# Patient Record
Sex: Female | Born: 1990 | Race: White | Hispanic: No | Marital: Married | Smoking: Never smoker
Health system: Southern US, Community
[De-identification: ages and names within clinical notes are randomized; demographics above are authoritative.]

## PROBLEM LIST (undated history)

## (undated) ENCOUNTER — Inpatient Hospital Stay: Payer: Self-pay

## (undated) DIAGNOSIS — J45909 Unspecified asthma, uncomplicated: Secondary | ICD-10-CM

## (undated) DIAGNOSIS — Z8614 Personal history of Methicillin resistant Staphylococcus aureus infection: Secondary | ICD-10-CM

## (undated) DIAGNOSIS — R112 Nausea with vomiting, unspecified: Secondary | ICD-10-CM

## (undated) DIAGNOSIS — D649 Anemia, unspecified: Secondary | ICD-10-CM

## (undated) DIAGNOSIS — R87629 Unspecified abnormal cytological findings in specimens from vagina: Secondary | ICD-10-CM

## (undated) DIAGNOSIS — Z9889 Other specified postprocedural states: Secondary | ICD-10-CM

## (undated) HISTORY — DX: Unspecified abnormal cytological findings in specimens from vagina: R87.629

## (undated) HISTORY — DX: Anemia, unspecified: D64.9

---

## 2011-02-12 ENCOUNTER — Emergency Department: Payer: Self-pay | Admitting: Unknown Physician Specialty

## 2012-04-28 ENCOUNTER — Emergency Department: Payer: Self-pay | Admitting: Emergency Medicine

## 2012-04-28 LAB — CBC
HGB: 12.3 g/dL (ref 12.0–16.0)
MCH: 25.7 pg — ABNORMAL LOW (ref 26.0–34.0)
MCHC: 32.1 g/dL (ref 32.0–36.0)
MCV: 80 fL (ref 80–100)
Platelet: 327 10*3/uL (ref 150–440)
RBC: 4.8 10*6/uL (ref 3.80–5.20)
RDW: 14 % (ref 11.5–14.5)

## 2012-04-28 LAB — URINALYSIS, COMPLETE
Bacteria: NONE SEEN
Bilirubin,UR: NEGATIVE
Glucose,UR: NEGATIVE mg/dL (ref 0–75)
Ketone: NEGATIVE
Leukocyte Esterase: NEGATIVE
Ph: 8 (ref 4.5–8.0)
RBC,UR: <1 /HPF (ref 0–5)
Squamous Epithelial: 1
WBC UR: NONE SEEN /HPF (ref 0–5)

## 2012-04-28 LAB — BASIC METABOLIC PANEL
Anion Gap: 7 (ref 7–16)
BUN: 8 mg/dL (ref 7–18)
Co2: 29 mmol/L (ref 21–32)
EGFR (Non-African Amer.): >60
Glucose: 82 mg/dL (ref 65–99)
Potassium: 4.1 mmol/L (ref 3.5–5.1)
Sodium: 142 mmol/L (ref 136–145)

## 2012-04-28 LAB — PREGNANCY, URINE: Pregnancy Test, Urine: NEGATIVE m[IU]/mL

## 2013-06-09 ENCOUNTER — Emergency Department: Payer: Self-pay | Admitting: Internal Medicine

## 2013-06-09 LAB — URINALYSIS, COMPLETE
Bacteria: NONE SEEN
Bilirubin,UR: NEGATIVE
Ketone: NEGATIVE
Nitrite: NEGATIVE
Ph: 5 (ref 4.5–8.0)
RBC,UR: 1 /HPF (ref 0–5)
WBC UR: 4 /HPF (ref 0–5)

## 2013-06-09 LAB — GC/CHLAMYDIA PROBE AMP

## 2013-06-09 LAB — WET PREP, GENITAL

## 2016-09-17 ENCOUNTER — Emergency Department
Admission: EM | Admit: 2016-09-17 | Discharge: 2016-09-17 | Disposition: A | Discharge status: Home / Self Care | Payer: Self-pay | Attending: Emergency Medicine | Admitting: Emergency Medicine

## 2016-09-17 ENCOUNTER — Encounter: Payer: Self-pay | Admitting: Emergency Medicine

## 2016-09-17 DIAGNOSIS — N898 Other specified noninflammatory disorders of vagina: Secondary | ICD-10-CM | POA: Insufficient documentation

## 2016-09-17 DIAGNOSIS — L298 Other pruritus: Secondary | ICD-10-CM | POA: Insufficient documentation

## 2016-09-17 DIAGNOSIS — Z7722 Contact with and (suspected) exposure to environmental tobacco smoke (acute) (chronic): Secondary | ICD-10-CM | POA: Insufficient documentation

## 2016-09-17 DIAGNOSIS — R300 Dysuria: Secondary | ICD-10-CM | POA: Insufficient documentation

## 2016-09-17 LAB — WET PREP, GENITAL
Clue Cells Wet Prep HPF POC: NONE SEEN
Sperm: NONE SEEN
Trich, Wet Prep: NONE SEEN
Yeast Wet Prep HPF POC: NONE SEEN

## 2016-09-17 LAB — URINALYSIS, COMPLETE (UACMP) WITH MICROSCOPIC
Bilirubin Urine: NEGATIVE
Glucose, UA: NEGATIVE mg/dL
Hgb urine dipstick: NEGATIVE
Ketones, ur: NEGATIVE mg/dL
Nitrite: NEGATIVE
Protein, ur: 30 mg/dL — AB
Specific Gravity, Urine: 1.021 (ref 1.005–1.030)
pH: 7 (ref 5.0–8.0)

## 2016-09-17 LAB — PREGNANCY, URINE: Preg Test, Ur: NEGATIVE

## 2016-09-17 MED ORDER — CEPHALEXIN 500 MG PO CAPS: 500.0000 mg | ORAL_CAPSULE | Freq: Four times a day (QID) | ORAL | 0 refills | Status: AC

## 2016-09-17 MED ORDER — FLUCONAZOLE 50 MG PO TABS
150.0000 mg | ORAL_TABLET | Freq: Once | ORAL | Status: AC
  Administered 2016-09-17: 22:00:00 150 mg via ORAL
  Filled 2016-09-17: qty 1

## 2016-09-17 NOTE — ED Notes (Signed)
Patient c/o thick white vaginal discharge and pain

## 2016-09-17 NOTE — ED Notes (Signed)
Reviewed d/c instructions, follow-up care, prescription with patient. Pt verbalized understanding.  

## 2016-09-17 NOTE — ED Provider Notes (Signed)
[facility] Emergency Department Provider Note  ____________________________________________  Time seen: Approximately 8:27 PM  I have reviewed the triage vital signs and the nursing notes.   HISTORY  Chief Complaint Vaginal Discharge    HPI Rebecca Walton is a 26 y.o. female presenting to the emergency department with thick, white vaginal discharge and vaginal pruritus for one day. Patient has also had dysuria but no increased urinary frequency or hematuria. Patient states that she was treated for a STD in the past. However, she is unsure if she actually tested positive. Patient states that she is in a monogamous relationship with one sexual partner now. She denies a history of pyelonephritis or nephrolithiasis. She denies bilateral flank pain and dyspareunia. No alleviating measures have been attempted. Patient states "I do not want to be tested for STDs". Patient denies chest pain, chest tightness, shortness of breath, abdominal pain, nausea and vomiting.    History reviewed. No pertinent past medical history.  There are no active problems to display for this patient.   History reviewed. No pertinent surgical history.  Prior to Admission medications   Medication Sig Start Date End Date Taking? Authorizing Provider  cephALEXin (KEFLEX) 500 MG capsule Take 1 capsule (500 mg total) by mouth 4 (four) times daily. 09/17/16 09/27/16  Orvil FeilJaclyn M Audreana Hancox, PA-C    Allergies Patient has no known allergies.  History reviewed. No pertinent family history.  Social History Social History  Substance Use Topics  . Smoking status: Passive Smoke Exposure - Never Smoker  . Smokeless tobacco: Never Used  . Alcohol use No     Review of Systems  Constitutional: No fever/chills Eyes: No visual changes. No discharge ENT: No upper respiratory complaints. Cardiovascular: no chest pain. Respiratory: no cough. No SOB. Gastrointestinal: No abdominal pain.  No nausea, no  vomiting.  No diarrhea.  No constipation. Genitourinary: Patient has increased vaginal discharge and dysuria. Musculoskeletal: Negative for musculoskeletal pain. Skin: Negative for rash, abrasions, lacerations, ecchymosis. Neurological: Negative for headaches, focal weakness or numbness. ____________________________________________   PHYSICAL EXAM:  VITAL SIGNS: ED Triage Vitals [09/17/16 1805]  Enc Vitals Group     BP (!) 142/70     Pulse Rate 69     Resp 18     Temp 98.8 F (37.1 C)     Temp Source Oral     SpO2 100 %     Weight 180 lb (81.6 kg)     Height 5' (1.524 m)     Head Circumference      Peak Flow      Pain Score 8     Pain Loc      Pain Edu?      Excl. in GC?      Constitutional: Alert and oriented. Well appearing and in no acute distress. Cardiovascular: Normal rate, regular rhythm. Normal S1 and S2.  Good peripheral circulation. Respiratory: Normal respiratory effort without tachypnea or retractions. Lungs CTAB. Good air entry to the bases with no decreased or absent breath sounds. Gastrointestinal: Bowel sounds 4 quadrants. Soft and nontender to palpation. No guarding or rigidity. No palpable masses. No distention. No CVA tenderness. Genital: Patient has erythema, excoriations and satellite lesions of the labia majora. Thick, white discharge is visible within the vaginal vault. No masses were palpated with bimanual exam. No cervical motion tenderness was elicited. Musculoskeletal: Full range of motion to all extremities. No gross deformities appreciated. Neurologic:  Normal speech and language. No gross focal neurologic deficits are appreciated.  Skin:  Skin is warm, dry and intact. No rash noted. Psychiatric: Mood and affect are normal. Speech and behavior are normal. Patient exhibits appropriate insight and judgement. ____________________________________________   LABS (all labs ordered are listed, but only abnormal results are displayed)  Labs Reviewed   WET PREP, GENITAL - Abnormal; Notable for the following:       Result Value   WBC, Wet Prep HPF POC RARE (*)    All other components within normal limits  URINALYSIS, COMPLETE (UACMP) WITH MICROSCOPIC - Abnormal; Notable for the following:    Color, Urine YELLOW (*)    APPearance HAZY (*)    Protein, ur 30 (*)    Leukocytes, UA SMALL (*)    Bacteria, UA RARE (*)    Squamous Epithelial / LPF 6-30 (*)    All other components within normal limits  PREGNANCY, URINE   ____________________________________________  EKG   ____________________________________________  RADIOLOGY   No results found.  ____________________________________________    PROCEDURES  Procedure(s) performed:    Procedures    Medications  fluconazole (DIFLUCAN) tablet 150 mg (150 mg Oral Given 09/17/16 2141)     ____________________________________________   INITIAL IMPRESSION / ASSESSMENT AND PLAN / ED COURSE  Pertinent labs & imaging results that were available during my care of the patient were reviewed by me and considered in my medical decision making (see chart for details).  Review of the [place] CSRS was performed in accordance of the [facility] prior to dispensing any controlled drugs.     Assessment and Plan:  Dysuria Vaginal Itching Patient presents to the emergency department with increased white vaginal discharge, vaginal pruritus and dysuria. Wet prep was noncontributory for vaginal candidiasis or bacterial vaginosis. On physical exam, patient has erythema and excoriations of the labia major with satellite lesions and with white vaginal discharge, findings consistent with vaginal candidiasis. Patient was treated empirically with Diflucan in the emergency department. Patient was also treated empirically for a urinary tract infection. Patient was advised to follow-up with the health department for appropriate STDs testing. Patient declined gonorrhea and chlamydia testing in the emergency  department tonight. Patient declined empiric treatment for gonorrhea, chlamydia and trichomoniasis tonight. All patient questions were answered. Vital signs are reassuring at this time. ____________________________________________  FINAL CLINICAL IMPRESSION(S) / ED DIAGNOSES  Final diagnoses:  Dysuria  Vaginal itching      NEW MEDICATIONS STARTED DURING THIS VISIT:  Discharge Medication List as of 09/17/2016  9:33 PM    START taking these medications   Details  cephALEXin (KEFLEX) 500 MG capsule Take 1 capsule (500 mg total) by mouth 4 (four) times daily., Starting Sat 09/17/2016, Until Tue 09/27/2016, Print            This chart was dictated using voice recognition software/Dragon. Despite best efforts to proofread, errors can occur which can change the meaning. Any change was purely unintentional.    Orvil Feil, PA-C 09/17/16 2232    Merrily Brittle, MD 09/17/16 2350

## 2016-09-17 NOTE — ED Triage Notes (Signed)
Pt states she has a bacterial infection in her vagina. Reports itching and discharged. Started about 2 days ago.

## 2016-11-25 ENCOUNTER — Other Ambulatory Visit: Payer: Self-pay | Admitting: Nurse Practitioner

## 2016-11-25 DIAGNOSIS — Z369 Encounter for antenatal screening, unspecified: Secondary | ICD-10-CM

## 2016-11-25 LAB — OB RESULTS CONSOLE HIV ANTIBODY (ROUTINE TESTING): HIV: NONREACTIVE

## 2016-11-26 LAB — OB RESULTS CONSOLE VARICELLA ZOSTER ANTIBODY, IGG: Varicella: IMMUNE

## 2016-11-26 LAB — OB RESULTS CONSOLE HEPATITIS B SURFACE ANTIGEN: Hepatitis B Surface Ag: NEGATIVE

## 2016-11-26 LAB — OB RESULTS CONSOLE RUBELLA ANTIBODY, IGM: Rubella: IMMUNE

## 2016-11-26 LAB — OB RESULTS CONSOLE RPR: RPR: NONREACTIVE

## 2016-12-08 ENCOUNTER — Ambulatory Visit
Admission: RE | Admit: 2016-12-08 | Discharge: 2016-12-08 | Disposition: A | Discharge status: Home / Self Care | Payer: Self-pay | Source: Ambulatory Visit | Attending: Obstetrics and Gynecology | Admitting: Obstetrics and Gynecology

## 2016-12-08 ENCOUNTER — Ambulatory Visit
Admission: RE | Admit: 2016-12-08 | Discharge: 2016-12-08 | Disposition: A | Discharge status: Home / Self Care | Payer: Medicaid Other | Source: Ambulatory Visit | Attending: Obstetrics and Gynecology | Admitting: Obstetrics and Gynecology

## 2016-12-08 ENCOUNTER — Encounter: Payer: Self-pay | Admitting: *Deleted

## 2016-12-08 VITALS — BP 118/73 | HR 84 | Temp 98.5°F | Resp 18 | Ht 60.0 in | Wt 176.8 lb

## 2016-12-08 DIAGNOSIS — Z369 Encounter for antenatal screening, unspecified: Secondary | ICD-10-CM

## 2016-12-08 DIAGNOSIS — Z3A12 12 weeks gestation of pregnancy: Secondary | ICD-10-CM | POA: Insufficient documentation

## 2016-12-08 DIAGNOSIS — Z3682 Encounter for antenatal screening for nuchal translucency: Secondary | ICD-10-CM | POA: Diagnosis not present

## 2016-12-08 NOTE — Progress Notes (Addendum)
Referring physician:  [facility] Department Length of Consultation: 45 minutes   [name]  was referred to Rebecca Walton for genetic counseling to review prenatal screening and testing options.  This note summarizes the information we discussed.    We offered the following routine screening tests for this pregnancy:  First trimester screening, which includes nuchal translucency ultrasound screen and first trimester maternal serum marker screening.  The nuchal translucency has approximately an 80% detection rate for Down syndrome and can be positive for other chromosome abnormalities as well as congenital heart defects.  When combined with a maternal serum marker screening, the detection rate is up to 90% for Down syndrome and up to 97% for trisomy 18.     Maternal serum marker screening, a blood test that measures pregnancy proteins, can provide risk assessments for Down syndrome, trisomy 18, and open neural tube defects (spina bifida, anencephaly). Because it does not directly examine the fetus, it cannot positively diagnose or rule out these problems.  Targeted ultrasound uses high frequency sound waves to create an image of the developing fetus.  An ultrasound is often recommended as a routine means of evaluating the pregnancy.  It is also used to screen for fetal anatomy problems (for example, a heart defect) that might be suggestive of a chromosomal or other abnormality.   Should these screening tests indicate an increased concern, then the following additional testing options would be offered:  The chorionic villus sampling procedure is available for first trimester chromosome analysis.  This involves the withdrawal of a small amount of chorionic villi (tissue from the developing placenta).  Risk of pregnancy loss is estimated to be approximately 1 in 200 to 1 in 100 (0.5 to 1%).  There is approximately a 1% (1 in 100) chance that the CVS  chromosome results will be unclear.  Chorionic villi cannot be tested for neural tube defects.     Amniocentesis involves the removal of a small amount of amniotic fluid from the sac surrounding the fetus with the use of a thin needle inserted through the maternal abdomen and uterus.  Ultrasound guidance is used throughout the procedure.  Fetal cells from amniotic fluid are directly evaluated and > 99.5% of chromosome problems and > 98% of open neural tube defects can be detected. This procedure is generally performed after the 15th week of pregnancy.  The main risks to this procedure include complications leading to miscarriage in less than 1 in 200 cases (0.5%).  As another option for information if the pregnancy is suspected to be an an increased chance for certain chromosome conditions, we also reviewed the availability of cell free fetal DNA testing from maternal blood to determine whether or not the baby may have either Down syndrome, trisomy 4, or trisomy 53.  This test utilizes a maternal blood sample and DNA sequencing technology to isolate circulating cell free fetal DNA from maternal plasma.  The fetal DNA can then be analyzed for DNA sequences that are derived from the three most common chromosomes involved in aneuploidy, chromosomes 13, 18, and 21.  If the overall amount of DNA is greater than the expected level for any of these chromosomes, aneuploidy is suspected.  While we do not consider it a replacement for invasive testing and karyotype analysis, a negative result from this testing would be reassuring, though not a guarantee of a normal chromosome complement for the baby.  An abnormal result is certainly suggestive of an abnormal chromosome  complement, though we would still recommend CVS or amniocentesis to confirm any findings from this testing.  Cystic Fibrosis and Spinal Muscular Atrophy (SMA) screening were also discussed with the patient. Both conditions are recessive, which means that  both parents must be carriers in order to have a child with the disease.  Cystic fibrosis (CF) is one of the most common genetic conditions in persons of Caucasian ancestry.  This condition occurs in approximately 1 in 2,500 Caucasian persons and results in thickened secretions in the lungs, digestive, and reproductive systems.  For a baby to be at risk for having CF, both of the parents must be carriers for this condition.  Approximately 1 in 62 Caucasian persons is a carrier for CF.  Current carrier testing looks for the most common mutations in the gene for CF and can detect approximately 90% of carriers in the Caucasian population.  This means that the carrier screening can greatly reduce, but cannot eliminate, the chance for an individual to have a child with CF.  If an individual is found to be a carrier for CF, then carrier testing would be available for the partner. As part of [place]'s newborn screening profile, all babies born in the state of [place] will have a two-tier screening process.  Specimens are first tested to determine the concentration of immunoreactive trypsinogen (IRT).  The top 5% of specimens with the highest IRT values then undergo DNA testing using a panel of over 40 common CF mutations. SMA is a neurodegenerative disorder that leads to atrophy of skeletal muscle and overall weakness.  This condition is also more prevalent in the Caucasian population, with 1 in 40-1 in 60 persons being a carrier and 1 in 6,000-1 in 10,000 children being affected.  There are multiple forms of the disease, with some causing death in infancy to other forms with survival into adulthood.  The genetics of SMA is complex, but carrier screening can detect up to 95% of carriers in the Caucasian population.  Similar to CF, a negative result can greatly reduce, but cannot eliminate, the chance to have a child with SMA.  We obtained a detailed family history and pregnancy history.  The father of the  baby's mother stated that she had a daughter (a full sibling to the FOB) who passed away at 25 years of age due to an aneurism in the vein of [place].  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  The cause for this condition is not well understood, though there are some families in which such vascular abnormalities are thought to be inherited.  No other relatives have a similar condition, though we cannot rule out a genetic cause without additional medical information.  The patient has a maternal aunt with seizures and developmental delays due to cerebral palsy. We reviewed that there may be various conditions, including some genetic syndromes which are similar to cerebral palsy, but if this is the accurate diagnosis, it would not be expected to have an increased chance for recurrence because it is the result of lack of oxygen at the time of delivery.  Lastly, the patient has an identical twin sister who had surgery as a baby for a congenital heart defect.  We discussed that congenital heart defects (CHDs) occur in approximately 1 in 200 births.  Congenital heart defects are often thought to be multifactorial, meaning due to complex interactions between genetic and environmental factors, although some specific genetic changes and syndromes are associated  with congenital heart defects.  In the absence of an underlying genetic condition, the chance of congenital heart defects in this pregnancy is thought to be approximately 3-5%.  For this reason, it would be reasonable to consider a fetal echocardiogram at 22 to 24 weeks in this and subsequent pregnancies for this couple.  The remainder of the family history was reported to be unremarkable for birth defects, intellectual delays, recurrent pregnancy loss or known chromosome abnormalities.  [name] stated that this is her first pregnancy.  She reported no complications or exposures in this pregnancy that would be expected to  increase the risk for birth defects.  After consideration of the options, [name] elected to proceed with first trimester screening.  She declined CF and SMA carrier screening.  An ultrasound was performed at the time of the visit.  The gestational age was consistent with  12 weeks.  Fetal anatomy could not be assessed due to early gestational age.  Please refer to the ultrasound report for details of that study.  [name] was encouraged to call with questions or concerns.  We can be contacted at [phone].    Cherly Andersoneborah F. Wells, MS, CGC  I saw the pt and reviewed the ultrasound  and the testing plan   Jimmey RalphLivingston, Bobbyjoe Pabst, MD

## 2016-12-19 ENCOUNTER — Telehealth: Payer: Self-pay | Admitting: Obstetrics and Gynecology

## 2016-12-19 NOTE — Telephone Encounter (Signed)
  [name] elected to undergo First Trimester screening on 12/08/2016.  To review, first trimester screening, includes nuchal translucency ultrasound screen and/or first trimester maternal serum marker screening.  The nuchal translucency has approximately an 80% detection rate for Down syndrome and can be positive for other chromosome abnormalities as well as heart defects.  When combined with a maternal serum marker screening, the detection rate is up to 90% for Down syndrome and up to 97% for trisomy 13 and 18.     The results of the First Trimester Nuchal Translucency and Biochemical Screening were within normal range.  The risk for Down syndrome is now estimated to be less than 1 in 10,000.  The risk for Trisomy 13/18 is also estimated to be less than 1 in 10,000.  Should more definitive information be desired, we would offer amniocentesis.  Because we do not yet know the effectiveness of combined first and second trimester screening, we do not recommend a maternal serum screen to assess the chance for chromosome conditions.  However, if screening for neural tube defects is desired, maternal serum screening for AFP only can be performed between 15 and [redacted] weeks gestation.    We were unable to reach [name] by phone, but left a message at her contact number for her to follow up with her OB regarding lab results if she had any questions.  Cherly Andersoneborah F. Horacio Werth, MS, CGC

## 2017-01-19 ENCOUNTER — Other Ambulatory Visit: Payer: Self-pay | Admitting: *Deleted

## 2017-01-19 DIAGNOSIS — Z3689 Encounter for other specified antenatal screening: Secondary | ICD-10-CM

## 2017-01-23 ENCOUNTER — Other Ambulatory Visit: Payer: Self-pay

## 2017-01-26 ENCOUNTER — Ambulatory Visit
Admission: RE | Admit: 2017-01-26 | Discharge: 2017-01-26 | Disposition: A | Discharge status: Home / Self Care | Payer: Medicaid Other | Source: Ambulatory Visit | Attending: Obstetrics and Gynecology | Admitting: Obstetrics and Gynecology

## 2017-01-26 DIAGNOSIS — O99212 Obesity complicating pregnancy, second trimester: Secondary | ICD-10-CM | POA: Diagnosis not present

## 2017-01-26 DIAGNOSIS — Z6834 Body mass index (BMI) 34.0-34.9, adult: Secondary | ICD-10-CM | POA: Insufficient documentation

## 2017-01-26 DIAGNOSIS — Z3A19 19 weeks gestation of pregnancy: Secondary | ICD-10-CM | POA: Diagnosis not present

## 2017-01-26 DIAGNOSIS — Z3689 Encounter for other specified antenatal screening: Secondary | ICD-10-CM | POA: Diagnosis present

## 2017-01-26 DIAGNOSIS — E669 Obesity, unspecified: Secondary | ICD-10-CM | POA: Insufficient documentation

## 2017-03-24 LAB — HM HIV SCREENING LAB: HM HIV Screening: NEGATIVE

## 2017-03-25 LAB — OB RESULTS CONSOLE RPR: RPR: NONREACTIVE

## 2017-05-22 ENCOUNTER — Other Ambulatory Visit: Payer: Self-pay | Admitting: Obstetrics & Gynecology

## 2017-05-22 DIAGNOSIS — O3663X0 Maternal care for excessive fetal growth, third trimester, not applicable or unspecified: Secondary | ICD-10-CM

## 2017-05-23 ENCOUNTER — Ambulatory Visit (INDEPENDENT_AMBULATORY_CARE_PROVIDER_SITE_OTHER): Payer: Medicaid Other

## 2017-05-23 DIAGNOSIS — O3663X0 Maternal care for excessive fetal growth, third trimester, not applicable or unspecified: Secondary | ICD-10-CM | POA: Diagnosis not present

## 2017-05-28 LAB — OB RESULTS CONSOLE GBS: STREP GROUP B AG: NEGATIVE

## 2017-06-15 ENCOUNTER — Observation Stay
Admission: EM | Admit: 2017-06-15 | Discharge: 2017-06-15 | Disposition: A | Discharge status: Home / Self Care | Payer: Medicaid Other | Attending: Obstetrics and Gynecology | Admitting: Obstetrics and Gynecology

## 2017-06-15 ENCOUNTER — Encounter: Payer: Self-pay | Admitting: *Deleted

## 2017-06-15 DIAGNOSIS — Z3A39 39 weeks gestation of pregnancy: Secondary | ICD-10-CM | POA: Diagnosis not present

## 2017-06-15 DIAGNOSIS — Z7982 Long term (current) use of aspirin: Secondary | ICD-10-CM | POA: Insufficient documentation

## 2017-06-15 DIAGNOSIS — E669 Obesity, unspecified: Secondary | ICD-10-CM | POA: Insufficient documentation

## 2017-06-15 DIAGNOSIS — O99213 Obesity complicating pregnancy, third trimester: Secondary | ICD-10-CM | POA: Insufficient documentation

## 2017-06-15 DIAGNOSIS — O479 False labor, unspecified: Principal | ICD-10-CM | POA: Insufficient documentation

## 2017-06-15 DIAGNOSIS — Z6837 Body mass index (BMI) 37.0-37.9, adult: Secondary | ICD-10-CM | POA: Insufficient documentation

## 2017-06-15 DIAGNOSIS — O26893 Other specified pregnancy related conditions, third trimester: Secondary | ICD-10-CM | POA: Diagnosis not present

## 2017-06-15 DIAGNOSIS — O471 False labor at or after 37 completed weeks of gestation: Secondary | ICD-10-CM | POA: Diagnosis present

## 2017-06-15 DIAGNOSIS — R109 Unspecified abdominal pain: Secondary | ICD-10-CM | POA: Diagnosis not present

## 2017-06-15 NOTE — H&P (Signed)
Physician Final Progress Note  Patient ID: Rebecca Walton MRN: [id number] DOB/AGE: 25/04/1991 26 y.o.  Admit date: 06/15/2017 Admitting provider: Chaos Carlile D Deuntae Kocsis, MD Discharge date: 06/15/2017   Admission Diagnoses:  1) intrauterine pregnancy at [redacted]w[redacted]d 2) abdominal pain  Discharge Diagnoses:  1) intrauterine pregnancy at [redacted]w[redacted]d 2) abdominal pain, false labor  History of Present Illness: The patient is a 26 y.o. female G1P0 at [redacted]w[redacted]d by a 17 week ultrasound. Pregnancy complicated by obesity.  She presents for contractions since 11 pm last night.  She notes +FM, no LOF, no vaginal bleeding.   Hospital Course: Admitted for observation for the above.  Fetal tracing reactive.  Cervix 0.5 cm and patient not feeling contractions.  Vitals normal and stable. Discharged home in stable condition with usual precautions. Instructed to keep follow up appointment.      Past Medical History:  Diagnosis Date  . Medical history non-contributory    Past Surgical History: denies   No current facility-administered medications on file prior to encounter.          Current Outpatient Medications on File Prior to Encounter  Medication Sig Dispense Refill  . ferrous sulfate 325 (65 FE) MG EC tablet Take 325 mg by mouth 3 (three) times daily with meals.    . Prenatal Vit-Fe Fumarate-FA (MULTIVITAMIN-PRENATAL) 27-0.8 MG TABS tablet Take 1 tablet by mouth daily at 12 noon.    . aspirin EC 81 MG tablet Take 81 mg by mouth daily.          Allergies  Allergen Reactions  . Coconut Flavor     Social History        Socioeconomic History  . Marital status: Single    Spouse name: Not on file  . Number of children: Not on file  . Years of education: Not on file  . Highest education level: Not on file  Social Needs  . Financial resource strain: Not on file  . Food insecurity - worry: Not on file  . Food insecurity - inability: Not on file  . Transportation needs -  medical: Not on file  . Transportation needs - non-medical: Not on file  Occupational History  . Not on file  Tobacco Use  . Smoking status: Passive Smoke Exposure - Never Smoker  . Smokeless tobacco: Never Used  Substance and Sexual Activity  . Alcohol use: No  . Drug use: No  . Sexual activity: Yes  Other Topics Concern  . Not on file  Social History Narrative  . Not on file    Physical Exam: BP 116/65 (BP Location: Right Arm)   Pulse 100   Temp 97.9 F (36.6 C) (Oral)   Resp 16   Ht 5' (1.524 m)   Wt 192 lb (87.1 kg)   LMP 09/03/2016 (Exact Date)   BMI 37.50 kg/m   Gen: NAD CV: RRR Pulm: CTAB Pelvic: 0.5 cm per RN Ext: no E/C/T  Consults: None  Significant Findings/ Diagnostic Studies: none  Procedures: NST Baseline FHR: 130 beats/min Variability: moderate Accelerations: present Decelerations: absent Tocometry: 2-3 q 10 min  Interpretation:  INDICATIONS: rule out uterine contractions RESULTS:  A NST procedure was performed with FHR monitoring and a normal baseline established, appropriate time of 20-40 minutes of evaluation, and accels >2 seen w 15x15 characteristics.  Results show a REACTIVE NST.    Discharge Condition: stable  Disposition: 01-Home or Self Care  Diet: Regular diet  Discharge Activity: Activity as tolerated        Allergies as of 06/15/2017      Reactions   Coconut Flavor           Medication List     TAKE these medications   aspirin EC 81 MG tablet Take 81 mg by mouth daily.   ferrous sulfate 325 (65 FE) MG EC tablet Take 325 mg by mouth 3 (three) times daily with meals.   multivitamin-prenatal 27-0.8 MG Tabs tablet Take 1 tablet by mouth daily at 12 noon.      Total time spent taking care of this patient: 20 minutes  Signed: Kashish Yglesias, MD  06/15/2017, 3:09 AM    

## 2017-06-15 NOTE — Discharge Summary (Signed)
See final progress note. 

## 2017-06-15 NOTE — Discharge Instructions (Signed)
Come back if:  Big gush of fluids Decreased fetal movement Heavy vaginal bleeding Temp over 100.4 Contractions every 3-5 min lasting at least one hour  Get plenty of rest and stay well hydrated!  Go to next scheduled appt

## 2017-06-15 NOTE — Final Progress Note (Signed)
Physician Final Progress Note  Patient ID: Rebecca QuintLisa M Horacek MRN: [id number] DOB/AGE: 1991/06/22 26 y.o.  Admit date: 06/15/2017 Admitting provider: Conard NovakStephen D Citlalli Weikel, MD Discharge date: 06/15/2017   Admission Diagnoses:  1) intrauterine pregnancy at 6869w1d 2) abdominal pain  Discharge Diagnoses:  1) intrauterine pregnancy at 5169w1d 2) abdominal pain, false labor  History of Present Illness: The patient is a 26 y.o. female G1P0 at 4869w1d by a 17 week ultrasound. Pregnancy complicated by obesity.  She presents for contractions since 11 pm last night.  She notes +FM, no LOF, no vaginal bleeding.   Hospital Course: Admitted for observation for the above.  Fetal tracing reactive.  Cervix 0.5 cm and patient not feeling contractions.  Vitals normal and stable. Discharged home in stable condition with usual precautions. Instructed to keep follow up appointment.      Past Medical History:  Diagnosis Date  . Medical history non-contributory    Past Surgical History: denies   No current facility-administered medications on file prior to encounter.          Current Outpatient Medications on File Prior to Encounter  Medication Sig Dispense Refill  . ferrous sulfate 325 (65 FE) MG EC tablet Take 325 mg by mouth 3 (three) times daily with meals.    . Prenatal Vit-Fe Fumarate-FA (MULTIVITAMIN-PRENATAL) 27-0.8 MG TABS tablet Take 1 tablet by mouth daily at 12 noon.    Marland Kitchen. aspirin EC 81 MG tablet Take 81 mg by mouth daily.          Allergies  Allergen Reactions  . Coconut Flavor     Social History        Socioeconomic History  . Marital status: Single    Spouse name: Not on file  . Number of children: Not on file  . Years of education: Not on file  . Highest education level: Not on file  Social Needs  . Financial resource strain: Not on file  . Food insecurity - worry: Not on file  . Food insecurity - inability: Not on file  . Transportation needs -  medical: Not on file  . Transportation needs - non-medical: Not on file  Occupational History  . Not on file  Tobacco Use  . Smoking status: Passive Smoke Exposure - Never Smoker  . Smokeless tobacco: Never Used  Substance and Sexual Activity  . Alcohol use: No  . Drug use: No  . Sexual activity: Yes  Other Topics Concern  . Not on file  Social History Narrative  . Not on file    Physical Exam: BP 116/65 (BP Location: Right Arm)   Pulse 100   Temp 97.9 F (36.6 C) (Oral)   Resp 16   Ht 5' (1.524 m)   Wt 192 lb (87.1 kg)   LMP 09/03/2016 (Exact Date)   BMI 37.50 kg/m   Gen: NAD CV: RRR Pulm: CTAB Pelvic: 0.5 cm per RN Ext: no E/C/T  Consults: None  Significant Findings/ Diagnostic Studies: none  Procedures: NST Baseline FHR: 130 beats/min Variability: moderate Accelerations: present Decelerations: absent Tocometry: 2-3 q 10 min  Interpretation:  INDICATIONS: rule out uterine contractions RESULTS:  A NST procedure was performed with FHR monitoring and a normal baseline established, appropriate time of 20-40 minutes of evaluation, and accels >2 seen w 15x15 characteristics.  Results show a REACTIVE NST.    Discharge Condition: stable  Disposition: 01-Home or Self Care  Diet: Regular diet  Discharge Activity: Activity as tolerated  Allergies as of 06/15/2017      Reactions   Coconut Flavor           Medication List     TAKE these medications   aspirin EC 81 MG tablet Take 81 mg by mouth daily.   ferrous sulfate 325 (65 FE) MG EC tablet Take 325 mg by mouth 3 (three) times daily with meals.   multivitamin-prenatal 27-0.8 MG Tabs tablet Take 1 tablet by mouth daily at 12 noon.      Total time spent taking care of this patient: 20 minutes  Signed: Thomasene MohairStephen Xian Alves, MD  06/15/2017, 3:09 AM

## 2017-06-15 NOTE — OB Triage Note (Signed)
Recvd pt from ED. Pt c/o contractions that started around 2300 but is unable to tell me how far apart. No LOF or vaginal bleeding and has not had intercourse in the past 24 hours. Rates pain an 8 out of 10. Feeling baby move well.

## 2017-06-28 ENCOUNTER — Observation Stay
Admission: RE | Admit: 2017-06-28 | Discharge: 2017-06-28 | Disposition: A | Discharge status: Home / Self Care | Payer: Medicaid Other | Source: Home / Self Care | Attending: Obstetrics and Gynecology | Admitting: Obstetrics and Gynecology

## 2017-06-28 DIAGNOSIS — O471 False labor at or after 37 completed weeks of gestation: Secondary | ICD-10-CM | POA: Insufficient documentation

## 2017-06-28 DIAGNOSIS — Z3A00 Weeks of gestation of pregnancy not specified: Secondary | ICD-10-CM | POA: Insufficient documentation

## 2017-06-28 DIAGNOSIS — Z3689 Encounter for other specified antenatal screening: Secondary | ICD-10-CM

## 2017-06-28 NOTE — Discharge Summary (Signed)
See Final Progress Note 06/28/2017.  Rebecca Walton, CNM 06/28/2017  3:19 PM

## 2017-06-28 NOTE — Progress Notes (Signed)
Pt. Being seen for NST for induction in the morning. Monitors applied and assessing. Vital signs normal. Patient has no complaints of pain. Will continue to monitor.

## 2017-06-28 NOTE — Discharge Instructions (Signed)
See you in the morning for induction.

## 2017-06-28 NOTE — Final Progress Note (Signed)
Patient presented for NST today.  I reviewed her vital signs and fetal tracing, both of which were reassuring.  Patient was discharged as her NST was reactive and she was not laboring. She will present for induction of labor on 06/29/2017.  Marcelyn BruinsJacelyn Korryn Pancoast, CNM 06/28/2017  3:18 PM

## 2017-06-29 ENCOUNTER — Encounter: Payer: Self-pay | Admitting: Maternal Newborn

## 2017-06-29 ENCOUNTER — Other Ambulatory Visit: Payer: Self-pay

## 2017-06-29 ENCOUNTER — Inpatient Hospital Stay
Admission: AD | Admit: 2017-06-29 | Discharge: 2017-07-03 | DRG: 786 | Disposition: A | Discharge status: Home / Self Care | Payer: Medicaid Other | Source: Ambulatory Visit | Attending: Obstetrics and Gynecology | Admitting: Obstetrics and Gynecology

## 2017-06-29 ENCOUNTER — Encounter
Admission: AD | Disposition: A | Discharge status: Home / Self Care | Payer: Self-pay | Source: Ambulatory Visit | Attending: Obstetrics and Gynecology

## 2017-06-29 DIAGNOSIS — Z3A41 41 weeks gestation of pregnancy: Secondary | ICD-10-CM

## 2017-06-29 DIAGNOSIS — O9081 Anemia of the puerperium: Secondary | ICD-10-CM | POA: Diagnosis not present

## 2017-06-29 DIAGNOSIS — Z3490 Encounter for supervision of normal pregnancy, unspecified, unspecified trimester: Secondary | ICD-10-CM | POA: Diagnosis present

## 2017-06-29 DIAGNOSIS — D62 Acute posthemorrhagic anemia: Secondary | ICD-10-CM | POA: Diagnosis not present

## 2017-06-29 DIAGNOSIS — O480 Post-term pregnancy: Secondary | ICD-10-CM | POA: Diagnosis present

## 2017-06-29 DIAGNOSIS — O411230 Chorioamnionitis, third trimester, not applicable or unspecified: Secondary | ICD-10-CM | POA: Diagnosis present

## 2017-06-29 LAB — CBC
HCT: 33.9 % — ABNORMAL LOW (ref 35.0–47.0)
Hemoglobin: 10.7 g/dL — ABNORMAL LOW (ref 12.0–16.0)
MCH: 23.1 pg — AB (ref 26.0–34.0)
MCHC: 31.5 g/dL — ABNORMAL LOW (ref 32.0–36.0)
MCV: 73.3 fL — AB (ref 80.0–100.0)
Platelets: 165 10*3/uL (ref 150–440)
RBC: 4.62 MIL/uL (ref 3.80–5.20)
RDW: 16.2 % — AB (ref 11.5–14.5)
WBC: 9.5 10*3/uL (ref 3.6–11.0)

## 2017-06-29 LAB — CHLAMYDIA/NGC RT PCR (ARMC ONLY)
Chlamydia Tr: NOT DETECTED
N gonorrhoeae: NOT DETECTED

## 2017-06-29 LAB — RAPID HIV SCREEN (HIV 1/2 AB+AG)
HIV 1/2 ANTIBODIES: NONREACTIVE
HIV-1 P24 ANTIGEN - HIV24: NONREACTIVE

## 2017-06-29 SURGERY — Surgical Case
Anesthesia: Epidural

## 2017-06-29 MED ORDER — MISOPROSTOL 25 MCG QUARTER TABLET
25.0000 ug | ORAL_TABLET | ORAL | Status: DC
  Administered 2017-06-29 (×4): 25 ug via ORAL
  Filled 2017-06-29 (×5): qty 1

## 2017-06-29 MED ORDER — OXYTOCIN 10 UNIT/ML IJ SOLN
INTRAMUSCULAR | Status: AC
  Filled 2017-06-29: qty 2

## 2017-06-29 MED ORDER — OXYTOCIN BOLUS FROM INFUSION: 500.0000 mL | Freq: Once | INTRAVENOUS | Status: DC

## 2017-06-29 MED ORDER — LACTATED RINGERS IV SOLN
500.0000 mL | INTRAVENOUS | Status: DC | PRN
  Administered 2017-06-30: 500 mL via INTRAVENOUS

## 2017-06-29 MED ORDER — AMMONIA AROMATIC IN INHA
RESPIRATORY_TRACT | Status: AC
  Filled 2017-06-29: qty 10

## 2017-06-29 MED ORDER — ONDANSETRON HCL 4 MG/2ML IJ SOLN
4.0000 mg | Freq: Four times a day (QID) | INTRAMUSCULAR | Status: DC | PRN
  Administered 2017-06-30: 4 mg via INTRAVENOUS
  Filled 2017-06-29: qty 2

## 2017-06-29 MED ORDER — TERBUTALINE SULFATE 1 MG/ML IJ SOLN: 0.2500 mg | Freq: Once | INTRAMUSCULAR | Status: DC | PRN

## 2017-06-29 MED ORDER — OXYTOCIN 40 UNITS IN LACTATED RINGERS INFUSION - SIMPLE MED: 1.0000 m[IU]/min | INTRAVENOUS | Status: DC

## 2017-06-29 MED ORDER — SODIUM CHLORIDE 0.9 % IJ SOLN
INTRAMUSCULAR | Status: AC
  Filled 2017-06-29: qty 50

## 2017-06-29 MED ORDER — MISOPROSTOL 200 MCG PO TABS
ORAL_TABLET | ORAL | Status: AC
  Administered 2017-06-29: 25 ug via ORAL
  Filled 2017-06-29: qty 4

## 2017-06-29 MED ORDER — OXYTOCIN 40 UNITS IN LACTATED RINGERS INFUSION - SIMPLE MED
INTRAVENOUS | Status: AC
  Administered 2017-06-29: 2 m[IU]/min via INTRAVENOUS
  Filled 2017-06-29: qty 1000

## 2017-06-29 MED ORDER — LACTATED RINGERS IV SOLN
INTRAVENOUS | Status: DC
  Administered 2017-06-29 – 2017-06-30 (×4): via INTRAVENOUS

## 2017-06-29 MED ORDER — LIDOCAINE HCL (PF) 1 % IJ SOLN
INTRAMUSCULAR | Status: AC
  Filled 2017-06-29: qty 30

## 2017-06-29 MED ORDER — OXYTOCIN 40 UNITS IN LACTATED RINGERS INFUSION - SIMPLE MED
2.5000 [IU]/h | INTRAVENOUS | Status: DC
  Filled 2017-06-29: qty 1000

## 2017-06-29 MED ORDER — OXYTOCIN 40 UNITS IN LACTATED RINGERS INFUSION - SIMPLE MED
1.0000 m[IU]/min | INTRAVENOUS | Status: DC
  Administered 2017-06-29: 2 m[IU]/min via INTRAVENOUS
  Administered 2017-07-01: 700 mL via INTRAVENOUS
  Filled 2017-06-29: qty 1000

## 2017-06-29 MED ORDER — ACETAMINOPHEN 325 MG PO TABS: 650.0000 mg | ORAL_TABLET | ORAL | Status: DC | PRN

## 2017-06-29 SURGICAL SUPPLY — 31 items
CANISTER SUCT 3000ML PPV (MISCELLANEOUS) ×3 IMPLANT
CATH KIT ON-Q SILVERSOAK 5IN (CATHETERS) ×6 IMPLANT
CLOSURE WOUND 1/2 X4 (GAUZE/BANDAGES/DRESSINGS) ×1
DERMABOND ADVANCED (GAUZE/BANDAGES/DRESSINGS) ×2
DERMABOND ADVANCED .7 DNX12 (GAUZE/BANDAGES/DRESSINGS) ×1 IMPLANT
DRSG OPSITE POSTOP 4X10 (GAUZE/BANDAGES/DRESSINGS) ×3 IMPLANT
DRSG TELFA 3X8 NADH (GAUZE/BANDAGES/DRESSINGS) ×3 IMPLANT
ELECT CAUTERY BLADE 6.4 (BLADE) ×3 IMPLANT
ELECT REM PT RETURN 9FT ADLT (ELECTROSURGICAL) ×3
ELECTRODE REM PT RTRN 9FT ADLT (ELECTROSURGICAL) ×1 IMPLANT
GAUZE SPONGE 4X4 12PLY STRL (GAUZE/BANDAGES/DRESSINGS) ×3 IMPLANT
GLOVE BIO SURGEON STRL SZ7 (GLOVE) ×9 IMPLANT
GLOVE INDICATOR 7.5 STRL GRN (GLOVE) ×9 IMPLANT
GOWN STRL REUS W/ TWL LRG LVL3 (GOWN DISPOSABLE) ×3 IMPLANT
GOWN STRL REUS W/TWL LRG LVL3 (GOWN DISPOSABLE) ×6
NS IRRIG 1000ML POUR BTL (IV SOLUTION) ×3 IMPLANT
PACK C SECTION AR (MISCELLANEOUS) ×3 IMPLANT
PAD OB MATERNITY 4.3X12.25 (PERSONAL CARE ITEMS) ×6 IMPLANT
PAD PREP 24X41 OB/GYN DISP (PERSONAL CARE ITEMS) ×3 IMPLANT
SPONGE LAP 18X18 5 PK (GAUZE/BANDAGES/DRESSINGS) ×6 IMPLANT
STRIP CLOSURE SKIN 1/2X4 (GAUZE/BANDAGES/DRESSINGS) ×2 IMPLANT
SUT CHROMIC GUT BROWN 0 54 (SUTURE) ×1 IMPLANT
SUT CHROMIC GUT BROWN 0 54IN (SUTURE) ×3
SUT MNCRL 4-0 (SUTURE) ×2
SUT MNCRL 4-0 27XMFL (SUTURE) ×1
SUT PDS AB 1 TP1 96 (SUTURE) ×3 IMPLANT
SUT PLAIN GUT 0 (SUTURE) IMPLANT
SUT VIC AB 0 CTX 36 (SUTURE) ×4
SUT VIC AB 0 CTX36XBRD ANBCTRL (SUTURE) ×2 IMPLANT
SUTURE MNCRL 4-0 27XMF (SUTURE) ×1 IMPLANT
SWABSTK COMLB BENZOIN TINCTURE (MISCELLANEOUS) ×3 IMPLANT

## 2017-06-29 NOTE — Progress Notes (Signed)
   Subjective:  Doing well no concerns.  Objective:   Vitals: Blood pressure 129/71, pulse 91, temperature 98.2 F (36.8 C), temperature source Oral, resp. rate 16, height 5' (1.524 m), weight 192 lb (87.1 kg), last menstrual period 09/03/2016. General: NAD Abdomen:gravid, non-tender Cervical Exam:  Dilation: Fingertip Effacement (%): 30 Cervical Position: Middle Station: -3 Exam by:: LSE  FHT: 130, moderate, +accels, no decels Toco:q6-267min  Results for orders placed or performed during the hospital encounter of 06/29/17 (from the past 24 hour(s))  CBC     Status: Abnormal   Collection Time: 06/29/17  6:10 AM  Result Value Ref Range   WBC 9.5 3.6 - 11.0 K/uL   RBC 4.62 3.80 - 5.20 MIL/uL   Hemoglobin 10.7 (L) 12.0 - 16.0 g/dL   HCT 16.133.9 (L) 09.635.0 - 04.547.0 %   MCV 73.3 (L) 80.0 - 100.0 fL   MCH 23.1 (L) 26.0 - 34.0 pg   MCHC 31.5 (L) 32.0 - 36.0 g/dL   RDW 40.916.2 (H) 81.111.5 - 91.414.5 %   Platelets 165 150 - 440 K/uL  Type and screen [facility]     Status: None   Collection Time: 06/29/17  6:10 AM  Result Value Ref Range   ABO/RH(D) A POS    Antibody Screen NEG    Sample Expiration 07/02/2017   Chlamydia/NGC rt PCR ([facility] only)     Status: None   Collection Time: 06/29/17  6:10 AM  Result Value Ref Range   Specimen source GC/Chlam URINE, RANDOM    Chlamydia Tr NOT DETECTED NOT DETECTED   N gonorrhoeae NOT DETECTED NOT DETECTED  Rapid HIV screen (HIV 1/2 Ab+Ag) ([facility] Only)     Status: None   Collection Time: 06/29/17  6:10 AM  Result Value Ref Range   HIV-1 P24 Antigen - HIV24 NON REACTIVE NON REACTIVE   HIV 1/2 Antibodies NON REACTIVE NON REACTIVE   Interpretation (HIV Ag Ab)      A non reactive test result means that HIV 1 or HIV 2 antibodies and HIV 1 p24 antigen were not detected in the specimen.    Assessment:   26 y.o. G1P0 4644w1d planned postdates IOL  Plan:   1) Labor - continue cytotec  2) Fetus - cat I tracing - 1-hr glucola 137 (early  106) with 3-hr normal 73/147/146/115

## 2017-06-29 NOTE — H&P (Signed)
Obstetrics Admission History & Physical   Postdates induction of labor  HPI:  26 y.o. G1P0 @ 5747w1d (06/21/2017, by Ultrasound). Admitted on 06/29/2017:   Patient Active Problem List   Diagnosis Date Noted  . Encounter for planned induction of labor 06/29/2017  . NST (non-stress test) reactive 06/28/2017     Presents for induction of labor. Felt some contractions last night but they have subsided. Denies vaginal bleeding and loss of fluid. Good fetal movement.  [facility] Pregnancy complicated by anemia and abnormal 1 hour GTT.  ROS: A review of systems was performed and negative, except as stated in the above HPI. PMHx:  Past Medical History:  Diagnosis Date  . Medical history non-contributory    PSHx: No past surgical history on file. Medications:  Medications Prior to Admission  Medication Sig Dispense Refill Last Dose  . aspirin EC 81 MG tablet Take 81 mg by mouth daily.   Not Taking at Unknown time  . ferrous sulfate 325 (65 FE) MG EC tablet Take 325 mg by mouth 3 (three) times daily with meals.   06/14/2017 at Unknown time  . Prenatal Vit-Fe Fumarate-FA (MULTIVITAMIN-PRENATAL) 27-0.8 MG TABS tablet Take 1 tablet by mouth daily at 12 noon.   06/14/2017 at Unknown time   Allergies: is allergic to coconut flavor. OBHx:  OB History  Gravida Para Term Preterm AB Living  1         0  SAB TAB Ectopic Multiple Live Births               # Outcome Date GA Lbr Len/2nd Weight Sex Delivery Anes PTL Lv  1 Current              GMW:NUUVOZDG/UYQIHKVQQVZDFHx:Negative/unremarkable except as detailed in HPI.Marland Kitchen.  No family history of birth defects. Soc Hx: Never smoker, Alcohol: none and Recreational drug use: none  Objective:  There were no vitals filed for this visit. Constitutional: Well nourished, well developed female in no acute distress.  HEENT: normal Skin: Warm and dry.  Cardiovascular: Regular rate and rhythm.   Extremity: trace to 1+ bilateral pedal edema Respiratory: Normal  respiratory effort Abdomen: mild Back: no CVAT Neuro: DTRs 2+, Cranial nerves grossly intact Psych: Alert and Oriented x3. No memory deficits. Normal mood and affect.  MS: normal gait, normal bilateral lower extremity ROM/strength/stability.  Pelvic exam: is not limited by body habitus External Genitalia, Bartholin's glands, Urethra, Skene's glands: within normal limits Vagina: within normal limits and with no blood in the vault Cervix: 0.5/30/-3 Uterus: No contractions observed Adnexa: not evaluated  EFM: FHR: 125 bpm, variability: moderate,  accelerations:  Absent,  decelerations:  Absent Toco: occasional contractions   Perinatal info:  Blood type: A positive Rubella - Immune Varicella - Immune TDaP Given during third trimester of this pregnancy RPR NR / HIV Neg/ HBsAg Neg   Assessment & Plan:   26 y.o. G1P0 @ 6347w1d, Admitted on 06/29/2017 for postdates induction of labor.     Admit for labor, Observe for cervical change, Fetal Wellbeing Reassuring, Epidural when ready, AROM when Appropriate and GBS status negative, treat as needed  Marcelyn BruinsJacelyn Chazz Philson, CNM [facility], [facility] 06/29/2017  5:49 AM

## 2017-06-30 ENCOUNTER — Encounter: Payer: Self-pay | Admitting: Anesthesiology

## 2017-06-30 ENCOUNTER — Inpatient Hospital Stay: Payer: Medicaid Other | Admitting: Anesthesiology

## 2017-06-30 LAB — RPR: RPR Ser Ql: NONREACTIVE

## 2017-06-30 MED ORDER — DIPHENHYDRAMINE HCL 50 MG/ML IJ SOLN: 12.5000 mg | INTRAMUSCULAR | Status: DC | PRN

## 2017-06-30 MED ORDER — BUTORPHANOL TARTRATE 1 MG/ML IJ SOLN
1.0000 mg | INTRAMUSCULAR | Status: DC | PRN
  Administered 2017-06-30 (×2): 1 mg via INTRAVENOUS
  Filled 2017-06-30 (×2): qty 1

## 2017-06-30 MED ORDER — DEXTROSE IN LACTATED RINGERS 5 % IV SOLN
INTRAVENOUS | Status: DC
  Administered 2017-06-30: 21:00:00 via INTRAVENOUS

## 2017-06-30 MED ORDER — PHENYLEPHRINE 40 MCG/ML (10ML) SYRINGE FOR IV PUSH (FOR BLOOD PRESSURE SUPPORT): 80.0000 ug | PREFILLED_SYRINGE | INTRAVENOUS | Status: DC | PRN

## 2017-06-30 MED ORDER — BUPIVACAINE HCL (PF) 0.25 % IJ SOLN
INTRAMUSCULAR | Status: DC | PRN
  Administered 2017-06-30: 8 mL via EPIDURAL

## 2017-06-30 MED ORDER — LACTATED RINGERS IV SOLN: 500.0000 mL | Freq: Once | INTRAVENOUS | Status: DC

## 2017-06-30 MED ORDER — FENTANYL 2.5 MCG/ML W/ROPIVACAINE 0.15% IN NS 100 ML EPIDURAL (ARMC)
EPIDURAL | Status: AC
  Filled 2017-06-30: qty 100

## 2017-06-30 MED ORDER — FENTANYL 2.5 MCG/ML W/ROPIVACAINE 0.15% IN NS 100 ML EPIDURAL (ARMC): 12.0000 mL/h | EPIDURAL | Status: DC

## 2017-06-30 MED ORDER — LIDOCAINE-EPINEPHRINE (PF) 1.5 %-1:200000 IJ SOLN
INTRAMUSCULAR | Status: DC | PRN
  Administered 2017-06-30: 3 mL via PERINEURAL

## 2017-06-30 MED ORDER — LIDOCAINE HCL (PF) 1 % IJ SOLN
INTRAMUSCULAR | Status: DC | PRN
  Administered 2017-06-30: 3 mL

## 2017-06-30 MED ORDER — EPHEDRINE 5 MG/ML INJ: 10.0000 mg | INTRAVENOUS | Status: DC | PRN

## 2017-06-30 NOTE — Anesthesia Procedure Notes (Signed)
Epidural Patient location during procedure: OB Start time: 06/30/2017 5:20 PM End time: 06/30/2017 5:28 PM  Staffing Anesthesiologist: Yves Dillarroll, Ivalee Strauser, MD Performed: anesthesiologist   Preanesthetic Checklist Completed: patient identified, site marked, surgical consent, pre-op evaluation, timeout performed, IV checked, risks and benefits discussed and monitors and equipment checked  Epidural Patient position: sitting Prep: Betadine Patient monitoring: heart rate, continuous pulse ox and blood pressure Approach: midline Location: L3-L4 Injection technique: LOR air  Needle:  Needle type: Tuohy  Needle gauge: 17 G Needle length: 9 cm and 9 Catheter type: closed end flexible Catheter size: 19 Gauge Test dose: negative and 1.5% lidocaine with Epi 1:200 K  Assessment Sensory level: T8 Events: blood not aspirated, injection not painful, no injection resistance, negative IV test and no paresthesia  Additional Notes Time out called.  Patient placed in sitting position.  Back prepped and draped in sterile fashion.  A skin wheal was made in the L3-L4 interspace with 1% Lidocaine plain.  A 17G Tuohy needle was advanced to the epidural space by a loss of resistance technique.  The epidural was advanced easily 3 cm with a negative TD.  The epidural catheter was affixed to the back in sterile fashion.  No blood or paresthesias or fluid.  The patient tolerated the procedure well.Reason for block:procedure for pain

## 2017-06-30 NOTE — Progress Notes (Signed)
L&D Progress Note  Admission for IOL for postdates Has received 4 rounds of Cytotec and is currently on 10 miu/min Pitocin  S: Contractions getting stronger.   O: BP 124/76 (BP Location: Right Arm)   Pulse 91   Temp 98.3 F (36.8 C) (Oral)   Resp 17   Ht 5' (1.524 m)   Wt 192 lb (87.1 kg)   LMP 09/03/2016 (Exact Date)   BMI 37.50 kg/m    FHR 135 with accelerations to 150, moderate variability Contractions: q2-3 min apart Cervix: 1+/70%/ -2/anterior  A: SLow progress  P: 30 cc foley bulb inserted in cervix Pitocin decreased to 5 miu/min Can have regular breakfast, then clear liquids Stadol or N2O2 for pain if desires.  Farrel Connersolleen Aquila Delaughter, CNM

## 2017-06-30 NOTE — Progress Notes (Signed)
L&D Progress Note  S: Feeling pelvic pressure with contractions. Feels better if she sits up with contractions. Desires pain medication  O: BP 141/80 General: appears fairly comfortable. Sitting up with contractions  FHR: 130 with accelerations to 150s, moderate variability IUPC: usually 3-4 contractions in 10 minutes. Pretty consistently 200 mvus+ for the last 1.5 hours. Pitocin at 11 miu/min  Cervix: 4/80%/-1  A: Starting to get adequate contractions FWB: Cat 1 tracing  P: Stadol for pain, epidural when desires Continue to titrate Pitocin to get 200-250 mvus  Farrel Connersolleen Pa Tennant, CNM

## 2017-06-30 NOTE — Progress Notes (Signed)
L&D progress Note  S: Foley bulb fell out at 1215. Contractions feeling a little stronger, not ready for epidural yet.  O: 116/75  General: appears fairly comfortable. FHR: 125 with accelerations to 150s, moderate variability Toco: difficulty picking up with external toco. Pitocin at 6 miu/min  Cervix: 3.5/70%/-2/ mid  A: some progress with foley bulb Cat 1 tracing  P: AROM for moderate amt clear fluid. IUPC inserted to better titrate Pitocin-contractions q3 min apart and mvus 110 Titrate Pitocin to  200-250 mvus.  Rebecca Walton, CNM

## 2017-06-30 NOTE — Progress Notes (Signed)
   Subjective:  Comfortable, non-painful contractions  Objective:   Vitals: Blood pressure 133/79, pulse 91, temperature 98.3 F (36.8 C), temperature source Oral, resp. rate 16, height 5' (1.524 m), weight 192 lb (87.1 kg), last menstrual period 09/03/2016. General:  Abdomen: Cervical Exam:  Dilation: 1 Effacement (%): 30 Cervical Position: Anterior Station: -3 Presentation: Vertex Exam by:: MBS  FHT: 130, moderate, +accels, no decels Toco: q2-623min  Results for orders placed or performed during the hospital encounter of 06/29/17 (from the past 24 hour(s))  CBC     Status: Abnormal   Collection Time: 06/29/17  6:10 AM  Result Value Ref Range   WBC 9.5 3.6 - 11.0 K/uL   RBC 4.62 3.80 - 5.20 MIL/uL   Hemoglobin 10.7 (L) 12.0 - 16.0 g/dL   HCT 62.933.9 (L) 52.835.0 - 41.347.0 %   MCV 73.3 (L) 80.0 - 100.0 fL   MCH 23.1 (L) 26.0 - 34.0 pg   MCHC 31.5 (L) 32.0 - 36.0 g/dL   RDW 24.416.2 (H) 01.011.5 - 27.214.5 %   Platelets 165 150 - 440 K/uL  Type and screen [facility]     Status: None   Collection Time: 06/29/17  6:10 AM  Result Value Ref Range   ABO/RH(D) A POS    Antibody Screen NEG    Sample Expiration 07/02/2017   Chlamydia/NGC rt PCR ([facility] only)     Status: None   Collection Time: 06/29/17  6:10 AM  Result Value Ref Range   Specimen source GC/Chlam URINE, RANDOM    Chlamydia Tr NOT DETECTED NOT DETECTED   N gonorrhoeae NOT DETECTED NOT DETECTED  Rapid HIV screen (HIV 1/2 Ab+Ag) ([facility] Only)     Status: None   Collection Time: 06/29/17  6:10 AM  Result Value Ref Range   HIV-1 P24 Antigen - HIV24 NON REACTIVE NON REACTIVE   HIV 1/2 Antibodies NON REACTIVE NON REACTIVE   Interpretation (HIV Ag Ab)      A non reactive test result means that HIV 1 or HIV 2 antibodies and HIV 1 p24 antigen were not detected in the specimen.    Assessment:   26 y.o. G1P0 3461w2d IOL postdates  Plan:   1) Labor - switch to pitocin  2) Fetus - cat I

## 2017-06-30 NOTE — Anesthesia Preprocedure Evaluation (Addendum)
Anesthesia Evaluation  Patient identified by MRN, date of birth, ID band Patient awake    Reviewed: Allergy & Precautions, NPO status , Patient's Chart, lab work & pertinent test results  Airway Mallampati: II  TM Distance: >3 FB     Dental  (+) Teeth Intact   Pulmonary neg pulmonary ROS,    Pulmonary exam normal        Cardiovascular negative cardio ROS Normal cardiovascular exam     Neuro/Psych negative neurological ROS  negative psych ROS   GI/Hepatic negative GI ROS, Neg liver ROS,   Endo/Other  negative endocrine ROS  Renal/GU negative Renal ROS  negative genitourinary   Musculoskeletal negative musculoskeletal ROS (+)   Abdominal Normal abdominal exam  (+)   Peds negative pediatric ROS (+)  Hematology negative hematology ROS (+)   Anesthesia Other Findings   Reproductive/Obstetrics (+) Pregnancy                             Anesthesia Physical Anesthesia Plan  ASA: II  Anesthesia Plan: Epidural   Post-op Pain Management:    Induction:   PONV Risk Score and Plan:   Airway Management Planned: Nasal Cannula  Additional Equipment:   Intra-op Plan:   Post-operative Plan:   Informed Consent: I have reviewed the patients History and Physical, chart, labs and discussed the procedure including the risks, benefits and alternatives for the proposed anesthesia with the patient or authorized representative who has indicated his/her understanding and acceptance.   Dental advisory given  Plan Discussed with: CRNA and Surgeon  Anesthesia Plan Comments: (0130:  Failure to progress, so patient was prepped for C-S and the epidural will be dosed for the procedure.)       Anesthesia Quick Evaluation

## 2017-07-01 DIAGNOSIS — O480 Post-term pregnancy: Secondary | ICD-10-CM

## 2017-07-01 DIAGNOSIS — Z3A41 41 weeks gestation of pregnancy: Secondary | ICD-10-CM

## 2017-07-01 DIAGNOSIS — O411230 Chorioamnionitis, third trimester, not applicable or unspecified: Secondary | ICD-10-CM

## 2017-07-01 LAB — CBC
HCT: 24.5 % — ABNORMAL LOW (ref 35.0–47.0)
HEMOGLOBIN: 7.7 g/dL — AB (ref 12.0–16.0)
MCH: 22.9 pg — ABNORMAL LOW (ref 26.0–34.0)
MCHC: 31.3 g/dL — ABNORMAL LOW (ref 32.0–36.0)
MCV: 73.1 fL — AB (ref 80.0–100.0)
PLATELETS: 145 10*3/uL — AB (ref 150–440)
RBC: 3.36 MIL/uL — AB (ref 3.80–5.20)
RDW: 16.4 % — ABNORMAL HIGH (ref 11.5–14.5)
WBC: 12.7 10*3/uL — AB (ref 3.6–11.0)

## 2017-07-01 MED ORDER — BUPIVACAINE HCL (PF) 0.5 % IJ SOLN
INTRAMUSCULAR | Status: DC | PRN
  Administered 2017-07-01 (×2): 2 mL
  Administered 2017-07-01: 4 mL
  Administered 2017-07-01: 2 mL

## 2017-07-01 MED ORDER — OXYCODONE-ACETAMINOPHEN 5-325 MG PO TABS: 1.0000 | ORAL_TABLET | ORAL | Status: DC | PRN

## 2017-07-01 MED ORDER — BUPIVACAINE HCL (PF) 0.5 % IJ SOLN
INTRAMUSCULAR | Status: AC
  Filled 2017-07-01: qty 30

## 2017-07-01 MED ORDER — KETAMINE HCL 50 MG/ML IJ SOLN
INTRAMUSCULAR | Status: AC
  Filled 2017-07-01: qty 10

## 2017-07-01 MED ORDER — KETOROLAC TROMETHAMINE 30 MG/ML IJ SOLN
INTRAMUSCULAR | Status: AC
  Filled 2017-07-01: qty 1

## 2017-07-01 MED ORDER — LACTATED RINGERS IV SOLN
INTRAVENOUS | Status: DC | PRN
  Administered 2017-07-01: 02:00:00 via INTRAVENOUS

## 2017-07-01 MED ORDER — WITCH HAZEL-GLYCERIN EX PADS: 1.0000 "application " | MEDICATED_PAD | CUTANEOUS | Status: DC | PRN

## 2017-07-01 MED ORDER — ONDANSETRON HCL 4 MG/2ML IJ SOLN
INTRAMUSCULAR | Status: AC
  Filled 2017-07-01: qty 2

## 2017-07-01 MED ORDER — OXYCODONE-ACETAMINOPHEN 5-325 MG PO TABS
1.0000 | ORAL_TABLET | ORAL | Status: DC | PRN
  Administered 2017-07-01 – 2017-07-02 (×3): 1 via ORAL
  Filled 2017-07-01 (×3): qty 1

## 2017-07-01 MED ORDER — KETAMINE HCL 50 MG/ML IJ SOLN
INTRAMUSCULAR | Status: DC | PRN
  Administered 2017-07-01: 25 mg via INTRAMUSCULAR

## 2017-07-01 MED ORDER — FERROUS SULFATE 325 (65 FE) MG PO TABS
325.0000 mg | ORAL_TABLET | Freq: Two times a day (BID) | ORAL | Status: DC
  Administered 2017-07-01 – 2017-07-03 (×5): 325 mg via ORAL
  Filled 2017-07-01 (×5): qty 1

## 2017-07-01 MED ORDER — MORPHINE SULFATE (PF) 0.5 MG/ML IJ SOLN
INTRAMUSCULAR | Status: AC
  Filled 2017-07-01: qty 10

## 2017-07-01 MED ORDER — METOPROLOL TARTRATE 5 MG/5ML IV SOLN
INTRAVENOUS | Status: DC | PRN
Start: 2017-07-01 — End: 2017-07-01
  Administered 2017-07-01: 3 mg via INTRAVENOUS

## 2017-07-01 MED ORDER — LIDOCAINE 2% (20 MG/ML) 5 ML SYRINGE
INTRAMUSCULAR | Status: DC | PRN
  Administered 2017-07-01: 260 mg via INTRAVENOUS
  Administered 2017-07-01: 60 mg via INTRAVENOUS

## 2017-07-01 MED ORDER — DIPHENHYDRAMINE HCL 25 MG PO CAPS: 25.0000 mg | ORAL_CAPSULE | Freq: Four times a day (QID) | ORAL | Status: DC | PRN

## 2017-07-01 MED ORDER — ONDANSETRON HCL 4 MG/2ML IJ SOLN
INTRAMUSCULAR | Status: DC | PRN
  Administered 2017-07-01: 4 mg via INTRAVENOUS

## 2017-07-01 MED ORDER — MENTHOL 3 MG MT LOZG
1.0000 | LOZENGE | OROMUCOSAL | Status: DC | PRN
  Filled 2017-07-01: qty 9

## 2017-07-01 MED ORDER — ACETAMINOPHEN 650 MG RE SUPP
650.0000 mg | RECTAL | Status: DC | PRN
  Administered 2017-07-01: 650 mg via RECTAL
  Filled 2017-07-01: qty 1

## 2017-07-01 MED ORDER — BUPIVACAINE 0.25 % ON-Q PUMP DUAL CATH 400 ML
400.0000 mL | INJECTION | Status: DC
  Filled 2017-07-01: qty 400

## 2017-07-01 MED ORDER — BUPIVACAINE HCL 0.5 % IJ SOLN
INTRAMUSCULAR | Status: DC | PRN
  Administered 2017-07-01: 10 mL

## 2017-07-01 MED ORDER — DEXTROSE 5 % IV SOLN
500.0000 mg | Freq: Once | INTRAVENOUS | Status: AC
  Administered 2017-07-01: 500 mg via INTRAVENOUS
  Filled 2017-07-01: qty 500

## 2017-07-01 MED ORDER — LACTATED RINGERS IV SOLN
INTRAVENOUS | Status: DC
  Administered 2017-07-01 – 2017-07-02 (×3): via INTRAVENOUS

## 2017-07-01 MED ORDER — FENTANYL CITRATE (PF) 100 MCG/2ML IJ SOLN
INTRAMUSCULAR | Status: AC
  Filled 2017-07-01: qty 2

## 2017-07-01 MED ORDER — BUPIVACAINE HCL (PF) 0.5 % IJ SOLN
INTRAMUSCULAR | Status: AC
  Filled 2017-07-01: qty 10

## 2017-07-01 MED ORDER — SOD CITRATE-CITRIC ACID 500-334 MG/5ML PO SOLN
ORAL | Status: AC
  Filled 2017-07-01: qty 15

## 2017-07-01 MED ORDER — COCONUT OIL OIL: 1.0000 "application " | TOPICAL_OIL | Status: DC | PRN

## 2017-07-01 MED ORDER — FENTANYL CITRATE (PF) 100 MCG/2ML IJ SOLN
INTRAMUSCULAR | Status: DC | PRN
  Administered 2017-07-01: 100 ug via INTRAVENOUS

## 2017-07-01 MED ORDER — OXYCODONE-ACETAMINOPHEN 5-325 MG PO TABS: 2.0000 | ORAL_TABLET | ORAL | Status: DC | PRN

## 2017-07-01 MED ORDER — PRENATAL MULTIVITAMIN CH
1.0000 | ORAL_TABLET | Freq: Every day | ORAL | Status: DC
  Administered 2017-07-01 – 2017-07-03 (×3): 1 via ORAL
  Filled 2017-07-01 (×3): qty 1

## 2017-07-01 MED ORDER — SENNOSIDES-DOCUSATE SODIUM 8.6-50 MG PO TABS
2.0000 | ORAL_TABLET | ORAL | Status: DC
  Administered 2017-07-02 – 2017-07-03 (×2): 2 via ORAL
  Filled 2017-07-01 (×2): qty 2

## 2017-07-01 MED ORDER — MORPHINE SULFATE (PF) 2 MG/ML IV SOLN
1.0000 mg | INTRAVENOUS | Status: AC | PRN
  Administered 2017-07-02: 1 mg via INTRAVENOUS
  Filled 2017-07-01: qty 1

## 2017-07-01 MED ORDER — ONDANSETRON HCL 4 MG/2ML IJ SOLN
4.0000 mg | Freq: Four times a day (QID) | INTRAMUSCULAR | Status: DC | PRN
  Administered 2017-07-01 – 2017-07-02 (×2): 4 mg via INTRAVENOUS
  Filled 2017-07-01 (×2): qty 2

## 2017-07-01 MED ORDER — SIMETHICONE 80 MG PO CHEW
80.0000 mg | CHEWABLE_TABLET | Freq: Three times a day (TID) | ORAL | Status: DC
  Administered 2017-07-01 – 2017-07-03 (×7): 80 mg via ORAL
  Filled 2017-07-01 (×7): qty 1

## 2017-07-01 MED ORDER — ONDANSETRON HCL 4 MG/2ML IJ SOLN: 4.0000 mg | Freq: Once | INTRAMUSCULAR | Status: DC | PRN

## 2017-07-01 MED ORDER — CEFAZOLIN SODIUM-DEXTROSE 2-4 GM/100ML-% IV SOLN
2.0000 g | INTRAVENOUS | Status: AC
  Administered 2017-07-01: 2 g via INTRAVENOUS
  Filled 2017-07-01: qty 100

## 2017-07-01 MED ORDER — PROPOFOL 10 MG/ML IV BOLUS
INTRAVENOUS | Status: AC
  Filled 2017-07-01: qty 20

## 2017-07-01 MED ORDER — OXYTOCIN 40 UNITS IN LACTATED RINGERS INFUSION - SIMPLE MED
2.5000 [IU]/h | INTRAVENOUS | Status: AC
  Filled 2017-07-01: qty 1000

## 2017-07-01 MED ORDER — OXYTOCIN 40 UNITS IN LACTATED RINGERS INFUSION - SIMPLE MED
INTRAVENOUS | Status: AC
  Filled 2017-07-01: qty 1000

## 2017-07-01 MED ORDER — IBUPROFEN 600 MG PO TABS: 600.0000 mg | ORAL_TABLET | Freq: Four times a day (QID) | ORAL | Status: DC

## 2017-07-01 MED ORDER — DIBUCAINE 1 % RE OINT: 1.0000 "application " | TOPICAL_OINTMENT | RECTAL | Status: DC | PRN

## 2017-07-01 MED ORDER — IBUPROFEN 600 MG PO TABS
600.0000 mg | ORAL_TABLET | Freq: Four times a day (QID) | ORAL | Status: DC | PRN
  Administered 2017-07-01 – 2017-07-02 (×3): 600 mg via ORAL
  Filled 2017-07-01 (×3): qty 1

## 2017-07-01 MED ORDER — MORPHINE SULFATE (PF) 0.5 MG/ML IJ SOLN
INTRAMUSCULAR | Status: DC | PRN
  Administered 2017-07-01: 3 mg via EPIDURAL

## 2017-07-01 MED ORDER — BUPIVACAINE HCL (PF) 0.5 % IJ SOLN
10.0000 mL | Freq: Once | INTRAMUSCULAR | Status: DC
Start: 2017-07-01 — End: 2017-07-01

## 2017-07-01 MED ORDER — KETOROLAC TROMETHAMINE 30 MG/ML IJ SOLN
INTRAMUSCULAR | Status: DC | PRN
  Administered 2017-07-01: 30 mg via INTRAVENOUS

## 2017-07-01 NOTE — Progress Notes (Addendum)
Rebecca Walton CNM notified that pt. O2 Sat. While asleep is 88-89% and that after Pt. Encouraged to cough O2 Sat. Increased to 94% but decreased to 889% when back to sleep. Rebecca Walton CNM stated that Dr. Bonney AidStaebler wanted to cont. To observe pt. At this time and not further intervention was being ordered. I requested Rebecca Walton CNM come and assess pt. Because of HX cough X1 week with; fever at this time and sl. lethargic affect.

## 2017-07-01 NOTE — Progress Notes (Addendum)
Appx. 2135 Pt. Assisted up to BR and voided 300cc of yellow urine. Ambulated with steady gait, stand by assist;however, Pt. C/o feeling cold and has the chills. After voiding, Pt. Assisted back to bed. And warm blankets provided and Pt. States she is feeling, "Better, but still cold." Rechecked VS at appx. 2220 and temp. Is 99.3 ax. HR is 114. Pt. Had large emesis of undigested food.  Tillman SersJ. Schmid CNM notified of all the above and Pt. Cough that pt. States she has had X1 week. Tillman SersJ. Schmid CNM stated she is calling Dr. Bonney AidStaebler to ask about work-up. I requested PR Tylenol and IV Zofran as order in the mean time. Will cont. To follow closely. Pt. Instructed in IS and was able to raise disc to 1700.

## 2017-07-01 NOTE — Anesthesia Procedure Notes (Signed)
Date/Time: 07/01/2017 1:47 AM Performed by: Stormy Fabianurtis, Clancy Leiner, CRNA Pre-anesthesia Checklist: Patient identified, Emergency Drugs available, Suction available and Patient being monitored Patient Re-evaluated:Patient Re-evaluated prior to induction Oxygen Delivery Method: Nasal cannula Induction Type: IV induction Dental Injury: Teeth and Oropharynx as per pre-operative assessment  Comments: Nasal cannula with etCO2 monitoring

## 2017-07-01 NOTE — Progress Notes (Signed)
Subjective:  No acute distress, called because of 02 sat in the 80's while asleep rose appropriately once patient awakened.  Does report cough over the past week.  No incisional complains.  Pain well controlled on po analgesics.  She did have one episode of emesis this evening which was preceded by percocet ingestion. Urine has not been particularly concentrated per nursing staff   Objective:  Blood pressure 135/77, pulse (!) 110, temperature 99.4 F (37.4 C), temperature source Oral, resp. rate 20, height 5' (1.524 m), weight 192 lb (87.1 kg), last menstrual period 09/03/2016, SpO2 94 %.  Vital signs in last 24 hours: Temp:  [98.2 F (36.8 C)-101.1 F (38.4 C)] 99.4 F (37.4 C) (12/15 2332) Pulse Rate:  [90-120] 110 (12/15 2332) Resp:  [13-23] 20 (12/15 2332) BP: (117-140)/(63-82) 135/77 (12/15 2332) SpO2:  [93 %-99 %] 94 % (12/15 2332)    Intake/Output from previous day: 12/14 0701 - 12/15 0700 In: 100 [I.V.:100] Out: 2650 [Urine:1650; Blood:1000]  General: NAD Pulmonary: no increased work of breathing, CTAB  Extremities: no edema, no erythema, no tenderness  Results for orders placed or performed during the hospital encounter of 06/29/17 (from the past 72 hour(s))  CBC     Status: Abnormal   Collection Time: 06/29/17  6:10 AM  Result Value Ref Range   WBC 9.5 3.6 - 11.0 K/uL   RBC 4.62 3.80 - 5.20 MIL/uL   Hemoglobin 10.7 (L) 12.0 - 16.0 g/dL   HCT 16.133.9 (L) 09.635.0 - 04.547.0 %   MCV 73.3 (L) 80.0 - 100.0 fL   MCH 23.1 (L) 26.0 - 34.0 pg   MCHC 31.5 (L) 32.0 - 36.0 g/dL   RDW 40.916.2 (H) 81.111.5 - 91.414.5 %   Platelets 165 150 - 440 K/uL  Type and screen [facility]     Status: None   Collection Time: 06/29/17  6:10 AM  Result Value Ref Range   ABO/RH(D) A POS    Antibody Screen NEG    Sample Expiration 07/02/2017   RPR     Status: None   Collection Time: 06/29/17  6:10 AM  Result Value Ref Range   RPR Ser Ql Non Reactive Non Reactive    Comment:  (NOTE) Performed At: [facility] [address] [place], [place] [id number] Jolene SchimkeNagendra Sanjai MD YQ:[id number]:[phone]   Chlamydia/NGC rt PCR ([facility] only)     Status: None   Collection Time: 06/29/17  6:10 AM  Result Value Ref Range   Specimen source GC/Chlam URINE, RANDOM    Chlamydia Tr NOT DETECTED NOT DETECTED   N gonorrhoeae NOT DETECTED NOT DETECTED    Comment: (NOTE) 100  This methodology has not been evaluated in pregnant women or in 200  patients with a history of hysterectomy. 300 400  This methodology will not be performed on patients less than 714  years of age.   Rapid HIV screen (HIV 1/2 Ab+Ag) ([facility] Only)     Status: None   Collection Time: 06/29/17  6:10 AM  Result Value Ref Range   HIV-1 P24 Antigen - HIV24 NON REACTIVE NON REACTIVE   HIV 1/2 Antibodies NON REACTIVE NON REACTIVE   Interpretation (HIV Ag Ab)      A non reactive test result means that HIV 1 or HIV 2 antibodies and HIV 1 p24 antigen were not detected in the specimen.  CBC     Status: Abnormal   Collection Time: 07/01/17  1:46 PM  Result Value Ref Range  WBC 12.7 (H) 3.6 - 11.0 K/uL   RBC 3.36 (L) 3.80 - 5.20 MIL/uL   Hemoglobin 7.7 (L) 12.0 - 16.0 g/dL   HCT 16.124.5 (L) 09.635.0 - 04.547.0 %   MCV 73.1 (L) 80.0 - 100.0 fL   MCH 22.9 (L) 26.0 - 34.0 pg   MCHC 31.3 (L) 32.0 - 36.0 g/dL   RDW 40.916.4 (H) 81.111.5 - 91.414.5 %   Platelets 145 (L) 150 - 440 K/uL     Assessment:   26 y.o. G1P0 postoperativeday # 0 1LTCS   Plan:  1) Acute blood loss anemia - hemodynamically stable and asymptomatic - po ferrous sulfate - recheck CBC in AM, did have 1L EBL with delivery, 3 point drop in Hgb, with mild tachycardia  2) Low grade temps - did have one 101.66F right around delivery, afebrile to 99.8F postpartum - if temp above 100.8F or desaturation would pan culture and obtain CXR - if another desaturation episode would obtain PE CT given she did have a prolonged induction

## 2017-07-01 NOTE — Discharge Summary (Signed)
OB Discharge Summary     Patient Name: Rebecca QuintLisa M Walton DOB: 1991-07-16 MRN: [id number]  Date of admission: 06/29/2017 Delivering MD: Thomasene MohairStephen Jackson, MD  Date of Delivery: 07/01/2017  Date of discharge: 07/03/2017 Admitting diagnosis: induction for postdates Intrauterine pregnancy: 3964w3d     Secondary diagnosis: none     Discharge diagnosis: Term Pregnancy Delivered; Failure to progress in first stage, postpartum anemia                                                                                           Post partum procedures:blood transfusion x 1 unit  Augmentation: AROM, Pitocin, Cytotec and Foley Balloon  Complications: Failure to progress Maternal fever just before Cesarean section performed Postpartum anemia from acute blood loss requiring blood transfusion  Hospital course:  Induction of Labor With Cesarean Section  26 y.o. yo G1P0 at 2364w3d was admitted to the hospital 06/29/2017 for induction of labor. Patient had a labor course significant for induction with misoprostol, foley balloon and pitocin. She notably had a fever just prior to cesarean section.  For her cesarean antibiotics she was given the usual Ancef and Azithromycin.   The patient went for cesarean section 07/01/2017 at 0220 due to Arrest of Dilation, and delivered a Viable infant, Membrane Rupture Time/Date: )12:52 PM ,06/30/2017   Details of operation can be found in separate operative Note.  Patient had a postpartum course complicated by anemia with hemoglobin of 6.9gm/dl. She received one unit of blood which brought her hemoglobin up to 8.2gm/dl. She denies any lightheadedness when OOB and bleeding has been appropriate. She also had soome nausea and loose stools postpartum which were attributed to taking percocet on an empty stomach and taking iron (has caused loose stools previously), although a viral syndrome could not be ruled out. She is ambulating, tolerating a regular diet, passing flatus, and urinating  well.  Patient is discharged home in stable condition on 07/03/2017                                   Physical exam  Vitals:   07/02/17 2000 07/02/17 2357 07/03/17 0347 07/03/17 0729  BP: 124/72 (!) 125/52 120/65 124/67  Pulse: 99 93 (!) 101 100  Resp: 20 18 20 20   Temp: 98.5 F (36.9 C) 98.7 F (37.1 C) 99 F (37.2 C) 98.4 F (36.9 C)  TempSrc: Oral Oral Oral Oral  SpO2: 100% 98% 100% 98%  Weight:      Height:       General: alert, cooperative and no distress  Heart: tachycardia with reg rhythm and no murmurs appreciated Lungs: CTAB, no difficulty breathing Lochia: appropriate Uterine Fundus: firm/ U-1/ ML/ NT Incision: Dressing is clean, dry, and intact, ON Q intact DVT Evaluation: No evidence of DVT seen on physical exam.  Labs: Lab Results  Component Value Date   WBC 13.2 (H) 07/02/2017   HGB 8.2 (L) 07/02/2017   HCT 25.3 (L) 07/02/2017   MCV 74.6 (L) 07/02/2017   PLT 158 07/02/2017    Discharge instruction: per After Visit Summary.  Medications:  Allergies as of 07/03/2017      Reactions   Coconut Flavor       Medication List    STOP taking these medications   aspirin EC 81 MG tablet     TAKE these medications   acetaminophen 325 MG tablet Commonly known as:  TYLENOL Take 2 tablets (650 mg total) by mouth every 4 (four) hours as needed for mild pain.   ferrous sulfate 325 (65 FE) MG EC tablet Take 1 tablet (325 mg total) by mouth daily with breakfast. What changed:  when to take this   ibuprofen 600 MG tablet Commonly known as:  ADVIL,MOTRIN Take 1 tablet (600 mg total) by mouth every 6 (six) hours as needed for headache, mild pain or cramping.   levonorgestrel-ethinyl estradiol 0.1-20 MG-MCG tablet Commonly known as:  AVIANE,ALESSE,LESSINA Take 1 tablet by mouth daily. Start taking on:  07/30/2017   multivitamin-prenatal 27-0.8 MG Tabs tablet Take 1 tablet by mouth daily at 12 noon.   oxyCODONE 5 MG immediate release tablet Commonly known  as:  ROXICODONE Take one tablet every 4-6 hours prn moderate to severe pain       Diet: routine diet  Activity: Advance as tolerated. Pelvic rest for 6 weeks.   Outpatient follow up: Follow-up Information    Conard NovakJackson, Stephen D, MD. Schedule an appointment as soon as possible for a visit in 1 week(s).   Specialty:  Obstetrics and Gynecology Why:  post-op incision check Contact information: [address] [place] [address] [phone]             Postpartum contraception: Progesterone only pills Rhogam Given postpartum: no Rubella vaccine given postpartum: no Varicella vaccine given postpartum: no TDaP given antepartum or postpartum: AP, 03/24/2017 Influenza Vaccine: 05/19/17  Newborn Data: Live born female / Danne Harborubrey Birth Weight: 7 lb 13.9 oz (3570 g) APGAR: 9, 9  Newborn Delivery   Birth date/time:  07/01/2017 02:20:00 Delivery type:  C-Section, Low Transverse C-section categorization:  Primary     Baby Feeding: Bottle (switched from breast feeding)  Disposition:home with mother  SIGNED: Farrel Connersolleen Yamina Lenis, CNM

## 2017-07-01 NOTE — Progress Notes (Signed)
Patient has been titrated up to a pitocin of 22, she has made no significant change in nearly 12 hours. She has a fever of 101F and her fetus is beginning to show signs of tachycardia. Will proceed with urgent c-section delivery given arrest of dilation despite prolonged attempt. She also has developed chorioamnionitis.    Thomasene MohairStephen Roey Coopman, MD 07/01/2017 1:06 AM

## 2017-07-01 NOTE — Transfer of Care (Signed)
Immediate Anesthesia Transfer of Care Note  Patient: Rebecca Walton  Procedure(s) Performed: Procedure(s): CESAREAN SECTION (N/A)  Patient Location: PACU  Anesthesia Type:Spinal  Level of Consciousness: awake, alert  and oriented  Airway & Oxygen Therapy: Patient Spontanous Breathing and Patient connected to face mask oxygen  Post-op Assessment: Report given to RN and Post -op Vital signs reviewed and stable  Post vital signs: Reviewed and stable  Last Vitals:  Vitals:   07/01/17 0140 07/01/17 0330  BP:  140/82  Pulse:    Resp:  18  Temp:  36.8 C  SpO2: 99% 98%    Complications: No apparent anesthesia complications

## 2017-07-01 NOTE — Anesthesia Post-op Follow-up Note (Signed)
Anesthesia QCDR form completed.        

## 2017-07-01 NOTE — Progress Notes (Signed)
POD# 0 pLTCS for failure to progress Subjective:  Resting in bed, appears fatigued. Tolerating a regular diet. Not yet passing flatus. Foley in place and draining, urine output adequate. Plans to ambulate this evening.   Objective:  Blood pressure 131/71, pulse (!) 120, temperature 99.6 F (37.6 C), temperature source Oral, resp. rate 18, height 5' (1.524 m), weight 192 lb (87.1 kg), last menstrual period 09/03/2016, SpO2 97 %.  General: NAD Pulmonary: no increased work of breathing Abdomen: non-distended, non-tender, fundus firm at level of umbilicus, lochia rubra small Incision: Dressing C/D/I, On Q pump in place Extremities: no edema, no erythema, no tenderness  Results for orders placed or performed during the hospital encounter of 06/29/17 (from the past 72 hour(s))  CBC     Status: Abnormal   Collection Time: 06/29/17  6:10 AM  Result Value Ref Range   WBC 9.5 3.6 - 11.0 K/uL   RBC 4.62 3.80 - 5.20 MIL/uL   Hemoglobin 10.7 (L) 12.0 - 16.0 g/dL   HCT 16.133.9 (L) 09.635.0 - 04.547.0 %   MCV 73.3 (L) 80.0 - 100.0 fL   MCH 23.1 (L) 26.0 - 34.0 pg   MCHC 31.5 (L) 32.0 - 36.0 g/dL   RDW 40.916.2 (H) 81.111.5 - 91.414.5 %   Platelets 165 150 - 440 K/uL  Type and screen [facility]     Status: None   Collection Time: 06/29/17  6:10 AM  Result Value Ref Range   ABO/RH(D) A POS    Antibody Screen NEG    Sample Expiration 07/02/2017   RPR     Status: None   Collection Time: 06/29/17  6:10 AM  Result Value Ref Range   RPR Ser Ql Non Reactive Non Reactive    Comment: (NOTE) Performed At: [facility] [place] [address] [place] [id number] Jolene SchimkeNagendra Sanjai MD YQ:[id number]:[phone]   Chlamydia/NGC rt PCR ([facility] only)     Status: None   Collection Time: 06/29/17  6:10 AM  Result Value Ref Range   Specimen source GC/Chlam URINE, RANDOM    Chlamydia Tr NOT DETECTED NOT DETECTED   N gonorrhoeae NOT DETECTED NOT DETECTED    Comment: (NOTE) 100  This methodology has not been  evaluated in pregnant women or in 200  patients with a history of hysterectomy. 300 400  This methodology will not be performed on patients less than 2314  years of age.   Rapid HIV screen (HIV 1/2 Ab+Ag) ([facility] Only)     Status: None   Collection Time: 06/29/17  6:10 AM  Result Value Ref Range   HIV-1 P24 Antigen - HIV24 NON REACTIVE NON REACTIVE   HIV 1/2 Antibodies NON REACTIVE NON REACTIVE   Interpretation (HIV Ag Ab)      A non reactive test result means that HIV 1 or HIV 2 antibodies and HIV 1 p24 antigen were not detected in the specimen.  CBC     Status: Abnormal   Collection Time: 07/01/17  1:46 PM  Result Value Ref Range   WBC 12.7 (H) 3.6 - 11.0 K/uL   RBC 3.36 (L) 3.80 - 5.20 MIL/uL   Hemoglobin 7.7 (L) 12.0 - 16.0 g/dL   HCT 52.824.5 (L) 41.335.0 - 24.447.0 %   MCV 73.1 (L) 80.0 - 100.0 fL   MCH 22.9 (L) 26.0 - 34.0 pg   MCHC 31.3 (L) 32.0 - 36.0 g/dL   RDW 01.016.4 (H) 27.211.5 - 53.614.5 %   Platelets 145 (L) 150 - 440  K/uL     Assessment:   26 y.o. G1P0 postoperative day #0 in stable condition.   Plan:  1) Acute blood loss anemia - hemodynamically stable and asymptomatic - PO ferrous sulfate  2) Blood Type --/--/A POS (12/13 09810610) / Rubella Immune (05/12 0000) / Varicella immune  3) TDAP status: given 03/24/2017; Flu vaccine 03/24/2017.  4) Breast feeding  5) Contraception: OCP  6) Disposition: continue postpartum care. Some tachycardia and low grade temperature elevations. Q2 temperatures.  Marcelyn BruinsJacelyn Jahziah Simonin, CNM 07/01/2017  5:07 PM

## 2017-07-01 NOTE — Op Note (Signed)
Cesarean Section Operative Note    Rebecca Walton   07/01/2017   Pre-operative Diagnosis:  1) intrauterine pregnancy at 5724w3d  2) arrest of dilation   Post-operative Diagnosis:  1) intrauterine pregnancy at 3524w3d  2) arrest of dilation  Procedure: Primary Low Transverse Cesarean Section via Pfannenstiel incision with double-layer uterine closure  Surgeon: Surgeon(s) and Role:    Conard Novak* Kenai Fluegel D, MD - Primary   Assistants: Farrel Connersolleen Gutierrez, CNM  Anesthesia: epidural   Findings:  1) normal appearing gravid uterus, fallopian tubes, and ovaries 2) viable female infant with APGARs of 9 and 9 with weight of 3,570 grams   Estimated Blood Loss: 1,000 mL  Total IV Fluids: 700 ml crystalloid  Urine Output: 50 mL clear urine at end of procedure  Specimens: none  Complications: no complications  Disposition: PACU - hemodynamically stable.   Maternal Condition: stable   Baby condition / location:  Couplet care / Skin to Skin  Procedure Details:  The patient was seen in the Holding Room. The risks, benefits, complications, treatment options, and expected outcomes were discussed with the patient. The patient concurred with the proposed plan, giving informed consent. identified as Rebecca Walton and the procedure verified as C-Section Delivery. A Time Out was held and the above information confirmed.   After induction of anesthesia, the patient was draped and prepped in the usual sterile manner. A Pfannenstiel incision was made and carried down through the subcutaneous tissue to the fascia. Fascial incision was made and extended transversely. The fascia was separated from the underlying rectus tissue superiorly and inferiorly. The peritoneum was identified and entered. Peritoneal incision was extended longitudinally. The bladder flap was sharply freed from the lower uterine segment. A low transverse uterine incision was made and the hysterotomy was extended with  cranial-caudal tension. Delivered from cephalic presentation was a 3,570 gram Living newborn infant(s) or Female with Apgar scores of 9 at one minute and 9 at five minutes. Cord ph was not sent the umbilical cord was clamped and cut cord blood was not obtained for evaluation. The placenta was removed Intact and appeared normal. The uterine outline, tubes and ovaries appeared normal. The uterine incision was closed with running locked sutures of 0 Vicryl.  A second layer of the same suture was thrown in an imbricating fashion.  Hemostasis was assured.  The uterus was returned to the abdomen and the paracolic gutters were cleared of all clots and debris.  The peritoneum was reapproximated using 0-Vicryl in a running fashion.  The rectus muscles were inspected and found to be hemostatic.  The On-Q catheter pumps were inserted in accordance with the manufacturer's recommendations.  The catheters were inserted approximately 4cm cephelad to the incision line, approximately 1cm apart, straddling the midline.  They were inserted to a depth of the 4th mark. They were positioned superficial to the rectus abdominus muscles and deep to the rectus fascia.    The fascia was then reapproximated with running sutures of 1-0 PDS, looped. The subcuticular closure was performed using 4-0 monocryl. The skin closure was reinforced using surgical skin glue.   The On-Q catheters were bolused with 5 mL of 0.5% marcaine plain for a total of 10 mL.  The catheters were affixed to the skin with surgical skin glue, steri-strips, and tegaderm.    Instrument, sponge, and needle counts were correct prior the abdominal closure and were correct at the conclusion of the case.  The patient received Ancef 2 gram IV  and Azithromycin 500 mg IV prior to skin incision (within 30 minutes). For VTE prophylaxis she was wearing SCDs throughout the case.   Signed: Conard NovakStephen D. Ulises Wolfinger, MD 07/01/2017 3:20 AM

## 2017-07-02 LAB — CBC
HCT: 22 % — ABNORMAL LOW (ref 35.0–47.0)
HEMATOCRIT: 25.3 % — AB (ref 35.0–47.0)
HEMOGLOBIN: 8.2 g/dL — AB (ref 12.0–16.0)
Hemoglobin: 6.9 g/dL — ABNORMAL LOW (ref 12.0–16.0)
MCH: 22.8 pg — AB (ref 26.0–34.0)
MCH: 24.1 pg — ABNORMAL LOW (ref 26.0–34.0)
MCHC: 31.1 g/dL — ABNORMAL LOW (ref 32.0–36.0)
MCHC: 32.3 g/dL (ref 32.0–36.0)
MCV: 73.3 fL — ABNORMAL LOW (ref 80.0–100.0)
MCV: 74.6 fL — AB (ref 80.0–100.0)
PLATELETS: 147 10*3/uL — AB (ref 150–440)
Platelets: 158 10*3/uL (ref 150–440)
RBC: 3.01 MIL/uL — AB (ref 3.80–5.20)
RBC: 3.39 MIL/uL — ABNORMAL LOW (ref 3.80–5.20)
RDW: 16.6 % — AB (ref 11.5–14.5)
RDW: 16.8 % — AB (ref 11.5–14.5)
WBC: 13.6 10*3/uL — ABNORMAL HIGH (ref 3.6–11.0)
WBC: 13.2 10*3/uL — AB (ref 3.6–11.0)

## 2017-07-02 LAB — PREPARE RBC (CROSSMATCH)

## 2017-07-02 LAB — ABO/RH: ABO/RH(D): A POS

## 2017-07-02 MED ORDER — DIPHENHYDRAMINE HCL 50 MG/ML IJ SOLN
25.0000 mg | Freq: Once | INTRAMUSCULAR | Status: AC
  Administered 2017-07-02: 25 mg via INTRAVENOUS
  Filled 2017-07-02: qty 1

## 2017-07-02 MED ORDER — ACETAMINOPHEN 325 MG PO TABS
650.0000 mg | ORAL_TABLET | ORAL | Status: DC | PRN
  Administered 2017-07-02 – 2017-07-03 (×2): 650 mg via ORAL
  Filled 2017-07-02 (×2): qty 2

## 2017-07-02 MED ORDER — SODIUM CHLORIDE 0.9 % IV SOLN
Freq: Once | INTRAVENOUS | Status: AC
  Administered 2017-07-02: 10:00:00 via INTRAVENOUS

## 2017-07-02 MED ORDER — PROMETHAZINE HCL 25 MG/ML IJ SOLN: 25.0000 mg | Freq: Four times a day (QID) | INTRAMUSCULAR | Status: DC | PRN

## 2017-07-02 NOTE — Progress Notes (Addendum)
Pt. States she feels' " Better". States she thinks a cheese burger she had brought in had made her feel sick. Denies nausea at this time and is tolerating cracker. Assisted up to void and tolerated fairly well. Will try one Percocet as per Pt. Request for Pain rating of "4".

## 2017-07-02 NOTE — Progress Notes (Signed)
   Subjective:  Doing well no complaints.  Pain well controlled, no further nausea or emesis after starting Zofran.  Denies fevers or chills.    Objective:  Blood pressure 123/71, pulse 100, temperature 98.4 F (36.9 C), temperature source Oral, resp. rate (!) 23, height 5' (1.524 m), weight 192 lb (87.1 kg), last menstrual period 09/03/2016, SpO2 100 %.  Intake/Output      12/15 0701 - 12/16 0700 12/16 0701 - 12/17 0700   P.O. 480    I.V. (mL/kg) 1774 (20.4)    Total Intake(mL/kg) 2254 (25.9)    Urine (mL/kg/hr) 2600 (1.2)    Blood     Total Output 2600    Net -346           General: NAD Pulmonary: no increased work of breathing, lungs CTAB Abdomen: Mildly distended and tympanic, non-tender, fundus firm at level of umbilicus Incision: D/C/I Extremities: no edema, no erythema, no tenderness  Results for orders placed or performed during the hospital encounter of 06/29/17 (from the past 72 hour(s))  CBC     Status: Abnormal   Collection Time: 07/01/17  1:46 PM  Result Value Ref Range   WBC 12.7 (H) 3.6 - 11.0 K/uL   RBC 3.36 (L) 3.80 - 5.20 MIL/uL   Hemoglobin 7.7 (L) 12.0 - 16.0 g/dL   HCT 40.924.5 (L) 81.135.0 - 91.447.0 %   MCV 73.1 (L) 80.0 - 100.0 fL   MCH 22.9 (L) 26.0 - 34.0 pg   MCHC 31.3 (L) 32.0 - 36.0 g/dL   RDW 78.216.4 (H) 95.611.5 - 21.314.5 %   Platelets 145 (L) 150 - 440 K/uL  CBC     Status: Abnormal   Collection Time: 07/02/17  6:18 AM  Result Value Ref Range   WBC 13.6 (H) 3.6 - 11.0 K/uL   RBC 3.01 (L) 3.80 - 5.20 MIL/uL   Hemoglobin 6.9 (L) 12.0 - 16.0 g/dL   HCT 08.622.0 (L) 57.835.0 - 46.947.0 %   MCV 73.3 (L) 80.0 - 100.0 fL   MCH 22.8 (L) 26.0 - 34.0 pg   MCHC 31.1 (L) 32.0 - 36.0 g/dL   RDW 62.916.6 (H) 52.811.5 - 41.314.5 %   Platelets 147 (L) 150 - 440 K/uL     Assessment:   26 y.o. G1P0 postoperativeday # 1 1LTCS for failure to progress   Plan:  1) Acute blood loss anemia - remains slightly tachycardia - po ferrous sulfate - 1U pRBC today repeat CBC 1800  2) Blood Type  --/--/A POS (12/13 24400610) / Rubella Immune (05/12 0000) / Varicella Immune  3) TDAP status UTD  4) Breast/OCP  5) Disposition - anticipate discharge PPD#3

## 2017-07-02 NOTE — Anesthesia Postprocedure Evaluation (Signed)
Anesthesia Post Note  Patient: Rebecca Walton  Procedure(s) Performed: CESAREAN SECTION (N/A )  Patient location during evaluation: Mother Baby Anesthesia Type: Spinal Level of consciousness: awake and alert Pain management: pain level controlled Vital Signs Assessment: post-procedure vital signs reviewed and stable Respiratory status: spontaneous breathing, nonlabored ventilation and respiratory function stable Cardiovascular status: stable Postop Assessment: no headache and no backache Anesthetic complications: no     Last Vitals:  Vitals:   07/02/17 1030 07/02/17 1105  BP: 138/78 140/83  Pulse: (!) 112 (!) 110  Resp: (!) 28 (!) 25  Temp: 37.4 C 37.7 C  SpO2: 96% 98%    Last Pain:  Vitals:   07/02/17 1105  TempSrc: Oral  PainSc:                  KEPHART,WILLIAM K

## 2017-07-02 NOTE — Anesthesia Post-op Follow-up Note (Signed)
  Anesthesia Pain Follow-up Note  Patient: Rebecca Walton  Day #: 1  Date of Follow-up: 07/02/2017 Time: 11:34 AM  Last Vitals:  Vitals:   07/02/17 1030 07/02/17 1105  BP: 138/78 140/83  Pulse: (!) 112 (!) 110  Resp: (!) 28 (!) 25  Temp: 37.4 C 37.7 C  SpO2: 96% 98%    Level of Consciousness: lethargic  Pain: 3 /10   Side Effects:None  Catheter Site Exam:clean, dry, no drainage     Plan: D/C from anesthesia care at surgeon's request  KEPHART,WILLIAM K

## 2017-07-02 NOTE — Progress Notes (Addendum)
Pt. With clear emesis at 240, mod. Amt.Tillman Sers. J Schmid CNM paged at [phone] for antiemetic order since it is too soon for PRN Zofran. Called L&D at 0253 and was informed that Tillman SersJ. Schmid CNM is in a C-Section. Reassessed Pt. After this and she was sleeping and appeared comfortable and in NAD. Pt. Temp. At 0254 was 99.5 P.O. HR reg. At 102. RR unlabored at 20 and B/P was 115/56. B/P was post IV Morphine at 0142.  O2 Sat's have been in mid to high 90's. Assisted Pt. Up to bedside commode and she voided 700cc of cl., yellow urine. [facility] Color is pale, skin w&d. BBS cl. She does have a frequent, non-prod. Cough that Pt. States she has had X1 week. She does the I.S. With encouragement and can reach 1700. BBS clear. Abd. Is soft, sl. Distended with hypoactive B.S. Fundus is firm at u/-1 and lochial flow has been small. Lower abd. Dsg. In place and without s/s complications. Will cont. To monitor temp., BP, n&v and any other c/o. Pt. Was placed on CR monitor with pulse Ox. Continuous at 2300.

## 2017-07-02 NOTE — Progress Notes (Addendum)
Dr. Bonney AidStaebler and Tillman SersJ. Schmid CNM here to see Pt. No new orders received. Will cont. To watch pt. Closely.

## 2017-07-03 ENCOUNTER — Encounter: Payer: Self-pay | Admitting: Obstetrics and Gynecology

## 2017-07-03 DIAGNOSIS — O9081 Anemia of the puerperium: Secondary | ICD-10-CM | POA: Diagnosis not present

## 2017-07-03 HISTORY — DX: Anemia of the puerperium: O90.81

## 2017-07-03 LAB — TYPE AND SCREEN
ABO/RH(D): A POS
Antibody Screen: NEGATIVE
UNIT DIVISION: 0

## 2017-07-03 LAB — BPAM RBC
Blood Product Expiration Date: 201901092359
ISSUE DATE / TIME: 201812161031
Unit Type and Rh: 6200

## 2017-07-03 MED ORDER — IBUPROFEN 600 MG PO TABS: 600.0000 mg | ORAL_TABLET | Freq: Four times a day (QID) | ORAL | 2 refills | Status: DC | PRN

## 2017-07-03 MED ORDER — ACETAMINOPHEN 325 MG PO TABS: 650.0000 mg | ORAL_TABLET | ORAL | Status: DC | PRN

## 2017-07-03 MED ORDER — LEVONORGESTREL-ETHINYL ESTRAD 0.1-20 MG-MCG PO TABS: 1.0000 | ORAL_TABLET | Freq: Every day | ORAL | 5 refills | Status: DC

## 2017-07-03 MED ORDER — FERROUS SULFATE 325 (65 FE) MG PO TBEC: 325.0000 mg | DELAYED_RELEASE_TABLET | Freq: Every day | ORAL | Status: DC

## 2017-07-03 MED ORDER — OXYCODONE HCL 5 MG PO TABS: ORAL_TABLET | ORAL | 0 refills | Status: DC

## 2017-07-03 NOTE — Progress Notes (Signed)
Reviewed all patients discharge instructions and handouts regarding postpartum bleeding, no intercourse for 6 weeks, signs and symptoms of mastitis and postpartum bleu's. Provided patient with written and verbal instructions on proper removal of on-q pump, along with the proper incision care and hygiene kit. Reviewed discharge instructions for newborn regarding proper cord care, how and when to bathe the newborn, nail care, proper way to take the baby's temperature, along with safe sleep. All questions have been answered at this time.

## 2017-07-03 NOTE — Progress Notes (Signed)
ID bands checked. Patient discharged home with infant via wheelchair by nursing/auxillary.   Oswald HillockAbigail Garner, RN

## 2017-07-03 NOTE — Progress Notes (Signed)
Pt. Has had 2 liquid stools in past 4 hours. Folowig the C-Diff guidelines, I will notify the provider if she has a third liquid stool Pt. Did have N&V yesterday but states she feels better tonight and has tolerated a reg. Diet and is taking adequate fluids by mouth. Will cont. To follow closely.

## 2017-07-03 NOTE — Plan of Care (Signed)
Alert and oriented with aprop. Affect. Color sl. Pale, skin w&d. Has been afebrile this shift and all other VS wnl. Denies nausea; however; she has had two liquid stools. Pt. States she feels better today and has been actively participating in her care and in Infant's care. Pt. Is s/p PRBC Transfusion and HGB has risen from 6.9 to 8.2 and HCT rose from 22 to 25.3 Post Transfusion. BBS are clear and Pt is not coughing as frequently this evening. She does her I.S. With encouragement. Lower abd. Honeycomb dressing d&I. Lochial flow is scant and fundus is at U/-1. She states her pain is controled with On Q pump.

## 2017-07-03 NOTE — Progress Notes (Signed)
Pt. Has had third liquid stool this shift. Reported this to Tillman SersJ. Schmid and order received to place Pt. On Enteric precautions and sent stool for C Diff. And Culture if Pt. Has two more liquid stools. Pt. Informed and v/o.

## 2017-07-07 ENCOUNTER — Ambulatory Visit (INDEPENDENT_AMBULATORY_CARE_PROVIDER_SITE_OTHER): Payer: Medicaid Other | Admitting: Obstetrics and Gynecology

## 2017-07-07 ENCOUNTER — Encounter: Payer: Self-pay | Admitting: Obstetrics and Gynecology

## 2017-07-07 VITALS — BP 118/74 | Ht 60.0 in | Wt 190.0 lb

## 2017-07-07 DIAGNOSIS — Z09 Encounter for follow-up examination after completed treatment for conditions other than malignant neoplasm: Secondary | ICD-10-CM

## 2017-07-07 NOTE — Progress Notes (Signed)
   Postoperative Follow-up Patient presents post op from cesarean section  7days ago.  Subjective: She denies fever, chills, nausea and vomiting. Eating a regular diet without difficulty. The patient is not having any pain.  Activity: normal activities of daily living. She denies issues with her incision.    Objective: BP 118/74   Ht 5' (1.524 m)   Wt 190 lb (86.2 kg)   BMI 37.11 kg/m   Constitutional: Well nourished, well developed female in no acute distress.  HEENT: normal Skin: Warm and dry.  Abdomen: S/NT/NT, fundus at U-2 clean, dry, intact and no erythema, induration, warmth, and tenderness Extremity: no edema   Assessment: 26 y.o. s/p cesarean section progressing well  Plan: Patient has done well after surgery with no apparent complications.  I have discussed the post-operative course to date, and the expected progress moving forward.  The patient understands what complications to be concerned about.    Activity plan: increase slowly  Return in about 5 weeks (around 08/11/2017) for 6 week postpartum.  Thomasene MohairStephen Kimmora Risenhoover, MD 07/07/2017 1:51 PM

## 2017-08-01 ENCOUNTER — Emergency Department: Payer: Medicaid Other

## 2017-08-01 ENCOUNTER — Emergency Department
Admission: EM | Admit: 2017-08-01 | Discharge: 2017-08-01 | Disposition: A | Discharge status: Home / Self Care | Payer: Medicaid Other | Attending: Student in an Organized Health Care Education/Training Program | Admitting: Student in an Organized Health Care Education/Training Program

## 2017-08-01 ENCOUNTER — Encounter: Payer: Self-pay | Admitting: Medical Oncology

## 2017-08-01 DIAGNOSIS — R1011 Right upper quadrant pain: Secondary | ICD-10-CM | POA: Insufficient documentation

## 2017-08-01 DIAGNOSIS — Z7722 Contact with and (suspected) exposure to environmental tobacco smoke (acute) (chronic): Secondary | ICD-10-CM | POA: Diagnosis not present

## 2017-08-01 DIAGNOSIS — Z79899 Other long term (current) drug therapy: Secondary | ICD-10-CM | POA: Diagnosis not present

## 2017-08-01 LAB — CBC
HCT: 35.1 % (ref 35.0–47.0)
Hemoglobin: 10.9 g/dL — ABNORMAL LOW (ref 12.0–16.0)
MCH: 22.7 pg — AB (ref 26.0–34.0)
MCHC: 31 g/dL — AB (ref 32.0–36.0)
MCV: 73.3 fL — AB (ref 80.0–100.0)
PLATELETS: 191 10*3/uL (ref 150–440)
RBC: 4.79 MIL/uL (ref 3.80–5.20)
RDW: 17.3 % — ABNORMAL HIGH (ref 11.5–14.5)
WBC: 8.5 10*3/uL (ref 3.6–11.0)

## 2017-08-01 LAB — COMPREHENSIVE METABOLIC PANEL
ALK PHOS: 132 U/L — AB (ref 38–126)
ALT: 20 U/L (ref 14–54)
AST: 40 U/L (ref 15–41)
Albumin: 4 g/dL (ref 3.5–5.0)
Anion gap: 9 (ref 5–15)
BUN: 11 mg/dL (ref 6–20)
CHLORIDE: 106 mmol/L (ref 101–111)
CO2: 25 mmol/L (ref 22–32)
CREATININE: 0.66 mg/dL (ref 0.44–1.00)
Calcium: 8.8 mg/dL — ABNORMAL LOW (ref 8.9–10.3)
GFR calc Af Amer: >60 mL/min (ref >60)
GFR calc non Af Amer: >60 mL/min (ref >60)
Glucose, Bld: 103 mg/dL — ABNORMAL HIGH (ref 65–99)
Potassium: 3.6 mmol/L (ref 3.5–5.1)
Sodium: 140 mmol/L (ref 135–145)
Total Bilirubin: 0.8 mg/dL (ref 0.3–1.2)
Total Protein: 7.4 g/dL (ref 6.5–8.1)

## 2017-08-01 LAB — URINALYSIS, COMPLETE (UACMP) WITH MICROSCOPIC
Bacteria, UA: NONE SEEN
Bilirubin Urine: NEGATIVE
Glucose, UA: NEGATIVE mg/dL
Hgb urine dipstick: NEGATIVE
KETONES UR: NEGATIVE mg/dL
Leukocytes, UA: NEGATIVE
Nitrite: NEGATIVE
PH: 6 (ref 5.0–8.0)
Protein, ur: NEGATIVE mg/dL
SPECIFIC GRAVITY, URINE: 1.028 (ref 1.005–1.030)

## 2017-08-01 LAB — LIPASE, BLOOD: LIPASE: 27 U/L (ref 11–51)

## 2017-08-01 LAB — POCT PREGNANCY, URINE: Preg Test, Ur: NEGATIVE

## 2017-08-01 MED ORDER — KETOROLAC TROMETHAMINE 30 MG/ML IJ SOLN
15.0000 mg | Freq: Once | INTRAMUSCULAR | Status: AC
  Administered 2017-08-01: 15 mg via INTRAMUSCULAR
  Filled 2017-08-01: qty 1

## 2017-08-01 MED ORDER — DICYCLOMINE HCL 10 MG PO CAPS: 10.0000 mg | ORAL_CAPSULE | Freq: Three times a day (TID) | ORAL | 0 refills | Status: DC | PRN

## 2017-08-01 NOTE — ED Provider Notes (Signed)
[facility] Emergency Department Provider Note    First MD Initiated Contact with Patient 08/01/17 1750     (approximate)  I have reviewed the triage vital signs and the nursing notes.   HISTORY  Chief Complaint Abdominal Pain    HPI Rebecca Walton is a 27 y.o. female since with 3 days of intermittent right upper quadrant pain that is crampy in nature and moderate in severity.  Is never had pain like this before.  No radiation of pain.  No vomiting but has had nausea.  States the pain was worse after eating.  She is 1 month status post cesarean section.  States she was having diarrhea and thinks that she saw some blood in her stool today.  Denies any right lower quadrant pain.  She denies any vaginal discharge or vaginal bleeding.  No dysuria.  Past Medical History:  Diagnosis Date  . Medical history non-contributory    No family history on file. Past Surgical History:  Procedure Laterality Date  . CESAREAN SECTION N/A 06/29/2017   Procedure: CESAREAN SECTION;  Surgeon: Conard Novak, MD;  Location: [facility] ORS;  Service: Obstetrics;  Laterality: N/A;   Patient Active Problem List   Diagnosis Date Noted  . Postpartum care following cesarean delivery 07/03/2017  . Postpartum anemia 07/03/2017  . Failure to progress in labor, delivered, current hospitalization 07/03/2017  . Encounter for planned induction of labor 06/29/2017  . NST (non-stress test) reactive 06/28/2017      Prior to Admission medications   Medication Sig Start Date End Date Taking? Authorizing Provider  acetaminophen (TYLENOL) 325 MG tablet Take 2 tablets (650 mg total) by mouth every 4 (four) hours as needed for mild pain. 07/03/17   Farrel Conners, CNM  dicyclomine (BENTYL) 10 MG capsule Take 1 capsule (10 mg total) by mouth 3 (three) times daily as needed for up to 14 days for spasms. 08/01/17 08/15/17  Willy Eddy, MD  ferrous sulfate 325 (65 FE) MG EC tablet Take 1  tablet (325 mg total) by mouth daily with breakfast. 07/03/17   Farrel Conners, CNM  ibuprofen (ADVIL,MOTRIN) 600 MG tablet Take 1 tablet (600 mg total) by mouth every 6 (six) hours as needed for headache, mild pain or cramping. 07/03/17   Farrel Conners, CNM  levonorgestrel-ethinyl estradiol (AVIANE,ALESSE,LESSINA) 0.1-20 MG-MCG tablet Take 1 tablet by mouth daily. 07/30/17 07/30/18  Farrel Conners, CNM  oxyCODONE (ROXICODONE) 5 MG immediate release tablet Take one tablet every 4-6 hours prn moderate to severe pain 07/03/17   Farrel Conners, CNM  Prenatal Vit-Fe Fumarate-FA (MULTIVITAMIN-PRENATAL) 27-0.8 MG TABS tablet Take 1 tablet by mouth daily at 12 noon.    [provider]    Allergies Coconut flavor    Social History Social History   Tobacco Use  . Smoking status: Passive Smoke Exposure - Never Smoker  . Smokeless tobacco: Never Used  Substance Use Topics  . Alcohol use: No  . Drug use: No    Review of Systems Patient denies headaches, rhinorrhea, blurry vision, numbness, shortness of breath, chest pain, edema, cough, abdominal pain, nausea, vomiting, diarrhea, dysuria, fevers, rashes or hallucinations unless otherwise stated above in HPI. ____________________________________________   PHYSICAL EXAM:  VITAL SIGNS: Vitals:   08/01/17 1544  BP: 112/60  Pulse: 73  Resp: 18  Temp: 98.1 F (36.7 C)  SpO2: 100%    Constitutional: Alert and oriented. Well appearing and in no acute distress. Eyes: Conjunctivae are normal.  Head: Atraumatic. Nose: No  congestion/rhinnorhea. Mouth/Throat: Mucous membranes are moist.   Neck: No stridor. Painless ROM.  Cardiovascular: Normal rate, regular rhythm. Grossly normal heart sounds.  Good peripheral circulation. Respiratory: Normal respiratory effort.  No retractions. Lungs CTAB. Gastrointestinal: Soft and nontender. No distention. No abdominal bruits. No CVA tenderness. Genitourinary:  Musculoskeletal: No  lower extremity tenderness nor edema.  No joint effusions. Neurologic:  Normal speech and language. No gross focal neurologic deficits are appreciated. No facial droop Skin:  Skin is warm, dry and intact. No rash noted. Psychiatric: Mood and affect are normal. Speech and behavior are normal.  ____________________________________________   LABS (all labs ordered are listed, but only abnormal results are displayed)  Results for orders placed or performed during the hospital encounter of 08/01/17 (from the past 24 hour(s))  Urinalysis, Complete w Microscopic     Status: Abnormal   Collection Time: 08/01/17  3:45 PM  Result Value Ref Range   Color, Urine YELLOW (A) YELLOW   APPearance HAZY (A) CLEAR   Specific Gravity, Urine 1.028 1.005 - 1.030   pH 6.0 5.0 - 8.0   Glucose, UA NEGATIVE NEGATIVE mg/dL   Hgb urine dipstick NEGATIVE NEGATIVE   Bilirubin Urine NEGATIVE NEGATIVE   Ketones, ur NEGATIVE NEGATIVE mg/dL   Protein, ur NEGATIVE NEGATIVE mg/dL   Nitrite NEGATIVE NEGATIVE   Leukocytes, UA NEGATIVE NEGATIVE   RBC / HPF 0-5 0 - 5 RBC/hpf   WBC, UA 0-5 0 - 5 WBC/hpf   Bacteria, UA NONE SEEN NONE SEEN   Squamous Epithelial / LPF 0-5 (A) NONE SEEN   Mucus PRESENT   Lipase, blood     Status: None   Collection Time: 08/01/17  3:58 PM  Result Value Ref Range   Lipase 27 11 - 51 U/L  Comprehensive metabolic panel     Status: Abnormal   Collection Time: 08/01/17  3:58 PM  Result Value Ref Range   Sodium 140 135 - 145 mmol/L   Potassium 3.6 3.5 - 5.1 mmol/L   Chloride 106 101 - 111 mmol/L   CO2 25 22 - 32 mmol/L   Glucose, Bld 103 (H) 65 - 99 mg/dL   BUN 11 6 - 20 mg/dL   Creatinine, Ser 1.610.66 0.44 - 1.00 mg/dL   Calcium 8.8 (L) 8.9 - 10.3 mg/dL   Total Protein 7.4 6.5 - 8.1 g/dL   Albumin 4.0 3.5 - 5.0 g/dL   AST 40 15 - 41 U/L   ALT 20 14 - 54 U/L   Alkaline Phosphatase 132 (H) 38 - 126 U/L   Total Bilirubin 0.8 0.3 - 1.2 mg/dL   GFR calc non Af Amer >60 >60 mL/min   GFR  calc Af Amer >60 >60 mL/min   Anion gap 9 5 - 15  CBC     Status: Abnormal   Collection Time: 08/01/17  3:58 PM  Result Value Ref Range   WBC 8.5 3.6 - 11.0 K/uL   RBC 4.79 3.80 - 5.20 MIL/uL   Hemoglobin 10.9 (L) 12.0 - 16.0 g/dL   HCT 09.635.1 04.535.0 - 40.947.0 %   MCV 73.3 (L) 80.0 - 100.0 fL   MCH 22.7 (L) 26.0 - 34.0 pg   MCHC 31.0 (L) 32.0 - 36.0 g/dL   RDW 81.117.3 (H) 91.411.5 - 78.214.5 %   Platelets 191 150 - 440 K/uL  Pregnancy, urine POC     Status: None   Collection Time: 08/01/17  4:11 PM  Result Value Ref Range   Preg Test,  Ur NEGATIVE NEGATIVE   ____________________________________________ ____________________________________________  RADIOLOGY  I personally reviewed all radiographic images ordered to evaluate for the above acute complaints and reviewed radiology reports and findings.  These findings were personally discussed with the patient.  Please see medical record for radiology report.  ____________________________________________   PROCEDURES  Procedure(s) performed:  Procedures    Critical Care performed: no ____________________________________________   INITIAL IMPRESSION / ASSESSMENT AND PLAN / ED COURSE  Pertinent labs & imaging results that were available during my care of the patient were reviewed by me and considered in my medical decision making (see chart for details).  DDX: Lithiasis, cholecystitis, pancreatitis, enteritis, SBO, musculoskeletal strain, biliary colic  CARMALETA YOUNGERS is a 27 y.o. who presents to the ED with right upper quadrant abdominal pain as described above.  Patient is well-appearing and in no acute distress.  Blood work sent for the above differential was reassuring.  No evidence of UTI.  Patient is not pregnant.  Right upper quadrant ultrasound ordered to evaluate for above differential shows no acute abnormality.  There is no evidence of obstructive process.  Presentation most likely secondary to biliary colic.  Discussed findings of  ultrasound with patient and discussed need for follow-up with PCP and surgery as an outpatient.  Discussed signs and symptoms for which she should return immediately to the hospital.  Have discussed with the patient and available family all diagnostics and treatments performed thus far and all questions were answered to the best of my ability. The patient demonstrates understanding and agreement with plan.       ____________________________________________   FINAL CLINICAL IMPRESSION(S) / ED DIAGNOSES  Final diagnoses:  RUQ abdominal pain  Right upper quadrant abdominal pain      NEW MEDICATIONS STARTED DURING THIS VISIT:  New Prescriptions   DICYCLOMINE (BENTYL) 10 MG CAPSULE    Take 1 capsule (10 mg total) by mouth 3 (three) times daily as needed for up to 14 days for spasms.     Note:  This document was prepared using Dragon voice recognition software and may include unintentional dictation errors.    Willy Eddy, MD 08/01/17 [phone]

## 2017-08-01 NOTE — ED Notes (Signed)
Pt has right side upper abd pain for 3 days.  No v/d  Pt reports nausea.  Pt reports blood in stool.  csection 1 month ago. Pt alert

## 2017-08-01 NOTE — ED Triage Notes (Signed)
Pt reports that she has been having right sided abd pain x 3 days. Reports nausea and blood in her stool.

## 2017-08-01 NOTE — Discharge Instructions (Signed)

## 2017-08-07 ENCOUNTER — Ambulatory Visit (INDEPENDENT_AMBULATORY_CARE_PROVIDER_SITE_OTHER): Payer: Medicaid Other | Admitting: Surgery

## 2017-08-07 ENCOUNTER — Encounter: Payer: Self-pay | Admitting: Surgery

## 2017-08-07 VITALS — BP 116/70 | HR 80 | Temp 97.7°F | Ht 60.0 in | Wt 175.0 lb

## 2017-08-07 DIAGNOSIS — K8020 Calculus of gallbladder without cholecystitis without obstruction: Secondary | ICD-10-CM | POA: Insufficient documentation

## 2017-08-07 HISTORY — DX: Calculus of gallbladder without cholecystitis without obstruction: K80.20

## 2017-08-07 NOTE — H&P (View-Only) (Signed)
Surgical Clinic History and Physical  Referring provider:  Department, [facility] [address] [place], [place] [id number]  HISTORY OF PRESENT ILLNESS (HPI):  27 y.o. female (accompanied by her mother) presents for evaluation of RUQ abdominal pain. Patient reports she first noticed the pain 4 - 5 weeks ago, following the birth of her first daughter (c-section 5 weeks ago). Patient describes that her pain has been worse in the evenings, typically following foods containing more fat (meats, cheeses/dairy, and fried foods). She says the pain was relatively mild at first, but worsened, prompting her to seek evaluation [facility] 6 days ago. Since having been advised to decrease consumption of fatty foods, she says her pain has resolved, but she expresses anxiety regarding return of her severe pain if she eats "the wrong food". Of note, patient says she is not breast feeding her daughter, and she otherwise denies N/V, fever/chills, CP, or SOB and denies any prior similar episodes/symptoms.  PAST MEDICAL HISTORY (PMH):  Past Medical History:  Diagnosis Date  . Medical history non-contributory      PAST SURGICAL HISTORY (PSH):  Past Surgical History:  Procedure Laterality Date  . CESAREAN SECTION N/A 06/29/2017   Procedure: CESAREAN SECTION;  Surgeon: Conard NovakJackson, Stephen D, MD;  Location: [facility] ORS;  Service: Obstetrics;  Laterality: N/A;     MEDICATIONS:  Prior to Admission medications   Not on File     ALLERGIES:  Allergies  Allergen Reactions  . Dicyclomine Itching  . Coconut Flavor      SOCIAL HISTORY:  Social History   Socioeconomic History  . Marital status: Single    Spouse name: Not on file  . Number of children: Not on file  . Years of education: Not on file  . Highest education level: Not on file  Social Needs  . Financial resource strain: Not on file  . Food insecurity - worry: Not on file  . Food insecurity - inability: Not on file  .  Transportation needs - medical: Not on file  . Transportation needs - non-medical: Not on file  Occupational History  . Not on file  Tobacco Use  . Smoking status: Passive Smoke Exposure - Never Smoker  . Smokeless tobacco: Never Used  Substance and Sexual Activity  . Alcohol use: No  . Drug use: No  . Sexual activity: Yes  Other Topics Concern  . Not on file  Social History Narrative  . Not on file    The patient currently resides (home / rehab facility / nursing home): Home The patient normally is (ambulatory / bedbound): Ambulatory  FAMILY HISTORY:  No family history on file.  Otherwise negative/non-contributory.  REVIEW OF SYSTEMS:  Constitutional: denies any other weight loss, fever, chills, or sweats  Eyes: denies any other vision changes, history of eye injury  ENT: denies sore throat, hearing problems  Respiratory: denies shortness of breath, wheezing  Cardiovascular: denies chest pain, palpitations  Gastrointestinal: abdominal pain, N/V, and bowel function as per HPI Musculoskeletal: denies any other joint pains or cramps  Skin: Denies any other rashes or skin discolorations except as per HPI Neurological: denies any other headache, dizziness, weakness  Psychiatric: Denies any other depression, anxiety   All other review of systems were otherwise negative   VITAL SIGNS:  BP 116/70   Pulse 80   Temp 97.7 F (36.5 C) (Oral)   Ht 5' (1.524 m)   Wt 175 lb (79.4 kg)   BMI 34.18  kg/m   PHYSICAL EXAM:  Constitutional:  -- Obese body habitus  -- Awake, alert, and oriented x3  Eyes:  -- Pupils equally round and reactive to light  -- No scleral icterus  Ear, nose, throat:  -- No jugular venous distension -- No nasal drainage, bleeding Pulmonary:  -- No crackles  -- Equal breath sounds bilaterally -- Breathing non-labored at rest Cardiovascular:  -- S1, S2 present  -- No pericardial rubs  Gastrointestinal:  -- Abdomen soft, nontender, non-distended, no  guarding/rebound -- No abdominal masses appreciated, pulsatile or otherwise; c-section site healing well Musculoskeletal and Integumentary:  -- Wounds or skin discoloration: None appreciated except as above (GI) -- Extremities: B/L UE and LE FROM, hands and feet warm Neurologic:  -- Motor function: Intact and symmetric -- Sensation: Intact and symmetric  Labs:  CBC Latest Ref Rng & Units 08/01/2017 07/02/2017 07/02/2017  WBC 3.6 - 11.0 K/uL 8.5 13.2(H) 13.6(H)  Hemoglobin 12.0 - 16.0 g/dL 10.9(L) 8.2(L) 6.9(L)  Hematocrit 35.0 - 47.0 % 35.1 25.3(L) 22.0(L)  Platelets 150 - 440 K/uL 191 158 147(L)   CMP Latest Ref Rng & Units 08/01/2017 04/28/2012  Glucose 65 - 99 mg/dL 161(W) 82  BUN 6 - 20 mg/dL 11 8  Creatinine 9.60 - 1.00 mg/dL 4.54 0.98  Sodium 119 - 145 mmol/L 140 142  Potassium 3.5 - 5.1 mmol/L 3.6 4.1  Chloride 101 - 111 mmol/L 106 106  CO2 22 - 32 mmol/L 25 29  Calcium 8.9 - 10.3 mg/dL 1.4(N) 9.6  Total Protein 6.5 - 8.1 g/dL 7.4 -  Total Bilirubin 0.3 - 1.2 mg/dL 0.8 -  Alkaline Phos 38 - 126 U/L 132(H) -  AST 15 - 41 U/L 40 -  ALT 14 - 54 U/L 20 -    Imaging studies:  Limited RUQ Abdominal Ultrasound (08/01/2017) Multiple small mobile gallstones, largest measuring 6 mm. No gallbladder wall thickening or pericholecystic fluid. No sonographic Murphy's sign elicited during the exam. Common bile duct diameter measures 3 mm (within normal limits).  Assessment/Plan:  27 y.o. female with post-partum post-prandial RUQ abdominal pain, consistent with symptomatic cholelithiasis, complicated by co-morbidities including obesity (BMI >34).   - avoid/minimize foods with more fat (meats, cheeses/dairy, fried foods)  - prefer lower-fat foods such as low-fat vegetables, whole grains, and fruit   - all risks, benefits, and alternatives to cholecystectomy were discussed with the patient and her mom, all of their questions were answered to their expressed satisfaction, patient  expresses she wishes to proceed, and informed consent was obtained.  - will plan for laparoscopic cholecystectomy on Tuesday, 2/5 per patient and her mom's request  - anticipate return to clinic 2 weeks after above procedure  - instructed to call if any questions or concerns  All of the above recommendations were discussed with the patient and patient's mom, and all of patient's and family's questions were answered to their expressed satisfaction.  Thank you for the opportunity to participate in this patient's care.  -- Scherrie Gerlach Earlene Plater, MD, RPVI : [facility] General [facility] - Partnering for exceptional care. Office: [phone]

## 2017-08-07 NOTE — Patient Instructions (Signed)
You have requested to have your gallbladder removed. This will be done on 08/22/2017 at [facility] with Dr. Satira MccallumJason Davis.  You will most likely be out of work 1-2 weeks for this surgery. You will return after your post-op appointment with a lifting restriction for approximately 4 more weeks.  You will be able to eat anything you would like to following surgery. But, start by eating a bland diet and advance this as tolerated. The Gallbladder diet is below, please go as closely by this diet as possible prior to surgery to avoid any further attacks.  Please see the (blue)pre-care form that you have been given today. If you have any questions, please call our office.  Laparoscopic Cholecystectomy Laparoscopic cholecystectomy is surgery to remove the gallbladder. The gallbladder is located in the upper right part of the abdomen, behind the liver. It is a storage sac for bile, which is produced in the liver. Bile aids in the digestion and absorption of fats. Cholecystectomy is often done for inflammation of the gallbladder (cholecystitis). This condition is usually caused by a buildup of gallstones (cholelithiasis) in the gallbladder. Gallstones can block the flow of bile, and that can result in inflammation and pain. In severe cases, emergency surgery may be required. If emergency surgery is not required, you will have time to prepare for the procedure. Laparoscopic surgery is an alternative to open surgery. Laparoscopic surgery has a shorter recovery time. Your common bile duct may also need to be examined during the procedure. If stones are found in the common bile duct, they may be removed. LET [facility] KNOW ABOUT:  Any allergies you have.  All medicines you are taking, including vitamins, herbs, eye drops, creams, and over-the-counter medicines.  Previous problems you or members of your family have had with the use of anesthetics.  Any blood disorders you have.  Previous  surgeries you have had.    Any medical conditions you have. RISKS AND COMPLICATIONS Generally, this is a safe procedure. However, problems may occur, including:  Infection.  Bleeding.  Allergic reactions to medicines.  Damage to other structures or organs.  A stone remaining in the common bile duct.  A bile leak from the cyst duct that is clipped when your gallbladder is removed.  The need to convert to open surgery, which requires a larger incision in the abdomen. This may be necessary if your surgeon thinks that it is not safe to continue with a laparoscopic procedure. BEFORE THE PROCEDURE  Ask your health care provider about:  Changing or stopping your regular medicines. This is especially important if you are taking diabetes medicines or blood thinners.  Taking medicines such as aspirin and ibuprofen. These medicines can thin your blood. Do not take these medicines before your procedure if your health care provider instructs you not to.  Follow instructions from your health care provider about eating or drinking restrictions.  Let your health care provider know if you develop a cold or an infection before surgery.  Plan to have someone take you home after the procedure.  Ask your health care provider how your surgical site will be marked or identified.  You may be given antibiotic medicine to help prevent infection. PROCEDURE  To reduce your risk of infection:  Your health care team will wash or sanitize their hands.  Your skin will be washed with soap.  An IV tube may be inserted into one of your veins.  You will be given a medicine to  make you fall asleep (general anesthetic).  A breathing tube will be placed in your mouth.  The surgeon will make several small cuts (incisions) in your abdomen.  A thin, lighted tube (laparoscope) that has a tiny camera on the end will be inserted through one of the small incisions. The camera on the laparoscope will send a  picture to a TV screen (monitor) in the operating room. This will give the surgeon a good view inside your abdomen.  A gas will be pumped into your abdomen. This will expand your abdomen to give the surgeon more room to perform the surgery.  Other tools that are needed for the procedure will be inserted through the other incisions. The gallbladder will be removed through one of the incisions.  After your gallbladder has been removed, the incisions will be closed with stitches (sutures), staples, or skin glue.  Your incisions may be covered with a bandage (dressing). The procedure may vary among health care providers and hospitals. AFTER THE PROCEDURE  Your blood pressure, heart rate, breathing rate, and blood oxygen level will be monitored often until the medicines you were given have worn off.  You will be given medicines as needed to control your pain.   This information is not intended to replace advice given to you by your health care provider. Make sure you discuss any questions you have with your health care provider.   Document Released: 07/04/2005 Document Revised: 03/25/2015 Document Reviewed: 02/13/2013 Elsevier Interactive Patient Education 2016 Elsevier Inc.   Low-Fat Diet for Gallbladder Conditions A low-fat diet can be helpful if you have pancreatitis or a gallbladder condition. With these conditions, your pancreas and gallbladder have trouble digesting fats. A healthy eating plan with less fat will help rest your pancreas and gallbladder and reduce your symptoms. WHAT DO I NEED TO KNOW ABOUT THIS DIET?  Eat a low-fat diet.  Reduce your fat intake to less than 20-30% of your total daily calories. This is less than 50-60 g of fat per day.  Remember that you need some fat in your diet. Ask your dietician what your daily goal should be.  Choose nonfat and low-fat healthy foods. Look for the words "nonfat," "low fat," or "fat free."  As a guide, look on the label and  choose foods with less than 3 g of fat per serving. Eat only one serving.  Avoid alcohol.  Do not smoke. If you need help quitting, talk with your health care provider.  Eat small frequent meals instead of three large heavy meals. WHAT FOODS CAN I EAT? Grains Include healthy grains and starches such as potatoes, wheat bread, fiber-rich cereal, and brown rice. Choose whole grain options whenever possible. In adults, whole grains should account for 45-65% of your daily calories.  Fruits and Vegetables Eat plenty of fruits and vegetables. Fresh fruits and vegetables add fiber to your diet. Meats and Other Protein Sources Eat lean meat such as chicken and pork. Trim any fat off of meat before cooking it. Eggs, fish, and beans are other sources of protein. In adults, these foods should account for 10-35% of your daily calories. Dairy Choose low-fat milk and dairy options. Dairy includes fat and protein, as well as calcium.  Fats and Oils Limit high-fat foods such as fried foods, sweets, baked goods, sugary drinks.  Other Creamy sauces and condiments, such as mayonnaise, can add extra fat. Think about whether or not you need to use them, or use smaller amounts or low fat  options. WHAT FOODS ARE NOT RECOMMENDED?  High fat foods, such as:  [employer].  Ice cream.  [place] toast.  Sweet rolls.  Pizza.  Cheese bread.  Foods covered with batter, butter, creamy sauces, or cheese.  Fried foods.  Sugary drinks and desserts.  Foods that cause gas or bloating   This information is not intended to replace advice given to you by your health care provider. Make sure you discuss any questions you have with your health care provider.   Document Released: 07/09/2013 Document Reviewed: 07/09/2013 Elsevier Interactive Patient Education [employer].

## 2017-08-07 NOTE — Progress Notes (Signed)
Surgical Clinic History and Physical  Referring provider:  Department, [facility] [address] [place], [place] [id number]  HISTORY OF PRESENT ILLNESS (HPI):  27 y.o. female (accompanied by her mother) presents for evaluation of RUQ abdominal pain. Patient reports she first noticed the pain 4 - 5 weeks ago, following the birth of her first daughter (c-section 5 weeks ago). Patient describes that her pain has been worse in the evenings, typically following foods containing more fat (meats, cheeses/dairy, and fried foods). She says the pain was relatively mild at first, but worsened, prompting her to seek evaluation at [facility] ED 6 days ago. Since having been advised to decrease consumption of fatty foods, she says her pain has resolved, but she expresses anxiety regarding return of her severe pain if she eats "the wrong food". Of note, patient says she is not breast feeding her daughter, and she otherwise denies N/V, fever/chills, CP, or SOB and denies any prior similar episodes/symptoms.  PAST MEDICAL HISTORY (PMH):  Past Medical History:  Diagnosis Date  . Medical history non-contributory      PAST SURGICAL HISTORY (PSH):  Past Surgical History:  Procedure Laterality Date  . CESAREAN SECTION N/A 06/29/2017   Procedure: CESAREAN SECTION;  Surgeon: Conard NovakJackson, Stephen D, MD;  Location: [facility] ORS;  Service: Obstetrics;  Laterality: N/A;     MEDICATIONS:  Prior to Admission medications   Not on File     ALLERGIES:  Allergies  Allergen Reactions  . Dicyclomine Itching  . Coconut Flavor      SOCIAL HISTORY:  Social History   Socioeconomic History  . Marital status: Single    Spouse name: Not on file  . Number of children: Not on file  . Years of education: Not on file  . Highest education level: Not on file  Social Needs  . Financial resource strain: Not on file  . Food insecurity - worry: Not on file  . Food insecurity - inability: Not on file  .  Transportation needs - medical: Not on file  . Transportation needs - non-medical: Not on file  Occupational History  . Not on file  Tobacco Use  . Smoking status: Passive Smoke Exposure - Never Smoker  . Smokeless tobacco: Never Used  Substance and Sexual Activity  . Alcohol use: No  . Drug use: No  . Sexual activity: Yes  Other Topics Concern  . Not on file  Social History Narrative  . Not on file    The patient currently resides (home / rehab facility / nursing home): Home The patient normally is (ambulatory / bedbound): Ambulatory  FAMILY HISTORY:  No family history on file.  Otherwise negative/non-contributory.  REVIEW OF SYSTEMS:  Constitutional: denies any other weight loss, fever, chills, or sweats  Eyes: denies any other vision changes, history of eye injury  ENT: denies sore throat, hearing problems  Respiratory: denies shortness of breath, wheezing  Cardiovascular: denies chest pain, palpitations  Gastrointestinal: abdominal pain, N/V, and bowel function as per HPI Musculoskeletal: denies any other joint pains or cramps  Skin: Denies any other rashes or skin discolorations except as per HPI Neurological: denies any other headache, dizziness, weakness  Psychiatric: Denies any other depression, anxiety   All other review of systems were otherwise negative   VITAL SIGNS:  BP 116/70   Pulse 80   Temp 97.7 F (36.5 C) (Oral)   Ht 5' (1.524 m)   Wt 175 lb (79.4 kg)   BMI 34.18  kg/m   PHYSICAL EXAM:  Constitutional:  -- Obese body habitus  -- Awake, alert, and oriented x3  Eyes:  -- Pupils equally round and reactive to light  -- No scleral icterus  Ear, nose, throat:  -- No jugular venous distension -- No nasal drainage, bleeding Pulmonary:  -- No crackles  -- Equal breath sounds bilaterally -- Breathing non-labored at rest Cardiovascular:  -- S1, S2 present  -- No pericardial rubs  Gastrointestinal:  -- Abdomen soft, nontender, non-distended, no  guarding/rebound -- No abdominal masses appreciated, pulsatile or otherwise; c-section site healing well Musculoskeletal and Integumentary:  -- Wounds or skin discoloration: None appreciated except as above (GI) -- Extremities: B/L UE and LE FROM, hands and feet warm Neurologic:  -- Motor function: Intact and symmetric -- Sensation: Intact and symmetric  Labs:  CBC Latest Ref Rng & Units 08/01/2017 07/02/2017 07/02/2017  WBC 3.6 - 11.0 K/uL 8.5 13.2(H) 13.6(H)  Hemoglobin 12.0 - 16.0 g/dL 10.9(L) 8.2(L) 6.9(L)  Hematocrit 35.0 - 47.0 % 35.1 25.3(L) 22.0(L)  Platelets 150 - 440 K/uL 191 158 147(L)   CMP Latest Ref Rng & Units 08/01/2017 04/28/2012  Glucose 65 - 99 mg/dL 161(W) 82  BUN 6 - 20 mg/dL 11 8  Creatinine 9.60 - 1.00 mg/dL 4.54 0.98  Sodium 119 - 145 mmol/L 140 142  Potassium 3.5 - 5.1 mmol/L 3.6 4.1  Chloride 101 - 111 mmol/L 106 106  CO2 22 - 32 mmol/L 25 29  Calcium 8.9 - 10.3 mg/dL 1.4(N) 9.6  Total Protein 6.5 - 8.1 g/dL 7.4 -  Total Bilirubin 0.3 - 1.2 mg/dL 0.8 -  Alkaline Phos 38 - 126 U/L 132(H) -  AST 15 - 41 U/L 40 -  ALT 14 - 54 U/L 20 -    Imaging studies:  Limited RUQ Abdominal Ultrasound (08/01/2017) Multiple small mobile gallstones, largest measuring 6 mm. No gallbladder wall thickening or pericholecystic fluid. No sonographic Murphy's sign elicited during the exam. Common bile duct diameter measures 3 mm (within normal limits).  Assessment/Plan:  27 y.o. female with post-partum post-prandial RUQ abdominal pain, consistent with symptomatic cholelithiasis, complicated by co-morbidities including obesity (BMI >34).   - avoid/minimize foods with more fat (meats, cheeses/dairy, fried foods)  - prefer lower-fat foods such as low-fat vegetables, whole grains, and fruit   - all risks, benefits, and alternatives to cholecystectomy were discussed with the patient and her mom, all of their questions were answered to their expressed satisfaction, patient  expresses she wishes to proceed, and informed consent was obtained.  - will plan for laparoscopic cholecystectomy on Tuesday, 2/5 per patient and her mom's request  - anticipate return to clinic 2 weeks after above procedure  - instructed to call if any questions or concerns  All of the above recommendations were discussed with the patient and patient's mom, and all of patient's and family's questions were answered to their expressed satisfaction.  Thank you for the opportunity to participate in this patient's care.  -- Scherrie Gerlach Earlene Plater, MD, RPVI [place]: [facility] General Surgery - Partnering for exceptional care. Office: [phone]

## 2017-08-10 ENCOUNTER — Telehealth: Payer: Self-pay | Admitting: Surgery

## 2017-08-10 NOTE — Telephone Encounter (Signed)
Pt advised of pre op date/time and sx date. Sx: 08/22/17 with Dr Davis--laparoscopic cholecystectomy.  Pre op: 08/15/17 between 9-1:00pm--phone interview.   Patient made aware to call [phone], between 1-3:00pm the day before surgery, to find out what time to arrive.

## 2017-08-11 ENCOUNTER — Encounter: Payer: Self-pay | Admitting: Obstetrics and Gynecology

## 2017-08-11 ENCOUNTER — Ambulatory Visit (INDEPENDENT_AMBULATORY_CARE_PROVIDER_SITE_OTHER): Payer: Medicaid Other | Admitting: Obstetrics and Gynecology

## 2017-08-11 DIAGNOSIS — Z30016 Encounter for initial prescription of transdermal patch hormonal contraceptive device: Secondary | ICD-10-CM

## 2017-08-11 MED ORDER — NORELGESTROMIN-ETH ESTRADIOL 150-35 MCG/24HR TD PTWK: 1.0000 | MEDICATED_PATCH | TRANSDERMAL | 12 refills | Status: DC

## 2017-08-11 NOTE — Progress Notes (Signed)
Postpartum Visit   Chief Complaint  Patient presents with  . Postpartum Care    C/S on 07/01/2017    History of Present Illness: Patient is a 27 y.o. G1P1001 presents for postpartum visit.  Date of delivery: 07/01/2017 Type of delivery: cesarean section Pregnancy or labor problems:  no Any problems since the delivery:  Right upper quadrant pain. Likely cholecystitis. Patient received one unit pRBCs in the postop period due to hemoglobin of 6.9.    Newborn Details:  SINGLETON :  1. Baby's name: Danne [place]. Birth weight: 7lb 14 oz (3,570g) Maternal Details:  Breast Feeding:  no Post partum depression/anxiety noted:  no Edinburgh Post-Partum Depression Score:  1  Date of last PAP: uncertain   Past Medical History:  Diagnosis Date  . Medical history non-contributory     Past Surgical History:  Procedure Laterality Date  . CESAREAN SECTION N/A 06/29/2017   Procedure: CESAREAN SECTION;  Surgeon: Conard Novak, MD;  Location: [facility] ORS;  Service: Obstetrics;  Laterality: N/A;    Medications   None    Allergies  Allergen Reactions  . Dicyclomine Itching  . Coconut Flavor Swelling    Throat swells and closes.     Social History   Socioeconomic History  . Marital status: Single    Spouse name: Not on file  . Number of children: Not on file  . Years of education: Not on file  . Highest education level: Not on file  Social Needs  . Financial resource strain: Not on file  . Food insecurity - worry: Not on file  . Food insecurity - inability: Not on file  . Transportation needs - medical: Not on file  . Transportation needs - non-medical: Not on file  Occupational History  . Not on file  Tobacco Use  . Smoking status: Passive Smoke Exposure - Never Smoker  . Smokeless tobacco: Never Used  Substance and Sexual Activity  . Alcohol use: No  . Drug use: No  . Sexual activity: Yes  Other Topics Concern  . Not on file  Social History Narrative  . Not on file    Family history: denies history of gyn cancer  Review of Systems  Constitutional: Negative.   HENT: Negative.   Eyes: Negative.   Respiratory: Negative.   Cardiovascular: Negative.   Gastrointestinal: Negative.   Genitourinary: Negative.   Musculoskeletal: Negative.   Skin: Negative.   Neurological: Negative.   Psychiatric/Behavioral: Negative.      Physical Exam BP (!) 104/58 (BP Location: Left Arm, Patient Position: Sitting, Cuff Size: Normal)   Pulse 82   Ht 5' (1.524 m)   Wt 172 lb (78 kg)   LMP 08/07/2017 (Exact Date)   Breastfeeding? No   BMI 33.59 kg/m   Physical Exam  Constitutional: She is oriented to person, place, and time. She appears well-developed and well-nourished. No distress.  Eyes: EOM are normal. No scleral icterus.  Neck: Normal range of motion. Neck supple.  Cardiovascular: Normal rate and regular rhythm.  Pulmonary/Chest: Effort normal and breath sounds normal. No respiratory distress. She has no wheezes. She has no rales.  Abdominal: Soft. Bowel sounds are normal. She exhibits no distension and no mass. There is no tenderness. There is no rebound and no guarding.  Incision: clean, dry, intact without erythema, induration, warmth, and tenderness  Musculoskeletal: Normal range of motion. She exhibits no edema.  Neurological: She is alert and oriented to person, place, and time. No cranial nerve deficit.  Skin: Skin  is warm and dry. No erythema.  Psychiatric: She has a normal mood and affect. Her behavior is normal. Judgment normal.     Female Chaperone present during breast and/or pelvic exam.  Assessment: 27 y.o. G1P1001 presenting for 6 week postpartum visit  Plan: Problem List Items Addressed This Visit      Other   Postpartum care following cesarean delivery - Primary    Other Visit Diagnoses    Encounter for initial prescription of transdermal patch hormonal contraceptive device       Relevant Medications   norelgestromin-ethinyl  estradiol (ORTHO EVRA) 150-35 MCG/24HR transdermal patch     1) Contraception Education given regarding options for contraception, including all the various methods with the attendant risks of each method. She elects to use the patch. Samples provided and instructions given on how to use.  Rx provided, as well.Marland Kitchen.  2)  Pap - ASCCP guidelines and rational discussed.  Patient opts for routine screening interval. Unable to receive today due to menstrual bleeding.  3) Patient underwent screening for postpartum depression with no concerns noted.  4) Follow up 1 year for routine annual exam  Thomasene MohairStephen Noheli Melder, MD 08/11/2017 1:57 PM

## 2017-08-15 ENCOUNTER — Other Ambulatory Visit: Payer: Self-pay

## 2017-08-15 ENCOUNTER — Encounter
Admission: RE | Admit: 2017-08-15 | Discharge: 2017-08-15 | Disposition: A | Discharge status: Home / Self Care | Payer: Medicaid Other | Source: Ambulatory Visit | Attending: Surgery | Admitting: Surgery

## 2017-08-15 HISTORY — DX: Personal history of Methicillin resistant Staphylococcus aureus infection: Z86.14

## 2017-08-15 NOTE — Patient Instructions (Signed)
  Your procedure is scheduled on: 08-22-17 TUESDAY Report to Same Day Surgery 2nd floor medical mall (Medical Mall Entrance-take elevator on left to 2nd floor.  Check in with surgery information desk.) To find out your arrival time please call [phone] between 1PM - 3PM on 08-21-17 MONDAY  Remember: Instructions that are not followed completely may result in serious medical risk, up to and including death, or upon the discretion of your surgeon and anesthesiologist your surgery may need to be rescheduled.    _x___ 1. Do not eat food after midnight the night before your procedure. NO GUM OR CANDY AFTER MIDNIGHT.  You may drink clear liquids up to 2 hours before you are scheduled to arrive at the hospital for your procedure.  Do not drink clear liquids within 2 hours of your scheduled arrival to the hospital.  Clear liquids include  --Water or Apple juice without pulp  --Clear carbohydrate beverage such as ClearFast or Gatorade  --Black Coffee or Clear Tea (No milk, no creamers, do not add anything to the coffee or Tea      __x__ 2. No Alcohol for 24 hours before or after surgery.   __x__3. No Smoking for 24 prior to surgery.   ____  4. Bring all medications with you on the day of surgery if instructed.    __x__ 5. Notify your doctor if there is any change in your medical condition     (cold, fever, infections).     Do not wear jewelry, make-up, hairpins, clips or nail polish.  Do not wear lotions, powders, or perfumes. You may wear deodorant.  Do not shave 48 hours prior to surgery. Men may shave face and neck.  Do not bring valuables to the hospital.    [facility] is not responsible for any belongings or valuables.               Contacts, dentures or bridgework may not be worn into surgery.  Leave your suitcase in the car. After surgery it may be brought to your room.  For patients admitted to the hospital, discharge time is determined by your treatment team.   Patients  discharged the day of surgery will not be allowed to drive home.  You will need someone to drive you home and stay with you the night of your procedure.    Please read over the following fact sheets that you were given:   [place] and or MRSA Information   ____ Take anti-hypertensive listed below, cardiac, seizure, asthma,anti-reflux and psychiatric medicines. These include:  1. NONE  2.  3.  4.  5.  6.  ____Fleets enema or Magnesium Citrate as directed.   _x___ Use CHG Soap or sage wipes as directed on instruction sheet   ____ Use inhalers on the day of surgery and bring to hospital day of surgery  ____ Stop Metformin and Janumet 2 days prior to surgery.    ____ Take 1/2 of usual insulin dose the night before surgery and none on the morning surgery.   ____ Follow recommendations from Cardiologist, Pulmonologist or PCP regarding stopping Aspirin, Coumadin, Plavix ,Eliquis, Effient, or Pradaxa, and Pletal.  X____Stop Anti-inflammatories such as Advil, Aleve, Ibuprofen, Motrin, Naproxen, Naprosyn, Goodies powders or aspirin products NOW-OK to take Tylenol    ____ Stop supplements until after surgery.     ____ Bring C-Pap to the hospital.    

## 2017-08-17 ENCOUNTER — Inpatient Hospital Stay: Admission: RE | Admit: 2017-08-17 | Payer: Medicaid Other | Source: Ambulatory Visit

## 2017-08-18 ENCOUNTER — Inpatient Hospital Stay: Admission: RE | Admit: 2017-08-18 | Payer: Medicaid Other | Source: Ambulatory Visit

## 2017-08-21 ENCOUNTER — Inpatient Hospital Stay: Admission: RE | Admit: 2017-08-21 | Payer: Medicaid Other | Source: Ambulatory Visit

## 2017-08-21 MED ORDER — CEFAZOLIN SODIUM-DEXTROSE 2-4 GM/100ML-% IV SOLN: 2.0000 g | INTRAVENOUS | Status: DC

## 2017-08-22 ENCOUNTER — Encounter: Payer: Self-pay | Admitting: *Deleted

## 2017-08-22 ENCOUNTER — Ambulatory Visit: Payer: Medicaid Other | Admitting: Anesthesiology

## 2017-08-22 ENCOUNTER — Other Ambulatory Visit: Payer: Self-pay

## 2017-08-22 ENCOUNTER — Encounter
Admission: RE | Disposition: A | Discharge status: Home / Self Care | Payer: Self-pay | Source: Ambulatory Visit | Attending: Surgery

## 2017-08-22 ENCOUNTER — Ambulatory Visit
Admission: RE | Admit: 2017-08-22 | Discharge: 2017-08-22 | Disposition: A | Discharge status: Home / Self Care | Payer: Medicaid Other | Source: Ambulatory Visit | Attending: Surgery | Admitting: Surgery

## 2017-08-22 DIAGNOSIS — Z888 Allergy status to other drugs, medicaments and biological substances status: Secondary | ICD-10-CM | POA: Insufficient documentation

## 2017-08-22 DIAGNOSIS — Z91018 Allergy to other foods: Secondary | ICD-10-CM | POA: Diagnosis not present

## 2017-08-22 DIAGNOSIS — O9963 Diseases of the digestive system complicating the puerperium: Secondary | ICD-10-CM | POA: Insufficient documentation

## 2017-08-22 DIAGNOSIS — K8010 Calculus of gallbladder with chronic cholecystitis without obstruction: Secondary | ICD-10-CM | POA: Diagnosis not present

## 2017-08-22 DIAGNOSIS — K8020 Calculus of gallbladder without cholecystitis without obstruction: Secondary | ICD-10-CM

## 2017-08-22 DIAGNOSIS — Z7722 Contact with and (suspected) exposure to environmental tobacco smoke (acute) (chronic): Secondary | ICD-10-CM | POA: Diagnosis not present

## 2017-08-22 DIAGNOSIS — O9989 Other specified diseases and conditions complicating pregnancy, childbirth and the puerperium: Secondary | ICD-10-CM | POA: Insufficient documentation

## 2017-08-22 HISTORY — DX: Nausea with vomiting, unspecified: R11.2

## 2017-08-22 HISTORY — DX: Other specified postprocedural states: Z98.890

## 2017-08-22 HISTORY — PX: CHOLECYSTECTOMY: SHX55

## 2017-08-22 LAB — SURGICAL PCR SCREEN
MRSA, PCR: NEGATIVE
STAPHYLOCOCCUS AUREUS: NEGATIVE

## 2017-08-22 LAB — POCT PREGNANCY, URINE: Preg Test, Ur: NEGATIVE

## 2017-08-22 SURGERY — LAPAROSCOPIC CHOLECYSTECTOMY
Anesthesia: General | Site: Abdomen | Wound class: Clean Contaminated

## 2017-08-22 MED ORDER — PROPOFOL 10 MG/ML IV BOLUS
INTRAVENOUS | Status: DC | PRN
  Administered 2017-08-22: 120 mg via INTRAVENOUS

## 2017-08-22 MED ORDER — SUGAMMADEX SODIUM 200 MG/2ML IV SOLN
INTRAVENOUS | Status: DC | PRN
  Administered 2017-08-22: 200 mg via INTRAVENOUS

## 2017-08-22 MED ORDER — ONDANSETRON HCL 4 MG/2ML IJ SOLN
4.0000 mg | Freq: Once | INTRAMUSCULAR | Status: AC | PRN
  Administered 2017-08-22: 4 mg via INTRAVENOUS

## 2017-08-22 MED ORDER — LIDOCAINE HCL (PF) 2 % IJ SOLN
INTRAMUSCULAR | Status: AC
  Filled 2017-08-22: qty 10

## 2017-08-22 MED ORDER — MIDAZOLAM HCL 2 MG/2ML IJ SOLN
INTRAMUSCULAR | Status: AC
  Filled 2017-08-22: qty 2

## 2017-08-22 MED ORDER — LIDOCAINE HCL (CARDIAC) 20 MG/ML IV SOLN
INTRAVENOUS | Status: DC | PRN
  Administered 2017-08-22: 40 mg via INTRAVENOUS

## 2017-08-22 MED ORDER — ACETAMINOPHEN 10 MG/ML IV SOLN
INTRAVENOUS | Status: AC
  Filled 2017-08-22: qty 100

## 2017-08-22 MED ORDER — SEVOFLURANE IN SOLN
RESPIRATORY_TRACT | Status: AC
  Filled 2017-08-22: qty 250

## 2017-08-22 MED ORDER — ACETAMINOPHEN 10 MG/ML IV SOLN
INTRAVENOUS | Status: DC | PRN
  Administered 2017-08-22: 1000 mg via INTRAVENOUS

## 2017-08-22 MED ORDER — FAMOTIDINE 20 MG PO TABS
20.0000 mg | ORAL_TABLET | Freq: Once | ORAL | Status: AC
  Administered 2017-08-22: 20 mg via ORAL

## 2017-08-22 MED ORDER — LIDOCAINE HCL 1 % IJ SOLN
INTRAMUSCULAR | Status: DC | PRN
  Administered 2017-08-22: 20 mL via INTRAMUSCULAR

## 2017-08-22 MED ORDER — DEXAMETHASONE SODIUM PHOSPHATE 10 MG/ML IJ SOLN
INTRAMUSCULAR | Status: AC
  Filled 2017-08-22: qty 1

## 2017-08-22 MED ORDER — PROPOFOL 10 MG/ML IV BOLUS
INTRAVENOUS | Status: AC
  Filled 2017-08-22: qty 20

## 2017-08-22 MED ORDER — FENTANYL CITRATE (PF) 100 MCG/2ML IJ SOLN
INTRAMUSCULAR | Status: AC
  Filled 2017-08-22: qty 2

## 2017-08-22 MED ORDER — SUCCINYLCHOLINE CHLORIDE 20 MG/ML IJ SOLN
INTRAMUSCULAR | Status: DC | PRN
  Administered 2017-08-22: 100 mg via INTRAVENOUS

## 2017-08-22 MED ORDER — KETOROLAC TROMETHAMINE 30 MG/ML IJ SOLN
INTRAMUSCULAR | Status: DC | PRN
  Administered 2017-08-22: 30 mg via INTRAVENOUS

## 2017-08-22 MED ORDER — SODIUM CHLORIDE FLUSH 0.9 % IV SOLN
INTRAVENOUS | Status: AC
  Filled 2017-08-22: qty 10

## 2017-08-22 MED ORDER — CHLORHEXIDINE GLUCONATE CLOTH 2 % EX PADS
6.0000 | MEDICATED_PAD | Freq: Once | CUTANEOUS | Status: AC
  Administered 2017-08-22: 6 via TOPICAL

## 2017-08-22 MED ORDER — ONDANSETRON HCL 4 MG/2ML IJ SOLN
INTRAMUSCULAR | Status: DC | PRN
  Administered 2017-08-22: 4 mg via INTRAVENOUS

## 2017-08-22 MED ORDER — PROMETHAZINE HCL 25 MG/ML IJ SOLN
6.2500 mg | Freq: Once | INTRAMUSCULAR | Status: AC
  Administered 2017-08-22: 6.25 mg via INTRAVENOUS

## 2017-08-22 MED ORDER — FENTANYL CITRATE (PF) 100 MCG/2ML IJ SOLN: 25.0000 ug | INTRAMUSCULAR | Status: DC | PRN

## 2017-08-22 MED ORDER — ONDANSETRON HCL 4 MG/2ML IJ SOLN
INTRAMUSCULAR | Status: AC
  Administered 2017-08-22: 4 mg via INTRAVENOUS
  Filled 2017-08-22: qty 2

## 2017-08-22 MED ORDER — OXYCODONE-ACETAMINOPHEN 5-325 MG PO TABS
ORAL_TABLET | ORAL | Status: AC
  Filled 2017-08-22: qty 1

## 2017-08-22 MED ORDER — LIDOCAINE HCL (PF) 1 % IJ SOLN
INTRAMUSCULAR | Status: AC
  Filled 2017-08-22: qty 30

## 2017-08-22 MED ORDER — FENTANYL CITRATE (PF) 100 MCG/2ML IJ SOLN
INTRAMUSCULAR | Status: DC | PRN
  Administered 2017-08-22 (×4): 50 ug via INTRAVENOUS

## 2017-08-22 MED ORDER — FAMOTIDINE 20 MG PO TABS
ORAL_TABLET | ORAL | Status: AC
  Filled 2017-08-22: qty 1

## 2017-08-22 MED ORDER — LACTATED RINGERS IV SOLN
INTRAVENOUS | Status: DC
  Administered 2017-08-22: 09:00:00 via INTRAVENOUS

## 2017-08-22 MED ORDER — ONDANSETRON HCL 4 MG/2ML IJ SOLN
INTRAMUSCULAR | Status: AC
  Filled 2017-08-22: qty 2

## 2017-08-22 MED ORDER — ROCURONIUM BROMIDE 100 MG/10ML IV SOLN
INTRAVENOUS | Status: DC | PRN
  Administered 2017-08-22: 10 mg via INTRAVENOUS
  Administered 2017-08-22: 30 mg via INTRAVENOUS

## 2017-08-22 MED ORDER — PROMETHAZINE HCL 25 MG/ML IJ SOLN
INTRAMUSCULAR | Status: AC
  Administered 2017-08-22: 6.25 mg via INTRAVENOUS
  Filled 2017-08-22: qty 1

## 2017-08-22 MED ORDER — MIDAZOLAM HCL 2 MG/2ML IJ SOLN
INTRAMUSCULAR | Status: DC | PRN
  Administered 2017-08-22: 2 mg via INTRAVENOUS

## 2017-08-22 MED ORDER — ROCURONIUM BROMIDE 50 MG/5ML IV SOLN
INTRAVENOUS | Status: AC
  Filled 2017-08-22: qty 1

## 2017-08-22 MED ORDER — DEXAMETHASONE SODIUM PHOSPHATE 10 MG/ML IJ SOLN
INTRAMUSCULAR | Status: DC | PRN
  Administered 2017-08-22: 10 mg via INTRAVENOUS

## 2017-08-22 MED ORDER — CHLORHEXIDINE GLUCONATE CLOTH 2 % EX PADS: 6.0000 | MEDICATED_PAD | Freq: Once | CUTANEOUS | Status: DC

## 2017-08-22 MED ORDER — OXYCODONE-ACETAMINOPHEN 5-325 MG PO TABS
1.0000 | ORAL_TABLET | ORAL | Status: DC | PRN
  Administered 2017-08-22: 1 via ORAL

## 2017-08-22 MED ORDER — BUPIVACAINE HCL (PF) 0.5 % IJ SOLN
INTRAMUSCULAR | Status: AC
  Filled 2017-08-22: qty 30

## 2017-08-22 MED ORDER — OXYCODONE-ACETAMINOPHEN 5-325 MG PO TABS: 1.0000 | ORAL_TABLET | ORAL | 0 refills | Status: DC | PRN

## 2017-08-22 MED ORDER — SUCCINYLCHOLINE CHLORIDE 20 MG/ML IJ SOLN
INTRAMUSCULAR | Status: AC
  Filled 2017-08-22: qty 1

## 2017-08-22 MED ORDER — CEFAZOLIN SODIUM-DEXTROSE 2-4 GM/100ML-% IV SOLN
INTRAVENOUS | Status: AC
  Filled 2017-08-22: qty 100

## 2017-08-22 MED ORDER — LACTATED RINGERS IV SOLN
INTRAVENOUS | Status: DC | PRN
  Administered 2017-08-22: 09:00:00 via INTRAVENOUS

## 2017-08-22 SURGICAL SUPPLY — 38 items
APPLIER CLIP ROT 10 11.4 M/L (STAPLE) ×3
CHLORAPREP W/TINT 26ML (MISCELLANEOUS) ×3 IMPLANT
CLIP APPLIE ROT 10 11.4 M/L (STAPLE) ×1 IMPLANT
DECANTER SPIKE VIAL GLASS SM (MISCELLANEOUS) IMPLANT
DERMABOND ADVANCED (GAUZE/BANDAGES/DRESSINGS) ×2
DERMABOND ADVANCED .7 DNX12 (GAUZE/BANDAGES/DRESSINGS) ×1 IMPLANT
DEVICE PMI PUNCTURE CLOSURE (MISCELLANEOUS) ×3 IMPLANT
DRESSING SURGICEL FIBRLLR 1X2 (HEMOSTASIS) IMPLANT
DRSG SURGICEL FIBRILLAR 1X2 (HEMOSTASIS)
ELECT REM PT RETURN 9FT ADLT (ELECTROSURGICAL) ×3
ELECTRODE REM PT RTRN 9FT ADLT (ELECTROSURGICAL) ×1 IMPLANT
GLOVE BIO SURGEON STRL SZ7 (GLOVE) ×3 IMPLANT
GLOVE BIOGEL PI IND STRL 7.5 (GLOVE) ×1 IMPLANT
GLOVE BIOGEL PI INDICATOR 7.5 (GLOVE) ×2
GOWN STRL REUS W/ TWL LRG LVL3 (GOWN DISPOSABLE) ×3 IMPLANT
GOWN STRL REUS W/TWL LRG LVL3 (GOWN DISPOSABLE) ×6
IRRIGATION STRYKERFLOW (MISCELLANEOUS) IMPLANT
IRRIGATOR STRYKERFLOW (MISCELLANEOUS)
IV NS 1000ML (IV SOLUTION)
IV NS 1000ML BAXH (IV SOLUTION) IMPLANT
IV SODIUM CHL 0.9% 500ML (IV SOLUTION) ×3 IMPLANT
KIT TURNOVER KIT A (KITS) ×3 IMPLANT
L-HOOK LAP DISP 36CM (ELECTROSURGICAL) ×3
LHOOK LAP DISP 36CM (ELECTROSURGICAL) ×1 IMPLANT
NEEDLE HYPO 22GX1.5 SAFETY (NEEDLE) ×3 IMPLANT
NEEDLE INSUFFLATION 14GA 120MM (NEEDLE) ×3 IMPLANT
NS IRRIG 1000ML POUR BTL (IV SOLUTION) IMPLANT
PACK LAP CHOLECYSTECTOMY (MISCELLANEOUS) ×3 IMPLANT
PENCIL ELECTRO HAND CTR (MISCELLANEOUS) ×3 IMPLANT
POUCH SPECIMEN RETRIEVAL 10MM (ENDOMECHANICALS) ×3 IMPLANT
SCISSORS METZENBAUM CVD 33 (INSTRUMENTS) IMPLANT
SLEEVE ENDOPATH XCEL 5M (ENDOMECHANICALS) ×6 IMPLANT
SUT MNCRL AB 4-0 PS2 18 (SUTURE) ×3 IMPLANT
SUT VICRYL 0 UR6 27IN ABS (SUTURE) ×3 IMPLANT
SUT VICRYL AB 3-0 FS1 BRD 27IN (SUTURE) ×3 IMPLANT
TROCAR XCEL NON-BLD 11X100MML (ENDOMECHANICALS) ×3 IMPLANT
TROCAR XCEL NON-BLD 5MMX100MML (ENDOMECHANICALS) ×3 IMPLANT
TUBING INSUFFLATION (TUBING) ×3 IMPLANT

## 2017-08-22 NOTE — Transfer of Care (Signed)
Immediate Anesthesia Transfer of Care Note  Patient: Rebecca Walton  Procedure(s) Performed: LAPAROSCOPIC CHOLECYSTECTOMY (N/A Abdomen)  Patient Location: PACU  Anesthesia Type:General  Level of Consciousness: awake and sedated  Airway & Oxygen Therapy: Patient Spontanous Breathing and Patient connected to face mask oxygen  Post-op Assessment: Report given to RN and Post -op Vital signs reviewed and stable  Post vital signs: Reviewed and stable  Last Vitals:  Vitals:   08/22/17 0828 08/22/17 1040  BP: 120/64 124/75  Pulse: 89 92  Resp: 14 14  Temp: 36.4 C 37 C  SpO2: 99% 100%    Last Pain:  Vitals:   08/22/17 0828  TempSrc: Oral  PainSc: 0-No pain         Complications: No apparent anesthesia complications

## 2017-08-22 NOTE — Anesthesia Post-op Follow-up Note (Signed)
Anesthesia QCDR form completed.        

## 2017-08-22 NOTE — Interval H&P Note (Signed)
History and Physical Interval Note:  08/22/2017 8:55 AM  Rebecca Walton  has presented today for surgery, with the diagnosis of CHOLELITHIASIS  The various methods of treatment have been discussed with the patient and family. After consideration of risks, benefits and other options for treatment, the patient has consented to  Procedure(s): LAPAROSCOPIC CHOLECYSTECTOMY (N/A) as a surgical intervention .  The patient's history has been reviewed, patient examined, no change in status, stable for surgery.  I have reviewed the patient's chart and labs.  Questions were answered to the patient's satisfaction.     Ancil LinseyJason Evan Paquita Printy

## 2017-08-22 NOTE — OR Nursing (Signed)
Discharge instruction discussed with pt and mom. Both voice understanding.

## 2017-08-22 NOTE — Discharge Instructions (Addendum)
In addition to included general post-operative instructions for Laparoscopic Cholecystectomy,  Diet: Resume home heart healthy diet (as discussed).   Activity: No heavy lifting >20 pounds (children, pets, laundry, garbage) or strenuous activity until follow-up, but light activity and walking are encouraged. Do not drive or drink alcohol if taking narcotic pain medications.  Wound care: 2 days after surgery (Thursday, 2/7), may shower/get incision wet with soapy water and pat dry (do not rub incisions), but no baths or submerging incision underwater until follow-up.   Medications: Resume all home medications. For mild to moderate pain: acetaminophen (Tylenol) or ibuprofen/naproxen (if no kidney disease). Combining Tylenol with alcohol can substantially increase your risk of causing liver disease. Narcotic pain medications, if prescribed, can be used for severe pain, though may cause nausea, constipation, and drowsiness. Do not combine Tylenol and Percocet (or similar) within a 6 hour period as Percocet (and similar) contain(s) Tylenol. If you do not need the narcotic pain medication, you do not need to fill the prescription.  Call office [phone]) at any time if any questions, worsening pain, fevers/chills, bleeding, drainage from incision site, or other concerns.  AMBULATORY SURGERY  DISCHARGE INSTRUCTIONS   1) The drugs that you were given will stay in your system until tomorrow so for the next 24 hours you should not:  A) Drive an automobile B) Make any legal decisions C) Drink any alcoholic beverage   2) You may resume regular meals tomorrow.  Today it is better to start with liquids and gradually work up to solid foods.  You may eat anything you prefer, but it is better to start with liquids, then soup and crackers, and gradually work up to solid foods.   3) Please notify your doctor immediately if you have any unusual bleeding, trouble breathing, redness and pain at the surgery  site, drainage, fever, or pain not relieved by medication.    4) Additional Instructions:        Please contact your physician with any problems or Same Day Surgery at [phone], Monday through Friday 6 am to 4 pm, or [place] at [facility] Main number at [phone].

## 2017-08-22 NOTE — Anesthesia Preprocedure Evaluation (Signed)
Anesthesia Evaluation  Patient identified by MRN, date of birth, ID band Patient awake    Reviewed: Allergy & Precautions, NPO status   History of Anesthesia Complications (+) PONV and history of anesthetic complications  Airway Mallampati: II  TM Distance: <3 FB     Dental  (+) Caps, Chipped   Pulmonary former smoker,    Pulmonary exam normal        Cardiovascular negative cardio ROS Normal cardiovascular exam     Neuro/Psych negative neurological ROS  negative psych ROS   GI/Hepatic Neg liver ROS,   Endo/Other  negative endocrine ROS  Renal/GU negative Renal ROS  negative genitourinary   Musculoskeletal negative musculoskeletal ROS (+)   Abdominal Normal abdominal exam  (+)   Peds negative pediatric ROS (+)  Hematology  (+) anemia ,   Anesthesia Other Findings   Reproductive/Obstetrics                             Anesthesia Physical Anesthesia Plan  ASA: II  Anesthesia Plan:    Post-op Pain Management:    Induction: Intravenous  PONV Risk Score and Plan:   Airway Management Planned: Oral ETT  Additional Equipment:   Intra-op Plan:   Post-operative Plan: Extubation in OR  Informed Consent: I have reviewed the patients History and Physical, chart, labs and discussed the procedure including the risks, benefits and alternatives for the proposed anesthesia with the patient or authorized representative who has indicated his/her understanding and acceptance.   Dental advisory given  Plan Discussed with: CRNA and Surgeon  Anesthesia Plan Comments:         Anesthesia Quick Evaluation

## 2017-08-22 NOTE — Anesthesia Postprocedure Evaluation (Signed)
Anesthesia Post Note  Patient: Rebecca Walton  Procedure(s) Performed: LAPAROSCOPIC CHOLECYSTECTOMY (N/A Abdomen)  Patient location during evaluation: PACU Anesthesia Type: General Level of consciousness: awake and alert and oriented Pain management: pain level controlled Vital Signs Assessment: post-procedure vital signs reviewed and stable Respiratory status: spontaneous breathing Cardiovascular status: blood pressure returned to baseline Anesthetic complications: no     Last Vitals:  Vitals:   08/22/17 1150 08/22/17 1215  BP: (!) 114/57 119/65  Pulse: 79 75  Resp: 16   Temp: (!) 36.1 C   SpO2: 100% 100%    Last Pain:  Vitals:   08/22/17 1215  TempSrc:   PainSc: 3                  Steven Veazie

## 2017-08-22 NOTE — Anesthesia Procedure Notes (Signed)
Procedure Name: Intubation Date/Time: 08/22/2017 9:20 AM Performed by: Allean Found, CRNA Pre-anesthesia Checklist: Patient identified, Emergency Drugs available, Suction available, Patient being monitored and Timeout performed Patient Re-evaluated:Patient Re-evaluated prior to induction Oxygen Delivery Method: Circle system utilized Preoxygenation: Pre-oxygenation with 100% oxygen Induction Type: IV induction Ventilation: Mask ventilation without difficulty Laryngoscope Size: Mac and 3 Grade View: Grade I Number of attempts: 1 Airway Equipment and Method: Stylet Placement Confirmation: ETT inserted through vocal cords under direct vision,  positive ETCO2 and breath sounds checked- equal and bilateral Secured at: 21 cm Tube secured with: Tape Dental Injury: Teeth and Oropharynx as per pre-operative assessment

## 2017-08-22 NOTE — Op Note (Signed)
SURGICAL OPERATIVE REPORT   DATE OF PROCEDURE: 08/22/2017  ATTENDING Surgeon(s): Ancil Linseyavis, Yolander Goodie Evan, MD  ANESTHESIA: GETA  PRE-OPERATIVE DIAGNOSIS: Symptomatic Cholelithiasis (K80.20)  POST-OPERATIVE DIAGNOSIS: Symptomatic Cholelithiasis (K80.20)  PROCEDURE(S): (cpt's: 47562) 1.) Laparoscopic Cholecystectomy  INTRAOPERATIVE FINDINGS: Mild pericholecystic inflammation with moderate gallbladder wall edema  INTRAOPERATIVE FLUIDS: 600 mL crystalloid   ESTIMATED BLOOD LOSS: Minimal (<30 mL)   URINE OUTPUT: No foley  SPECIMENS: Gallbladder  IMPLANTS: None  DRAINS: None   COMPLICATIONS: None apparent   CONDITION AT COMPLETION: Hemodynamically stable and extubated  DISPOSITION: PACU   INDICATION(S) FOR PROCEDURE:  Patient is a 27 y.o. female who recently presented with post-partum post-prandial RUQ > epigastric abdominal pain after eating fatty foods in particular. Ultrasound suggested cholelithiasis without sonographic evidence of cholecystitis. Upon decreasing consumption of fatty foods, she says her pain improved, but she expressed anxiety regarding pain if she eats "the wrong food". Accordingly, all risks, benefits, and alternatives to above elective procedures were discussed with the patient, who elected to proceed, and informed consent was obtained at that time.   DETAILS OF PROCEDURE:  Patient was brought to the operating suite and appropriately identified. General anesthesia was administered along with peri-operative prophylactic IV antibiotics, and endotracheal intubation was performed by anesthesiologist, along with NG/OG tube for gastric decompression. In supine position, operative site was prepped and draped in usual sterile fashion, and following a brief time out, initial 5 mm incision was made in a natural skin crease just above the umbilicus. Fascia was then elevated, and a Verress needle was inserted and its proper position confirmed using aspiration and saline  meniscus test.  Upon insufflation of the abdominal cavity with carbon dioxide to a well-tolerated pressure of 12-15 mmHg, 5 mm peri-umbilical port followed by laparoscope were inserted and used to inspect the abdominal cavity and its contents with no injuries from insertion of the first trochar noted. Three additional trocars were inserted, one at the epigastric position (10 mm) and two along the Right costal margin (5 mm). The table was then placed in reverse Trendelenburg position with the Right side up. Filmy adhesions between the gallbladder and omentum/duodenum/transverse colon were lysed using combined blunt dissection and selective electrocautery. The apex/dome of the gallbladder was grasped with an atraumatic grasper passed through the lateral port and retracted apically over the liver. The infundibulum was also grasped and retracted, exposing Calot's triangle. The peritoneum overlying the gallbladder infundibulum was incised and dissected free of surrounding peritoneal attachments, revealing the cystic duct and cystic artery, which were clipped twice on the patient side and once on the gallbladder specimen side close to the gallbladder. The gallbladder was then dissected from its peritoneal attachments to the liver using electrocautery, and the gallbladder was placed into a laparoscopic specimen bag and removed from the abdominal cavity via the epigastric port site. Hemostasis and secure placement of clips were confirmed, and intra-peritoneal cavity was inspected with no additional findings. PMI laparoscopic fascial closure device was then used to re-approximate fascia at the 10 mm epigastric port site.  All ports were then removed under direct visualization, and abdominal cavity was desuflated. All port sites were irrigated/cleaned, additional local anesthetic was injected at each incision, 3-0 Vicryl was used to re-approximate dermis at 10 mm port site(s), and subcuticular 4-0 Monocryl suture was used  to re-approximate skin. Skin was then cleaned, dried, and sterile skin glue was applied. Patient was then safely able to be awakened, extubated, and transferred to PACU for post-operative monitoring and care.  I was present for all aspects of procedure, and there were no intra-operative complications apparent.

## 2017-08-24 LAB — SURGICAL PATHOLOGY

## 2017-08-31 ENCOUNTER — Ambulatory Visit (INDEPENDENT_AMBULATORY_CARE_PROVIDER_SITE_OTHER): Payer: Medicaid Other | Admitting: Surgery

## 2017-08-31 ENCOUNTER — Encounter: Payer: Self-pay | Admitting: Surgery

## 2017-08-31 VITALS — BP 125/83 | HR 93 | Temp 98.3°F | Ht 60.0 in | Wt 172.0 lb

## 2017-08-31 DIAGNOSIS — K8020 Calculus of gallbladder without cholecystitis without obstruction: Secondary | ICD-10-CM

## 2017-08-31 NOTE — Patient Instructions (Signed)
GENERAL POST-OPERATIVE PATIENT INSTRUCTIONS   WOUND CARE INSTRUCTIONS:  Keep a dry clean dressing on the wound if there is drainage. The initial bandage may be removed after 24 hours.  Once the wound has quit draining you may leave it open to air.  If clothing rubs against the wound or causes irritation and the wound is not draining you may cover it with a dry dressing during the daytime.  Try to keep the wound dry and avoid ointments on the wound unless directed to do so.  If the wound becomes bright red and painful or starts to drain infected material that is not clear, please contact your physician immediately.  If the wound is mildly pink and has a thick firm ridge underneath it, this is normal, and is referred to as a healing ridge.  This will resolve over the next 4-6 weeks.  BATHING: You may shower if you have been informed of this by your surgeon. However, Please do not submerge in a tub, hot tub, or pool until incisions are completely sealed or have been told by your surgeon that you may do so.  DIET:  You may eat any foods that you can tolerate.  It is a good idea to eat a high fiber diet and take in plenty of fluids to prevent constipation.  If you do become constipated you may want to take a mild laxative or take ducolax tablets on a daily basis until your bowel habits are regular.  Constipation can be very uncomfortable, along with straining, after recent surgery.  ACTIVITY:  You are encouraged to cough and deep breath or use your incentive spirometer if you were given one, every 15-30 minutes when awake.  This will help prevent respiratory complications and low grade fevers post-operatively if you had a general anesthetic.  You may want to hug a pillow when coughing and sneezing to add additional support to the surgical area, if you had abdominal or chest surgery, which will decrease pain during these times.  You are encouraged to walk and engage in light activity for the next two weeks.  You  should not lift more than 20 pounds, until 10/03/2017 as it could put you at increased risk for complications.  Twenty pounds is roughly equivalent to a plastic bag of groceries. At that time- Listen to your body when lifting, if you have pain when lifting, stop and then try again in a few days. Soreness after doing exercises or activities of daily living is normal as you get back in to your normal routine.  MEDICATIONS:  Try to take narcotic medications and anti-inflammatory medications, such as tylenol, ibuprofen, naprosyn, etc., with food.  This will minimize stomach upset from the medication.  Should you develop nausea and vomiting from the pain medication, or develop a rash, please discontinue the medication and contact your physician.  You should not drive, make important decisions, or operate machinery when taking narcotic pain medication.  SUNBLOCK Use sun block to incision area over the next year if this area will be exposed to sun. This helps decrease scarring and will allow you avoid a permanent darkened area over your incision.  QUESTIONS:  Please feel free to call our office if you have any questions, and we will be glad to assist you. [phone]    

## 2017-08-31 NOTE — Progress Notes (Signed)
Surgical Clinic Progress/Follow-up Note   HPI:  27 y.o. Female presents to clinic for post-op follow-up evaluation s/p laparoscopic cholecystectomy for symptomatic cholelithiasis. Patient reports complete resolution of pre-operative post-prandial RUQ abdominal pain and has been tolerating regular diet with +flatus and normal BM's, denies N/V, fever/chills, CP, or SOB.  Review of Systems:  Constitutional: denies any other weight loss, fever, chills, or sweats  Eyes: denies any other vision changes, history of eye injury  ENT: denies sore throat, hearing problems  Respiratory: denies shortness of breath, wheezing  Cardiovascular: denies chest pain, palpitations  Gastrointestinal: abdominal pain, N/V, and bowel function as per HPI Musculoskeletal: denies any other joint pains or cramps  Skin: Denies any other rashes or skin discolorations  Neurological: denies any other headache, dizziness, weakness  Psychiatric: denies any other depression, anxiety  All other review of systems: otherwise negative   Vital Signs:  BP 125/83   Pulse 93   Temp 98.3 F (36.8 C) (Oral)   Ht 5' (1.524 m)   Wt 172 lb (78 kg)   LMP 08/07/2017 (Exact Date)   BMI 33.59 kg/m    Physical Exam:  Constitutional:  -- Obese body habitus  -- Awake, alert, and oriented x3  Eyes:  -- Pupils equally round and reactive to light  -- No scleral icterus  Ear, nose, throat:  -- No jugular venous distension  -- No nasal drainage, bleeding Pulmonary:  -- No crackles -- Equal breath sounds bilaterally -- Breathing non-labored at rest Cardiovascular:  -- S1, S2 present  -- No pericardial rubs  Gastrointestinal:  -- Soft, nontender, non-distended, no guarding/rebound -- Incisions well-approximated without surrounding erythema or drainage -- No abdominal masses appreciated, pulsatile or otherwise  Musculoskeletal / Integumentary:  -- Wounds or skin discoloration: None appreciated except post-surgical wounds as  described above (GI) -- Extremities: B/L UE and LE FROM, hands and feet warm, no edema  Neurologic:  -- Motor function: intact and symmetric  -- Sensation: intact and symmetric   Assessment:  27 y.o. yo Female with a problem list including...  Patient Active Problem List   Diagnosis Date Noted  . Cholelithiasis without cholecystitis 08/07/2017  . Postpartum care following cesarean delivery 07/03/2017  . Postpartum anemia 07/03/2017  . Failure to progress in labor, delivered, current hospitalization 07/03/2017  . Encounter for planned induction of labor 06/29/2017  . NST (non-stress test) reactive 06/28/2017    presents to clinic for post-op follow-up evaluation, doing well s/p laparoscopic cholecystectomy for symptomatic cholelithiasis.  Plan:              - advance diet as tolerated              - okay to submerge incisions under water (baths, swimming) prn             - gradually resume all activities without restrictions over next 2 weeks             - apply sunblock particularly to incisions with sun exposure to reduce pigmentation of scars             - return to clinic as needed, instructed to call office if any questions or concerns   All of the above recommendations were discussed with the patient and patient's boyfriend/husband, and all of patient's and family's questions were answered to their expressed satisfaction.  -- Scherrie GerlachJason E. Earlene Plateravis, MD, RPVI [place]: [facility] - Partnering for exceptional care. Office: [phone]

## 2017-10-17 ENCOUNTER — Emergency Department
Admission: EM | Admit: 2017-10-17 | Discharge: 2017-10-17 | Disposition: A | Discharge status: Home / Self Care | Payer: Medicaid Other | Attending: Emergency Medicine | Admitting: Emergency Medicine

## 2017-10-17 ENCOUNTER — Encounter: Payer: Self-pay | Admitting: Emergency Medicine

## 2017-10-17 DIAGNOSIS — Z87891 Personal history of nicotine dependence: Secondary | ICD-10-CM | POA: Diagnosis not present

## 2017-10-17 DIAGNOSIS — R300 Dysuria: Secondary | ICD-10-CM | POA: Diagnosis present

## 2017-10-17 LAB — URINALYSIS, COMPLETE (UACMP) WITH MICROSCOPIC
BACTERIA UA: NONE SEEN
BILIRUBIN URINE: NEGATIVE
Glucose, UA: NEGATIVE mg/dL
Hgb urine dipstick: NEGATIVE
Ketones, ur: NEGATIVE mg/dL
Leukocytes, UA: NEGATIVE
Nitrite: NEGATIVE
PH: 6 (ref 5.0–8.0)
Protein, ur: NEGATIVE mg/dL
SPECIFIC GRAVITY, URINE: 1.008 (ref 1.005–1.030)

## 2017-10-17 LAB — POCT PREGNANCY, URINE: PREG TEST UR: NEGATIVE

## 2017-10-17 MED ORDER — PHENAZOPYRIDINE HCL 200 MG PO TABS: 200.0000 mg | ORAL_TABLET | Freq: Three times a day (TID) | ORAL | 0 refills | Status: DC | PRN

## 2017-10-17 MED ORDER — SULFAMETHOXAZOLE-TRIMETHOPRIM 800-160 MG PO TABS: 1.0000 | ORAL_TABLET | Freq: Two times a day (BID) | ORAL | 0 refills | Status: DC

## 2017-10-17 MED ORDER — PHENAZOPYRIDINE HCL 200 MG PO TABS
200.0000 mg | ORAL_TABLET | Freq: Three times a day (TID) | ORAL | 0 refills | Status: AC | PRN
Start: 2017-10-17 — End: 2017-10-22

## 2017-10-17 MED ORDER — SULFAMETHOXAZOLE-TRIMETHOPRIM 800-160 MG PO TABS: 1.0000 | ORAL_TABLET | Freq: Two times a day (BID) | ORAL | 0 refills | Status: AC

## 2017-10-17 NOTE — ED Triage Notes (Signed)
Pt reports she began having uti sx's Friday. Denies fever. Has had UTI in the past and it feels the same.

## 2017-10-17 NOTE — ED Provider Notes (Signed)
[facility] Emergency Department Provider Note ____________________________________________  Time seen: 1605  I have reviewed the triage vital signs and the nursing notes.  HISTORY  Chief Complaint  Urinary Tract Infection  HPI Rebecca Walton is a 27 y.o. female presents to the ED for evaluation of dysuria. Shereports onset of symptoms since Friday. She reports urinary frequency and urgency. She denies pain or burning or hematuria. She is without pelvic or flank pain.   Past Medical History:  Diagnosis Date  . History of methicillin resistant staphylococcus aureus (MRSA)    as a child on elbow  . PONV (postoperative nausea and vomiting)     Patient Active Problem List   Diagnosis Date Noted  . Calculus of gallbladder without cholecystitis without obstruction 08/07/2017  . Postpartum care following cesarean delivery 07/03/2017  . Postpartum anemia 07/03/2017  . Failure to progress in labor, delivered, current hospitalization 07/03/2017  . Encounter for planned induction of labor 06/29/2017  . NST (non-stress test) reactive 06/28/2017    Past Surgical History:  Procedure Laterality Date  . CESAREAN SECTION N/A 06/29/2017   Procedure: CESAREAN SECTION;  Surgeon: Conard NovakJackson, Stephen D, MD;  Location: [facility] ORS;  Service: Obstetrics;  Laterality: N/A;  . CHOLECYSTECTOMY N/A 08/22/2017   Procedure: LAPAROSCOPIC CHOLECYSTECTOMY;  Surgeon: Ancil Linseyavis, Jason Evan, MD;  Location: [facility] ORS;  Service: General;  Laterality: N/A;    Prior to Admission medications   Medication Sig Start Date End Date Taking? Authorizing Provider  dicyclomine (BENTYL) 10 MG capsule Take 10 mg by mouth as needed for spasms.    [provider]  norelgestromin-ethinyl estradiol (ORTHO EVRA) 150-35 MCG/24HR transdermal patch Place 1 patch onto the skin once a week. 08/11/17   Conard NovakJackson, Stephen D, MD  oxyCODONE-acetaminophen (PERCOCET/ROXICET) 5-325 MG tablet Take 1-2 tablets by mouth  every 4 (four) hours as needed for severe pain. 08/22/17   Ancil Linseyavis, Jason Evan, MD  phenazopyridine (PYRIDIUM) 200 MG tablet Take 1 tablet (200 mg total) by mouth 3 (three) times daily as needed for up to 5 days for pain. 10/17/17 10/22/17  Jaquez Farrington, Charlesetta IvoryJenise V Bacon, PA-C  sulfamethoxazole-trimethoprim (BACTRIM DS,SEPTRA DS) 800-160 MG tablet Take 1 tablet by mouth 2 (two) times daily for 3 days. 10/17/17 10/20/17  Stefano Trulson, Charlesetta IvoryJenise V Bacon, PA-C    Allergies Coconut flavor  History reviewed. No pertinent family history.  Social History Social History   Tobacco Use  . Smoking status: Former Smoker    Types: Cigarettes  . Smokeless tobacco: Never Used  Substance Use Topics  . Alcohol use: No  . Drug use: No    Review of Systems  Constitutional: Negative for fever. Cardiovascular: Negative for chest pain. Respiratory: Negative for shortness of breath. Gastrointestinal: Negative for abdominal pain, vomiting and diarrhea. Genitourinary: Negative for dysuria. Reports urgency and frequency.  Musculoskeletal: Negative for back pain. Skin: Negative for rash. Neurological: Negative for headaches, focal weakness or numbness. ____________________________________________  PHYSICAL EXAM:  VITAL SIGNS: ED Triage Vitals  Enc Vitals Group     BP 10/17/17 1442 119/80     Pulse Rate 10/17/17 1442 77     Resp 10/17/17 1442 18     Temp 10/17/17 1442 98.2 F (36.8 C)     Temp Source 10/17/17 1442 Oral     SpO2 10/17/17 1442 100 %     Weight 10/17/17 1442 172 lb (78 kg)     Height 10/17/17 1442 5' (1.524 m)     Head Circumference --  Peak Flow --      Pain Score 10/17/17 1627 2     Pain Loc --      Pain Edu? --      Excl. in GC? --     Constitutional: Alert and oriented. Well appearing and in no distress. Head: Normocephalic and atraumatic. Cardiovascular: Normal rate, regular rhythm. Normal distal pulses. Respiratory: Normal respiratory effort. No wheezes/rales/rhonchi. Gastrointestinal:  Soft and nontender. No distention. No CVA Tenderness.  Musculoskeletal: Nontender with normal range of motion in all extremities.  Neurologic:  Normal gait without ataxia. Normal speech and language. No gross focal neurologic deficits are appreciated. ____________________________________________   LABS (pertinent positives/negatives)  Labs Reviewed  URINALYSIS, COMPLETE (UACMP) WITH MICROSCOPIC - Abnormal; Notable for the following components:      Result Value   Color, Urine STRAW (*)    APPearance CLEAR (*)    Squamous Epithelial / LPF 0-5 (*)    All other components within normal limits  POC URINE PREG, ED  POCT PREGNANCY, URINE  ____________________________________________  INITIAL IMPRESSION / ASSESSMENT AND PLAN / ED COURSE  Patient with ED evaluation of urinary frequency and urgency. Her lab does not reveal leukocyturia or hematuria. She has declined any further (GU) testing at this time. She will be discharged with a prescription for Bactrim DS and phenazopyridine. She is advised to see her provider for continued symptoms.  ____________________________________________  FINAL CLINICAL IMPRESSION(S) / ED DIAGNOSES  Final diagnoses:  Dysuria     Karmen Stabs, Charlesetta Ivory, PA-C 10/17/17 2355    Sharman Cheek, MD 10/20/17 [phone]

## 2017-10-17 NOTE — Discharge Instructions (Signed)
Your exam and labs are essentially normal. You are being treated for dysuria and frequency. Take the prescription meds as directed. Drink plenty of fluids and empty your bladder regularly.

## 2017-10-17 NOTE — ED Notes (Signed)
See triage note.statesshe developed some dysuria and urinary pressure  Denies any n/v/d or fever

## 2017-11-16 ENCOUNTER — Emergency Department
Admission: EM | Admit: 2017-11-16 | Discharge: 2017-11-16 | Disposition: A | Discharge status: Home / Self Care | Payer: Medicaid Other | Attending: Emergency Medicine | Admitting: Emergency Medicine

## 2017-11-16 ENCOUNTER — Other Ambulatory Visit: Payer: Self-pay

## 2017-11-16 DIAGNOSIS — N898 Other specified noninflammatory disorders of vagina: Secondary | ICD-10-CM | POA: Diagnosis not present

## 2017-11-16 DIAGNOSIS — Z87891 Personal history of nicotine dependence: Secondary | ICD-10-CM | POA: Diagnosis not present

## 2017-11-16 DIAGNOSIS — R21 Rash and other nonspecific skin eruption: Secondary | ICD-10-CM | POA: Diagnosis present

## 2017-11-16 LAB — URINALYSIS, COMPLETE (UACMP) WITH MICROSCOPIC
Bilirubin Urine: NEGATIVE
Glucose, UA: NEGATIVE mg/dL
KETONES UR: NEGATIVE mg/dL
Nitrite: NEGATIVE
PH: 6 (ref 5.0–8.0)
Protein, ur: 30 mg/dL — AB
RBC / HPF: >50 RBC/hpf — ABNORMAL HIGH (ref 0–5)
SPECIFIC GRAVITY, URINE: 1.023 (ref 1.005–1.030)

## 2017-11-16 LAB — POCT PREGNANCY, URINE: Preg Test, Ur: NEGATIVE

## 2017-11-16 MED ORDER — CEPHALEXIN 500 MG PO CAPS: 500.0000 mg | ORAL_CAPSULE | Freq: Three times a day (TID) | ORAL | 0 refills | Status: AC

## 2017-11-16 MED ORDER — FLUCONAZOLE 50 MG PO TABS
150.0000 mg | ORAL_TABLET | Freq: Once | ORAL | Status: AC
  Administered 2017-11-16: 150 mg via ORAL
  Filled 2017-11-16: qty 3

## 2017-11-16 NOTE — ED Notes (Signed)
Topez not working 

## 2017-11-16 NOTE — Discharge Instructions (Addendum)
return to the emergency room for any new or worrisome symptoms including increased pain, fever, vomiting, or other concerns. You  would prefer not to have a full pelvic exam here, which limits our ability to fully evaluate you. Please follow up closely with her OB/GYN.

## 2017-11-16 NOTE — ED Provider Notes (Signed)
[facility] Emergency Department Provider Note  ____________________________________________   I have reviewed the triage vital signs and the nursing notes. Where available I have reviewed prior notes and, if possible and indicated, outside hospital notes.    HISTORY  Chief Complaint Vaginal Itching    HPI Rebecca Walton is a 27 y.o. female   no history of STI but does complain of recurrent "itchy bumps" bacterial infection" on the outside of her vagina in the past. No history of herpes. History is taken with her husband out of the room. Has had symptoms for a couple days. No fever no vaginal discharge no other complaint. Nothing  makes itbetter, nothing makes it worse, no other associated symptoms.       Past Medical History:  Diagnosis Date  . History of methicillin resistant staphylococcus aureus (MRSA)    as a child on elbow  . PONV (postoperative nausea and vomiting)     Patient Active Problem List   Diagnosis Date Noted  . Calculus of gallbladder without cholecystitis without obstruction 08/07/2017  . Postpartum care following cesarean delivery 07/03/2017  . Postpartum anemia 07/03/2017  . Failure to progress in labor, delivered, current hospitalization 07/03/2017  . Encounter for planned induction of labor 06/29/2017  . NST (non-stress test) reactive 06/28/2017    Past Surgical History:  Procedure Laterality Date  . CESAREAN SECTION N/A 06/29/2017   Procedure: CESAREAN SECTION;  Surgeon: Conard Novak, MD;  Location: [facility] ORS;  Service: Obstetrics;  Laterality: N/A;  . CHOLECYSTECTOMY N/A 08/22/2017   Procedure: LAPAROSCOPIC CHOLECYSTECTOMY;  Surgeon: Ancil Linsey, MD;  Location: [facility] ORS;  Service: General;  Laterality: N/A;    Prior to Admission medications   Medication Sig Start Date End Date Taking? Authorizing Provider  dicyclomine (BENTYL) 10 MG capsule Take 10 mg by mouth as needed for spasms.    [provider]  norelgestromin-ethinyl estradiol (ORTHO EVRA) 150-35 MCG/24HR transdermal patch Place 1 patch onto the skin once a week. 08/11/17   Conard Novak, MD  oxyCODONE-acetaminophen (PERCOCET/ROXICET) 5-325 MG tablet Take 1-2 tablets by mouth every 4 (four) hours as needed for severe pain. 08/22/17   Ancil Linsey, MD    Allergies Coconut flavor  No family history on file.  Social History Social History   Tobacco Use  . Smoking status: Former Smoker    Types: Cigarettes  . Smokeless tobacco: Never Used  Substance Use Topics  . Alcohol use: No  . Drug use: No    Review of Systems see history of present illness   ____________________________________________   PHYSICAL EXAM:  VITAL SIGNS: ED Triage Vitals  Enc Vitals Group     BP 11/16/17 2058 127/87     Pulse Rate 11/16/17 2058 89     Resp 11/16/17 2058 20     Temp 11/16/17 2058 98.7 F (37.1 C)     Temp Source 11/16/17 2058 Oral     SpO2 11/16/17 2058 100 %     Weight 11/16/17 2059 172 lb (78 kg)     Height 11/16/17 2059 5' (1.524 m)     Head Circumference --      Peak Flow --      Pain Score 11/16/17 2059 2     Pain Loc --      Pain Edu? --      Excl. in GC? --     Constitutional: Alert and oriented. Well appearing and in no acute distress.  GU: Patient  declines full pelvic exam with speculum, there are 2 areas of mild erythema noted to the labial region, they are possibly somewhat excoriated, they are not ulcerous, but there is some surrounding erythema. No herpetic lesions definitively noted. No other lesions noted no abscess. Female nurse chaperone present. Skin:  Skin is warm, dry and intact. No rash noted. Psychiatric: Mood and affect are normal. Speech and behavior are normal.  ____________________________________________   LABS (all labs ordered are listed, but only abnormal results are displayed)  Labs Reviewed  CHLAMYDIA/NGC RT PCR ([facility] ONLY)  WET PREP, GENITAL  URINALYSIS,  COMPLETE (UACMP) WITH MICROSCOPIC  POC URINE PREG, ED  POCT PREGNANCY, URINE    Pertinent labs  results that were available during my care of the patient were reviewed by me and considered in my medical decision making (see chart for details). ____________________________________________  EKG  I personally interpreted any EKGs ordered by me or triage  ____________________________________________  RADIOLOGY  Pertinent labs & imaging results that were available during my care of the patient were reviewed by me and considered in my medical decision making (see chart for details). If possible, patient and/or family made aware of any abnormal findings.  No results found. ____________________________________________    PROCEDURES  Procedure(s) performed: None  Procedures  Critical Care performed: None  ____________________________________________   INITIAL IMPRESSION / ASSESSMENT AND PLAN / ED COURSE  Pertinent labs & imaging results that were available during my care of the patient were reviewed by me and considered in my medical decision making (see chart for details).  patient states that when she gets these lesions she gets better with Keflex, this recently could be small infections, localized. Patient declines pelvic exam we are unable therefore fully rule out herpes but there are no characteristic herpetic lesions at this time. We will refer her to OB/GYN, we will give her antibiotics. It is also possible this could represent a small yeast infection I'll give her a dose of Diflucan prior to discharge. Return precautions and follow-up given and understood. Patient understands the limitations of the workup without formal pelvic exam.    ____________________________________________   FINAL CLINICAL IMPRESSION(S) / ED DIAGNOSES  Final diagnoses:  None      This chart was dictated using voice recognition software.  Despite best efforts to proofread,  errors can occur  which can change meaning.      Jeanmarie Plant, MD 11/16/17 2204

## 2017-11-16 NOTE — ED Triage Notes (Signed)
Pt in with co vaginal burning states it is on the outside with no abnormal discharge. Hx of bacterial infection.

## 2018-03-27 DIAGNOSIS — J45909 Unspecified asthma, uncomplicated: Secondary | ICD-10-CM | POA: Insufficient documentation

## 2018-03-27 DIAGNOSIS — F70 Mild intellectual disabilities: Secondary | ICD-10-CM | POA: Insufficient documentation

## 2018-03-27 LAB — HM PAP SMEAR: HM Pap smear: POSITIVE

## 2018-04-05 DIAGNOSIS — R87612 Low grade squamous intraepithelial lesion on cytologic smear of cervix (LGSIL): Secondary | ICD-10-CM | POA: Insufficient documentation

## 2018-04-05 HISTORY — DX: Low grade squamous intraepithelial lesion on cytologic smear of cervix (LGSIL): R87.612

## 2018-05-15 ENCOUNTER — Encounter: Payer: Self-pay | Admitting: Obstetrics and Gynecology

## 2018-05-15 ENCOUNTER — Ambulatory Visit (INDEPENDENT_AMBULATORY_CARE_PROVIDER_SITE_OTHER): Payer: Medicaid Other | Admitting: Obstetrics and Gynecology

## 2018-05-15 ENCOUNTER — Other Ambulatory Visit (HOSPITAL_COMMUNITY)
Admission: RE | Admit: 2018-05-15 | Discharge: 2018-05-15 | Disposition: A | Discharge status: Home / Self Care | Payer: Medicaid Other | Source: Ambulatory Visit | Attending: Obstetrics and Gynecology | Admitting: Obstetrics and Gynecology

## 2018-05-15 VITALS — BP 130/84 | HR 80 | Ht 60.0 in | Wt 216.5 lb

## 2018-05-15 DIAGNOSIS — R87612 Low grade squamous intraepithelial lesion on cytologic smear of cervix (LGSIL): Secondary | ICD-10-CM

## 2018-05-15 NOTE — Patient Instructions (Signed)
Colposcopy, Care After  This sheet gives you information about how to care for yourself after your procedure. Your doctor may also give you more specific instructions. If you have problems or questions, contact your doctor.  What can I expect after the procedure?  If you did not have a tissue sample removed (did not have a biopsy), you may only have some spotting for a few days. You can go back to your normal activities.  If you had a tissue sample removed, it is common to have:  · Soreness and pain. This may last for a few days.  · Light-headedness.  · Mild bleeding from your vagina or dark-colored, grainy discharge from your vagina. This may last for a few days. You may need to wear a sanitary pad.  · Spotting for at least 48 hours after the procedure.    Follow these instructions at home:  · Take over-the-counter and prescription medicines only as told by your doctor. Ask your doctor what medicines you can start taking again. This is very important if you take blood-thinning medicine.  · Do not drive or use heavy machinery while taking prescription pain medicine.  · For 3 days, or as long as your doctor tells you, avoid:  ? Douching.  ? Using tampons.  ? Having sex.  · If you use birth control (contraception), keep using it.  · Limit activity for the first day after the procedure. Ask your doctor what activities are safe for you.  · It is up to you to get the results of your procedure. Ask your doctor when your results will be ready.  · Keep all follow-up visits as told by your doctor. This is important.  Contact a doctor if:  · You get a skin rash.  Get help right away if:  · You are bleeding a lot from your vagina. It is a lot of bleeding if you are using more than one pad an hour for 2 hours in a row.  · You have clumps of blood (blood clots) coming from your vagina.  · You have a fever.  · You have chills  · You have pain in your lower belly (pelvic area).  · You have signs of infection, such as vaginal  discharge that is:  ? Different than usual.  ? Yellow.  ? Bad-smelling.  · You have very pain or cramps in your lower belly that do not get better with medicine.  · You feel light-headed.  · You feel dizzy.  · You pass out (faint).  Summary  · If you did not have a tissue sample removed (did not have a biopsy), you may only have some spotting for a few days. You can go back to your normal activities.  · If you had a tissue sample removed, it is common to have mild pain and spotting for 48 hours.  · For 3 days, or as long as your doctor tells you, avoid douching, using tampons and having sex.  · Get help right away if you have bleeding, very bad pain, or signs of infection.  This information is not intended to replace advice given to you by your health care provider. Make sure you discuss any questions you have with your health care provider.  Document Released: 12/21/2007 Document Revised: 03/23/2016 Document Reviewed: 03/23/2016  Elsevier Interactive Patient Education © 2018 Elsevier Inc.

## 2018-05-15 NOTE — Progress Notes (Signed)
   GYNECOLOGY CLINIC COLPOSCOPY PROCEDURE NOTE  27 y.o. G1P1001 here for colposcopy for low-grade squamous intraepithelial neoplasia (LGSIL - encompassing HPV,mild dysplasia,CIN I)  pap smear on 03/27/18. Discussed underlying role for HPV infection in the development of cervical dysplasia, its natural history and progression/regression, need for surveillance.  Is the patient  pregnant: No LMP: Patient's last menstrual period was 04/17/2018 (exact date). Smoking status:  reports that she has quit smoking. Her smoking use included cigarettes. She has never used smokeless tobacco. Contraception: Ortho-Evra patches weekly Future fertility desired:  Yes  Patient given informed consent, signed copy in the chart, time out was performed.  The patient was position in dorsal lithotomy position. Speculum was placed the cervix was visualized.   After application of acetic acid colposcopic inspection of the cervix was undertaken.   Colposcopy adequate, full visualization of transformation zone: No no visible lesions; random 3 o'clock biopsy obtained.   ECC specimen obtained:  Yes  All specimens were labeled and sent to pathology.   Patient was given post procedure instructions.  Will follow up pathology and manage accordingly.  Routine preventative health maintenance measures emphasized.  Physical Exam  Genitourinary:       Adelene Idler MD [facility], [facility] 05/15/18 3:04 PM

## 2018-05-28 NOTE — Progress Notes (Signed)
CIN1- repeat pap in 1 year. Called and left message on phone, sent message to [facility] as well.

## 2018-06-17 ENCOUNTER — Other Ambulatory Visit: Payer: Self-pay

## 2018-06-17 ENCOUNTER — Emergency Department
Admission: EM | Admit: 2018-06-17 | Discharge: 2018-06-17 | Disposition: A | Discharge status: Home / Self Care | Payer: Medicaid Other | Attending: Emergency Medicine | Admitting: Emergency Medicine

## 2018-06-17 ENCOUNTER — Encounter: Payer: Self-pay | Admitting: Emergency Medicine

## 2018-06-17 DIAGNOSIS — R1031 Right lower quadrant pain: Secondary | ICD-10-CM | POA: Insufficient documentation

## 2018-06-17 DIAGNOSIS — Z87891 Personal history of nicotine dependence: Secondary | ICD-10-CM | POA: Insufficient documentation

## 2018-06-17 DIAGNOSIS — R110 Nausea: Secondary | ICD-10-CM | POA: Insufficient documentation

## 2018-06-17 DIAGNOSIS — R1030 Lower abdominal pain, unspecified: Secondary | ICD-10-CM

## 2018-06-17 LAB — LIPASE, BLOOD: Lipase: 29 U/L (ref 11–51)

## 2018-06-17 LAB — CBC
HCT: 39.7 % (ref 36.0–46.0)
HEMOGLOBIN: 12 g/dL (ref 12.0–15.0)
MCH: 24.7 pg — AB (ref 26.0–34.0)
MCHC: 30.2 g/dL (ref 30.0–36.0)
MCV: 81.9 fL (ref 80.0–100.0)
Platelets: 245 10*3/uL (ref 150–400)
RBC: 4.85 MIL/uL (ref 3.87–5.11)
RDW: 14.6 % (ref 11.5–15.5)
WBC: 6.8 10*3/uL (ref 4.0–10.5)
nRBC: 0 % (ref 0.0–0.2)

## 2018-06-17 LAB — COMPREHENSIVE METABOLIC PANEL
ALBUMIN: 3.8 g/dL (ref 3.5–5.0)
ALT: 31 U/L (ref 0–44)
ANION GAP: 7 (ref 5–15)
AST: 48 U/L — ABNORMAL HIGH (ref 15–41)
Alkaline Phosphatase: 82 U/L (ref 38–126)
BUN: 12 mg/dL (ref 6–20)
CHLORIDE: 108 mmol/L (ref 98–111)
CO2: 25 mmol/L (ref 22–32)
Calcium: 8.9 mg/dL (ref 8.9–10.3)
Creatinine, Ser: 0.54 mg/dL (ref 0.44–1.00)
GFR calc Af Amer: >60 mL/min (ref >60)
GFR calc non Af Amer: >60 mL/min (ref >60)
GLUCOSE: 135 mg/dL — AB (ref 70–99)
POTASSIUM: 4.1 mmol/L (ref 3.5–5.1)
SODIUM: 140 mmol/L (ref 135–145)
Total Bilirubin: 0.4 mg/dL (ref 0.3–1.2)
Total Protein: 7.1 g/dL (ref 6.5–8.1)

## 2018-06-17 LAB — URINALYSIS, COMPLETE (UACMP) WITH MICROSCOPIC
Bilirubin Urine: NEGATIVE
Glucose, UA: NEGATIVE mg/dL
Hgb urine dipstick: NEGATIVE
Ketones, ur: NEGATIVE mg/dL
Leukocytes, UA: NEGATIVE
Nitrite: NEGATIVE
PROTEIN: NEGATIVE mg/dL
Specific Gravity, Urine: 1.019 (ref 1.005–1.030)
pH: 5 (ref 5.0–8.0)

## 2018-06-17 LAB — POCT PREGNANCY, URINE: PREG TEST UR: NEGATIVE

## 2018-06-17 NOTE — Discharge Instructions (Addendum)
Return to the ER for new, worsening, persistent severe abdominal pain, vomiting, fever, or any other new or worsening symptoms that concern you.

## 2018-06-17 NOTE — ED Triage Notes (Signed)
Here for right lower abdominal pain. Denies vomiting or diarrhea but has had some nausea. Pain intermittent X 1 week. No pain at this time. VSS. Ambulatory. Denies urinary sx.  Denies vaginal sx.

## 2018-06-17 NOTE — ED Provider Notes (Signed)
[facility] Emergency Department Provider Note ____________________________________________   First MD Initiated Contact with Patient 06/17/18 1234     (approximate)  I have reviewed the triage vital signs and the nursing notes.   HISTORY  Chief Complaint Abdominal Pain    HPI Rebecca Walton is a 27 y.o. female with PMH as noted below who presents with abdominal pain, mainly suprapubic in the right lower quadrant, and intermittent over the last week.  The patient had slightly worse pain last night and had to miss work today, so she mainly came in today because she needed a note for work.  She denies any pain currently.  The patient reports some associated nausea and she thinks that she had a fever a few days ago, but denies vomiting or diarrhea.  She denies vaginal bleeding or discharge, or any urinary symptoms.   Past Medical History:  Diagnosis Date  . History of methicillin resistant staphylococcus aureus (MRSA)    as a child on elbow  . PONV (postoperative nausea and vomiting)     Patient Active Problem List   Diagnosis Date Noted  . Calculus of gallbladder without cholecystitis without obstruction 08/07/2017  . Postpartum care following cesarean delivery 07/03/2017  . Postpartum anemia 07/03/2017  . Failure to progress in labor, delivered, current hospitalization 07/03/2017  . Encounter for planned induction of labor 06/29/2017  . NST (non-stress test) reactive 06/28/2017    Past Surgical History:  Procedure Laterality Date  . CESAREAN SECTION N/A 06/29/2017   Procedure: CESAREAN SECTION;  Surgeon: Conard NovakJackson, Stephen D, MD;  Location: [facility] ORS;  Service: Obstetrics;  Laterality: N/A;  . CHOLECYSTECTOMY N/A 08/22/2017   Procedure: LAPAROSCOPIC CHOLECYSTECTOMY;  Surgeon: Ancil Linseyavis, Jason Evan, MD;  Location: [facility] ORS;  Service: General;  Laterality: N/A;    Prior to Admission medications   Medication Sig Start Date End Date Taking? Authorizing  Provider  norelgestromin-ethinyl estradiol (ORTHO EVRA) 150-35 MCG/24HR transdermal patch Place 1 patch onto the skin once a week. Patient not taking: Reported on 05/15/2018 08/11/17   Conard NovakJackson, Stephen D, MD  oxyCODONE-acetaminophen (PERCOCET/ROXICET) 5-325 MG tablet Take 1-2 tablets by mouth every 4 (four) hours as needed for severe pain. Patient not taking: Reported on 05/15/2018 08/22/17   Ancil Linseyavis, Jason Evan, MD    Allergies Coconut flavor  History reviewed. No pertinent family history.  Social History Social History   Tobacco Use  . Smoking status: Former Smoker    Types: Cigarettes  . Smokeless tobacco: Never Used  Substance Use Topics  . Alcohol use: Yes  . Drug use: No    Review of Systems  Constitutional: No fever. Eyes: No redness. ENT: No sore throat. Cardiovascular: Denies chest pain. Respiratory: Denies shortness of breath. Gastrointestinal: Positive for resolved nausea.  Genitourinary: Negative for dysuria.  Musculoskeletal: Negative for back pain. Skin: Negative for rash. Neurological: Negative for headache.   ____________________________________________   PHYSICAL EXAM:  VITAL SIGNS: ED Triage Vitals  Enc Vitals Group     BP 06/17/18 1207 115/89     Pulse Rate 06/17/18 1207 75     Resp 06/17/18 1207 16     Temp 06/17/18 1207 98.5 F (36.9 C)     Temp Source 06/17/18 1207 Oral     SpO2 06/17/18 1207 100 %     Weight 06/17/18 1203 190 lb (86.2 kg)     Height 06/17/18 1203 5' (1.524 m)     Head Circumference --      Peak Flow --  Pain Score 06/17/18 1203 0     Pain Loc --      Pain Edu? --      Excl. in GC? --     Constitutional: Alert and oriented. Well appearing and in no acute distress. Eyes: Conjunctivae are normal.  Head: Atraumatic. Nose: No congestion/rhinnorhea. Mouth/Throat: Mucous membranes are moist.   Neck: Normal range of motion.  Cardiovascular:  Good peripheral circulation. Respiratory: Normal respiratory effort.  No  retractions.  Gastrointestinal: Soft and nontender. No distention.  Genitourinary: No CVA tenderness. Musculoskeletal: Extremities warm and well perfused.  Neurologic:  Normal speech and language. No gross focal neurologic deficits are appreciated.  Skin:  Skin is warm and dry. No rash noted. Psychiatric: Mood and affect are normal. Speech and behavior are normal.  ____________________________________________   LABS (all labs ordered are listed, but only abnormal results are displayed)  Labs Reviewed  COMPREHENSIVE METABOLIC PANEL - Abnormal; Notable for the following components:      Result Value   Glucose, Bld 135 (*)    AST 48 (*)    All other components within normal limits  CBC - Abnormal; Notable for the following components:   MCH 24.7 (*)    All other components within normal limits  URINALYSIS, COMPLETE (UACMP) WITH MICROSCOPIC - Abnormal; Notable for the following components:   Color, Urine YELLOW (*)    APPearance CLEAR (*)    Bacteria, UA RARE (*)    All other components within normal limits  LIPASE, BLOOD  POC URINE PREG, ED  POCT PREGNANCY, URINE   ____________________________________________  EKG   ____________________________________________  RADIOLOGY    ____________________________________________   PROCEDURES  Procedure(s) performed: No  Procedures  Critical Care performed: No ____________________________________________   INITIAL IMPRESSION / ASSESSMENT AND PLAN / ED COURSE  Pertinent labs & imaging results that were available during my care of the patient were reviewed by me and considered in my medical decision making (see chart for details).  27 year old female with PMH as noted above presents with crampy intermittent suprapubic and right lower quadrant pain over the last week which was somewhat worse last night causing her to miss work this morning.  Patient has no pain currently.  She reports some associated nausea earlier.  No  significant associated symptoms.  On exam she is well-appearing and her vital signs are normal.  The abdomen is soft and nontender.  Overall I suspect benign cause such as gastroenteritis, or possibly ovarian cyst or mittelschmerz.  There is no indication for imaging based on the patient's presentation.  We will obtain labs and UA and I anticipate discharge home if these show no significant findings.  ----------------------------------------- 1:47 PM on 06/17/2018 -----------------------------------------  The labs and UA are unremarkable.  The patient remains asymptomatic.  She is stable for discharge home at this time.  I counseled her on the likely causes of her pain and on return precautions.  She expressed understanding. ____________________________________________   FINAL CLINICAL IMPRESSION(S) / ED DIAGNOSES  Final diagnoses:  Lower abdominal pain      NEW MEDICATIONS STARTED DURING THIS VISIT:  New Prescriptions   No medications on file     Note:  This document was prepared using Dragon voice recognition software and may include unintentional dictation errors.    Dionne Bucy, MD 06/17/18 1347

## 2019-05-17 ENCOUNTER — Telehealth: Payer: Self-pay

## 2019-05-17 NOTE — Telephone Encounter (Signed)
TC with patient.  Informed time for pap. Patient states needs BC also. Appt scheduled 05/22/19. Medicaid has expired and needs to reapply. Aileen Fass, RN

## 2019-05-21 DIAGNOSIS — F70 Mild intellectual disabilities: Secondary | ICD-10-CM

## 2019-05-21 DIAGNOSIS — R87612 Low grade squamous intraepithelial lesion on cytologic smear of cervix (LGSIL): Secondary | ICD-10-CM

## 2019-05-22 ENCOUNTER — Ambulatory Visit (LOCAL_COMMUNITY_HEALTH_CENTER): Payer: Self-pay | Admitting: Family Medicine

## 2019-05-22 ENCOUNTER — Other Ambulatory Visit: Payer: Self-pay

## 2019-05-22 ENCOUNTER — Encounter: Payer: Self-pay | Admitting: Family Medicine

## 2019-05-22 VITALS — BP 130/86 | Ht 61.0 in | Wt 230.0 lb

## 2019-05-22 DIAGNOSIS — Z3009 Encounter for other general counseling and advice on contraception: Secondary | ICD-10-CM

## 2019-05-22 DIAGNOSIS — R87612 Low grade squamous intraepithelial lesion on cytologic smear of cervix (LGSIL): Secondary | ICD-10-CM

## 2019-05-22 LAB — PREGNANCY, URINE: Preg Test, Ur: NEGATIVE

## 2019-05-22 MED ORDER — NORGESTIM-ETH ESTRAD TRIPHASIC 0.18/0.215/0.25 MG-35 MCG PO TABS: 1.0000 | ORAL_TABLET | Freq: Every day | ORAL | 12 refills | Status: DC

## 2019-05-22 MED ORDER — NORGESTIM-ETH ESTRAD TRIPHASIC 0.18/0.215/0.25 MG-25 MCG PO TABS: 1.0000 | ORAL_TABLET | Freq: Every day | ORAL | 11 refills | Status: DC

## 2019-05-22 MED ORDER — NORGESTIM-ETH ESTRAD TRIPHASIC 0.18/0.215/0.25 MG-25 MCG PO TABS: 1.0000 | ORAL_TABLET | Freq: Every day | ORAL | 8 refills | Status: DC

## 2019-05-22 NOTE — Progress Notes (Signed)
Family Planning Visit- Repeat Yearly Visit  Subjective:  Rebecca Walton is a 28 y.o. being seen today for an well woman visit and to discuss family planning options.    She is currently using none for pregnancy prevention. Patient reports she does not  or her partner wants a pregnancy in the next year. Patient  has Postpartum anemia; Calculus of gallbladder without cholecystitis without obstruction; Pap smear abnormality of cervix with LGSIL; Mild intellectual disability; Asthma; and Morbid obesity (HCC) on their problem list.  Chief Complaint  Patient presents with  . Contraception    desires BC patch  . Gynecologic Exam    needs pap smear    Patient reports she is here for birth control and pap smear.  She had abnormal pap in 2019-LSIL, +HPV.  Colpo was done @[facility]  05/15/2018.  Her last period was 03/2019-states she has irreg. Periods and has had negative PTs.  Last negative PT was about 1-2 wks ago.   She and husband aren't using birth control/condoms.  In the past 10-14 days she has had unprotected sex 2-3 times. Client is interested in the patch as her BCM. She is applying for medicaid.  Patient denies she has no concerns  Does the patient desire a pregnancy in the next year? (OKQ flowsheet)  See flowsheet for other program required questions.   Body mass index is 43.46 kg/m. - Patient is eligible for diabetes screening based on BMI and age >50?  not applicable HA1C ordered? not applicable  Patient reports 1 of partners in last year. Desires STI screening?  No  Does the patient have a current or past history of drug use? No   No components found for: HCV]   Health Maintenance Due  Topic Date Due  . TETANUS/TDAP  01/12/2011  . INFLUENZA VACCINE  02/16/2019    ROS  The following portions of the patient's history were reviewed and updated as appropriate: allergies, current medications, past family history, past medical history, past social history, past surgical history  and problem list. Problem list updated.  Objective:   Vitals:   05/22/19 0912  BP: 130/86  Weight: 230 lb (104.3 kg)  Height: 5\' 1"  (1.549 m)    Physical Exam Constitutional:      Appearance: She is obese.  Genitourinary:    General: Normal vulva.     Vagina: Normal. No vaginal discharge.     Cervix: Normal.     Comments: Bi manual not indicated Neurological:     Mental Status: She is alert.    Assessment and Plan:  Rebecca Walton is a 28 y.o. female presenting to the [facility] Department for an initial well woman exam/family planning visit  Contraception counseling: Reviewed all forms of birth control options available including abstinence; over the counter/barrier methods; hormonal contraceptive medication including pill, patch, ring, injection,contraceptive implant; hormonal and nonhormonal IUDs; permanent sterilization options including vasectomy and the various tubal sterilization modalities. Risks and benefits reviewed.  Questions were answered.  Written information was also given to the patient to review.  Patient desires Ortho tri Cyclen, this was prescribed for patient. She will follow up in  4-5 months for refill on OCPs/surveillance.  She was told to call with any further questions, or with any concerns about this method of contraception.  Emphasized use of condoms 100% of the time for STI prevention.  1. General counseling and advice on contraceptive management Recheck BP today Discussed with client that the patch was an expensive.  She states that she has not insurance and wouldn't be financially able to afford the patch.  Also discussed with client that the patch's effectiveness has been studied in women < 190 lbs.  Co. That path may not be best option for her.   - based on sexual history client isn't a cannidate for ECPs - Pregnancy, urine- If negative may have OCPs - Norgestimate-Ethinyl Estradiol Triphasic 0.18/0.215/0.25 MG-35 MCG tab; Take 1 tablet  by mouth daily.  Dispense: 5 Package; Refill: 8 packs. Co. To start OCPs today and to perform PT in 2-3 weeks.  IF negative con't with pills, if positive contact clinic  2. LGSIL on Pap smear of cervix  - IGP, rfx Aptima HPV ASCU     No follow-ups on file.  No future appointments.  Hassell Done, FNP

## 2019-05-22 NOTE — Progress Notes (Signed)
UPT is negative today. Repeat BP is 119/79 and Hassell Done, FNP made aware. Pt received Tri Sprintec (Ortho Tri Cyclen) #5 packs today (due to supply available and expiration date) per Hassell Done, FNP verbal orders. Counseled pt per provider orders and pt states understanding. Provider orders completed.Ronny Bacon, RN

## 2019-05-22 NOTE — Progress Notes (Signed)
Pt here for pap smear and to get on BCM. Pt desires the patch. Pt reports last period was 03/19/2019 but that she sometimes skips a month or 2 of her period. Pt's last sex ~05/20/2019 without condoms.Ronny Bacon, RN

## 2019-06-10 LAB — IGP, RFX APTIMA HPV ASCU: PAP Smear Comment: 0

## 2019-06-11 ENCOUNTER — Telehealth: Payer: Self-pay

## 2019-06-11 NOTE — Telephone Encounter (Signed)
My chart message sent requesting patient contact. Aileen Fass, RN

## 2019-06-11 NOTE — Telephone Encounter (Signed)
TC from patient. Informed of abnormal pap and need for colpo. States she has applied for medicaid but hasn't heard anything.  Discussed B[facility]CP program. Patient would like referral to [facility]CP. Aileen Fass, RN   Referral completed Aileen Fass, RN

## 2019-07-01 ENCOUNTER — Telehealth: Payer: Self-pay | Admitting: *Deleted

## 2019-07-23 ENCOUNTER — Other Ambulatory Visit: Payer: Self-pay

## 2019-07-24 ENCOUNTER — Ambulatory Visit: Payer: Medicaid Other | Admitting: Obstetrics & Gynecology

## 2019-07-24 ENCOUNTER — Ambulatory Visit: Payer: Self-pay | Attending: Oncology

## 2019-07-24 DIAGNOSIS — R87612 Low grade squamous intraepithelial lesion on cytologic smear of cervix (LGSIL): Secondary | ICD-10-CM

## 2019-07-24 NOTE — Progress Notes (Signed)
Patient prescreened for BCCCP eligibility, due to COVID, using two patient identifiers.  To go directly to [facility] for consult, and colposcopy with Dr. Tiburcio Pea.

## 2019-08-09 ENCOUNTER — Telehealth: Payer: Self-pay

## 2019-09-23 NOTE — Telephone Encounter (Signed)
Attempted TC to patient.  Left message on VM that myself and 2 other individuals have been trying to reach out to patient re: colpo scheduling.  Patient is aware that she needs colpo and has cancelled previous appts. Richmond Campbell, RN

## 2019-10-07 ENCOUNTER — Encounter: Payer: Self-pay | Admitting: Obstetrics & Gynecology

## 2019-10-07 ENCOUNTER — Ambulatory Visit (INDEPENDENT_AMBULATORY_CARE_PROVIDER_SITE_OTHER): Payer: Self-pay | Admitting: Obstetrics & Gynecology

## 2019-10-07 ENCOUNTER — Other Ambulatory Visit (HOSPITAL_COMMUNITY)
Admission: RE | Admit: 2019-10-07 | Discharge: 2019-10-07 | Disposition: A | Discharge status: Home / Self Care | Payer: Medicaid Other | Source: Ambulatory Visit | Attending: Obstetrics & Gynecology | Admitting: Obstetrics & Gynecology

## 2019-10-07 ENCOUNTER — Other Ambulatory Visit: Payer: Self-pay

## 2019-10-07 VITALS — BP 120/80 | Ht 60.0 in | Wt 227.0 lb

## 2019-10-07 DIAGNOSIS — N871 Moderate cervical dysplasia: Secondary | ICD-10-CM

## 2019-10-07 DIAGNOSIS — R87612 Low grade squamous intraepithelial lesion on cytologic smear of cervix (LGSIL): Secondary | ICD-10-CM

## 2019-10-07 NOTE — Patient Instructions (Signed)

## 2019-10-07 NOTE — Progress Notes (Signed)
Referring Provider:  [facility]  HPI:  Rebecca Walton is a 29 y.o.  G1P1001  who presents today for evaluation and management of abnormal cervical cytology.    Dysplasia History:  LGSIL recent PAP Has 2 yr h/o low grade abn PAPs per pt  ROS:  Pertinent items are noted in HPI.  OB History  Gravida Para Term Preterm AB Living  1 1 1     1   SAB TAB Ectopic Multiple Live Births          1    # Outcome Date GA Lbr Len/2nd Weight Sex Delivery Anes PTL Lv  1 Term 07/01/17 [redacted]w[redacted]d  7 lb 14 oz (3.572 kg) F CS-LTranv  N LIV    Past Medical History:  Diagnosis Date  . History of methicillin resistant staphylococcus aureus (MRSA)    as a child on elbow  . PONV (postoperative nausea and vomiting)     Past Surgical History:  Procedure Laterality Date  . CESAREAN SECTION N/A 06/29/2017   Procedure: CESAREAN SECTION;  Surgeon: 07/01/2017, MD;  Location: [facility] ORS;  Service: Obstetrics;  Laterality: N/A;  . CHOLECYSTECTOMY N/A 08/22/2017   Procedure: LAPAROSCOPIC CHOLECYSTECTOMY;  Surgeon: 10/20/2017, MD;  Location: [facility] ORS;  Service: General;  Laterality: N/A;    SOCIAL HISTORY: Social History   Substance and Sexual Activity  Alcohol Use Yes   Social History   Substance and Sexual Activity  Drug Use No     Family History  Problem Relation Age of Onset  . Anemia Mother   . Asthma Sister   . Anemia Sister   . Epilepsy Maternal Aunt   . Diabetes Maternal Grandfather   . Diabetes Paternal Grandmother     ALLERGIES:  Coconut flavor  Current Outpatient Medications on File Prior to Visit  Medication Sig Dispense Refill  . ferrous sulfate 324 MG TBEC Take 324 mg by mouth daily. Pt unsure of mg takes 1 tablet daily    . norelgestromin-ethinyl estradiol (ORTHO EVRA) 150-35 MCG/24HR transdermal patch Place 1 patch onto the skin once a week. (Patient not taking: Reported on 05/15/2018) 3 patch 12  . Norgestimate-Ethinyl Estradiol Triphasic (TRI-SPRINTEC) 0.18/0.215/0.25  MG-35 MCG tablet Take 1 tablet by mouth daily. (Patient not taking: Reported on 10/07/2019) 1 Package 12  . oxyCODONE-acetaminophen (PERCOCET/ROXICET) 5-325 MG tablet Take 1-2 tablets by mouth every 4 (four) hours as needed for severe pain. (Patient not taking: Reported on 05/15/2018) 30 tablet 0   No current facility-administered medications on file prior to visit.    Physical Exam: -Vitals:  BP 120/80   Ht 5' (1.524 m)   Wt 227 lb (103 kg)   LMP 09/30/2019   BMI 44.33 kg/m  GEN: WD, WN, NAD.  A+ O x 3, good mood and affect. ABD:  NT, ND.  Soft, no masses.  No hernias noted.   Pelvic:   Vulva: Normal appearance.  No lesions.  Vagina: No lesions or abnormalities noted.  Support: Normal pelvic support.  Urethra No masses tenderness or scarring.  Meatus Normal size without lesions or prolapse.  Cervix: See below.  Anus: Normal exam.  No lesions.  Perineum: Normal exam.  No lesions.        Bimanual   Uterus: Normal size.  Non-tender.  Mobile.  AV.  Adnexae: No masses.  Non-tender to palpation.  Cul-de-sac: Negative for abnormality.   PROCEDURE: Colposcopy performed with 4% acetic acid after verbal consent obtained                                         -  Aceto-white Lesions Location(s): none.              -Biopsy performed at 6, 12 o'clock               -ECC indicated and performed: Yes.       -Biopsy sites made hemostatic with pressure, AgNO3, and/or Monsel's solution   -Satisfactory colposcopy: Yes.      -Evidence of Invasive cervical CA :  NO  ASSESSMENT:  Rebecca Walton is a 29 y.o. G1P1001 here for  1. LGSIL on Pap smear of cervix   .  PLAN: 1.  I discussed the grading system of pap smears and HPV high risk viral types.  We will discuss and base management after colpo results return. 2. Follow up PAP 6 months, vs intervention if high grade dysplasia identified 3. Treatment of persistantly abnormal PAP smears and cervical dysplasia, even mild, is discussed w pt today  in detail, as well as the pros and cons of Cryo and LEEP procedures. Will consider and discuss after results. Consider Cryo due to persistent low grade abnormal PAPs      Barnett Applebaum, MD, [facility], [facility] 10/07/2019  3:17 PM

## 2019-10-09 LAB — SURGICAL PATHOLOGY

## 2019-10-10 ENCOUNTER — Telehealth: Payer: Self-pay | Admitting: Obstetrics & Gynecology

## 2019-10-10 NOTE — Telephone Encounter (Signed)
Patient is returning missed call for results. Please advise  

## 2019-10-10 NOTE — Progress Notes (Signed)
LGSIL PAP, 2 years Recent Colpo, Bx revealed CIN II at [facility] and 12 o'clock biopsy site Rec Cryo, vs LEEP as alternative option LM for her to call back Also messaged Ms Lalla Brothers w BCCCP  Annamarie Major, MD, [facility], [facility] 10/10/2019  8:09 AM

## 2019-10-10 NOTE — Progress Notes (Signed)
I have recommended CRYO an office procedure for CIN 2 if this is something we can do under the BCCCP umbrella.  Thx.

## 2019-10-14 NOTE — Progress Notes (Signed)
Patient ID: Rebecca Walton, female   DOB: Apr 08, 1991, 29 y.o.   MRN: [id number] Attempted to call patient several times, and left messages.  Would like to fill out BCCCP Medicaid application related to cervical biopsy results.  Left message again today to see if patient could come in tomorrow afternoon.

## 2019-10-15 ENCOUNTER — Telehealth: Payer: Self-pay | Admitting: Obstetrics & Gynecology

## 2019-10-15 NOTE — Telephone Encounter (Signed)
Contacted pt and she said she recd message from [employer]. I adv the reason for her calling and asked her to give her a call back.

## 2019-10-15 NOTE — Telephone Encounter (Signed)
-----   Message from Lewis Moccasin sent at 10/15/2019 10:01 AM EDT ----- Regarding: FW: B[facility] Medicaid Will you try to reach this patient please? ----- Message ----- From: Scarlett Presto, RN Sent: 10/15/2019   9:56 AM EDT To: Nadara Mustard, MD, Lewis Moccasin Subject: BCCCP Medicaid                                 I am unable to get in touch with patient to fill out [facility] Medicaid application. Multiple calls and messages.  If you talk to her could you have her call me at [phone] so we can set that up? Thurston Hole

## 2019-10-16 ENCOUNTER — Encounter: Payer: Self-pay | Admitting: Family Medicine

## 2019-10-16 DIAGNOSIS — N871 Moderate cervical dysplasia: Secondary | ICD-10-CM

## 2019-10-16 HISTORY — DX: Moderate cervical dysplasia: N87.1

## 2019-10-25 ENCOUNTER — Telehealth: Payer: Self-pay | Admitting: Obstetrics & Gynecology

## 2019-10-25 NOTE — Telephone Encounter (Signed)
-----   Message from Scarlett Presto, RN sent at 10/25/2019  9:58 AM EDT ----- Regarding: Appointment- [facility] Medicaid Hi Huntley Dec, I was completing this chart, and I didn't see an appointment for her cryosurgery.  Her Medicaid has been approved for 6 months. Thank you!  Thurston Hole

## 2019-10-25 NOTE — Telephone Encounter (Signed)
Attempt to reach patient to schedule appointment. Patient's phone number on file isn't accepting calls at this time

## 2019-10-25 NOTE — Progress Notes (Signed)
[facility] approved for 6 months.  Approval emailed to [place].  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 Copy to [facility].

## 2019-10-28 NOTE — Telephone Encounter (Signed)
Called and left voice mail for patient to call back to be schedule °

## 2019-10-28 NOTE — Telephone Encounter (Signed)
Patient is schedule for  11/13/19 with [facility]

## 2019-11-13 ENCOUNTER — Ambulatory Visit (INDEPENDENT_AMBULATORY_CARE_PROVIDER_SITE_OTHER): Payer: Medicaid Other | Admitting: Obstetrics & Gynecology

## 2019-11-13 ENCOUNTER — Encounter: Payer: Self-pay | Admitting: Obstetrics & Gynecology

## 2019-11-13 ENCOUNTER — Other Ambulatory Visit: Payer: Self-pay

## 2019-11-13 VITALS — BP 120/80 | Ht 60.0 in | Wt 230.0 lb

## 2019-11-13 DIAGNOSIS — N871 Moderate cervical dysplasia: Secondary | ICD-10-CM | POA: Diagnosis not present

## 2019-11-13 NOTE — Progress Notes (Signed)
   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 Recent Colpo and biopsy revealed: A. ENDOCERVIX, CURETTAGE:  - High grade squamous intraepithelial lesion, CIN-II (moderate  dysplasia).   B. CERVIX, 6 O'CLOCK, BIOPSY:  - Benign endocervical glandular and squamous mucosa.   C. CERVIX, 12 O'CLOCK, BIOPSY:  - High grade squamous intraepithelial lesion, CIN-II (moderate  dysplasia).   The indications for cryotherapy were reviewed with the patient in detail. She was counseled about that efficacy of this procedure, and possible need for excisional procedure in the future if her cervical dysplasia persists.  The risks of the procedure where explained in detail and patient was told to expect a copious amount of discharge in the next few weeks. All her questions were answered, and written informed consent was obtained.  The patient was placed in the dorsal lithotomy position and a vaginal speculum was placed. Her cervix was visualized and patient was noted to have had normal size transformation zone. The appropriate cryotherapy probes were picked and the more endocervical probe was then affixed to cryotherapy apparatus. Then, nitrogen gas was then activated, the probe was applied to the transformation zone of the cervix. This was kept in place for 2 minutes. The cryotherapy was then stopped and all instruments were removed from the patient's pelvis; a thawing period of 2 minutes was observed.  A second cycle of cryotherapy was then administered to the cervix with the more ectocervical probe for 2 minutes.  Then, the probe was removed.  The patient tolerated the procedure well without any complications. Routine post procedure instructions were given to the patient.  Will repeat pap smear in 6 months and manage accordingly.  Annamarie Major,  MD, [facility], [facility] 11/13/2019  1:24 PM

## 2019-11-13 NOTE — Patient Instructions (Signed)
Cryoablation Cryoablation is a procedure used to remove abnormal growths or cancerous tissue. This is done by freezing the growth or tissue with either liquid nitrogen or argon gas. This procedure may be done to treat many conditions, including:  Skin tumors.  Non-cancerous (benign) nodules.  A type of eye cancer (retinoblastoma).  Cancers of the prostate, liver, kidney, cervix, lung, and bone. Tell a health care provider about:  Any allergies you have.  All medicines you are taking, including vitamins, herbs, eye drops, creams, and over-the-counter medicines.  Any problems you or family members have had with anesthetic medicines.  Any blood disorders you have.  Any surgeries you have had.  Any medical conditions you have.  Whether you are pregnant or may be pregnant. What are the risks? Generally, this is a safe procedure. However, problems may occur, including:  Infection.  Bleeding.  Swelling.  Nerve damage and loss of feeling. This is rare.  Allergic reactions to medicines.  Damage to other structures or organs. What happens before the procedure? Staying hydrated Follow instructions from your health care provider about hydration, which may include:  Up to 2 hours before the procedure - you may continue to drink clear liquids, such as water, clear fruit juice, black coffee, and plain tea. Eating and drinking restrictions Follow instructions from your health care provider about eating and drinking, which may include:  8 hours before the procedure - stop eating heavy meals or foods such as meat, fried foods, or fatty foods.  6 hours before the procedure - stop eating light meals or foods, such as toast or cereal.  6 hours before the procedure - stop drinking milk or drinks that contain milk.  2 hours before the procedure - stop drinking clear liquids. Medicines  Ask your health care provider about: ? Changing or stopping your regular medicines. This is  especially important if you are taking diabetes medicines or blood thinners. ? Taking medicines such as aspirin and ibuprofen. These medicines can thin your blood. Do not take these medicines before your procedure if your health care provider instructs you not to.  You may be given antibiotic medicine to help prevent infection. General instructions  You may have blood tests.  Plan to have someone take you home from the hospital or clinic.  If you will be going home right after the procedure, plan to have someone with you for 24 hours.  Ask your health care provider how your surgical site will be marked or identified. What happens during the procedure?  To reduce your risk of infection: ? Your health care team will wash or sanitize their hands. ? Your skin will be washed with soap. ? Hair may be removed from the surgical area.  An IV tube will be inserted into one of your veins.  You will be given one or more of the following: ? A medicine to help you relax (sedative). ? A medicine to numb the area (local anesthetic). ? A medicine to make you fall asleep (general anesthetic). ? A medicine that is injected into your spine to numb the area below and slightly above the injection site (spinal anesthetic). ? A medicine that is injected into an area of your body to numb everything below the injection site (regional anesthetic).  Depending on the location of the growth, a small incision may be made. A bronchoscope may be used for a lung tumor, or a laparoscope may be used for an abdominal tumor.  Your health care provider may   use a device (cryoprobe) that has liquid nitrogen or argon gas flowing through it. The cryoprobe will be inserted through the incision to the area where the tumor is located. This may be done with guidance from imaging such as an ultrasound, CT scan, or MRI scan.  Liquid nitrogen or argon gas will be delivered through the cryoprobe to the growth until it is frozen and  destroyed.  The process may be repeated on other areas depending on how many areas need treatment.  The cryoprobe will be removed and pressure will be applied to stop any bleeding.  The incision will be closed with stitches (sutures).  The incision will be covered with a bandage (dressing). The procedure will vary depending on the location of the tumor or nodule. The procedure may also vary among health care providers and hospitals. What happens after the procedure?  Do not drive for 24 hours if you received a sedative.  Your blood pressure, heart rate, breathing rate, and blood oxygen level will be monitored until the medicines you were given have worn off.  You will be given medicine to help with pain, nausea, and vomiting as needed. This information is not intended to replace advice given to you by your health care provider. Make sure you discuss any questions you have with your health care provider. Document Revised: 06/16/2017 Document Reviewed: 12/02/2015 Elsevier Patient Education  2020 Elsevier Inc.  

## 2019-12-23 ENCOUNTER — Encounter: Payer: Self-pay | Admitting: Physician Assistant

## 2019-12-23 NOTE — Progress Notes (Signed)
Reviewed note by Dr. Tiburcio Pea from [facility] re:  Patient with CIN II on colpo and that patient was treated with cryotherapy on 11/13/2019.  Patient to follow up for repeat pap in October 2021.

## 2020-05-15 ENCOUNTER — Other Ambulatory Visit (HOSPITAL_COMMUNITY)
Admission: RE | Admit: 2020-05-15 | Discharge: 2020-05-15 | Disposition: A | Discharge status: Home / Self Care | Payer: Medicaid Other | Source: Ambulatory Visit | Attending: Obstetrics & Gynecology | Admitting: Obstetrics & Gynecology

## 2020-05-15 ENCOUNTER — Ambulatory Visit (INDEPENDENT_AMBULATORY_CARE_PROVIDER_SITE_OTHER): Payer: Medicaid Other | Admitting: Obstetrics & Gynecology

## 2020-05-15 ENCOUNTER — Encounter: Payer: Self-pay | Admitting: Obstetrics & Gynecology

## 2020-05-15 ENCOUNTER — Other Ambulatory Visit: Payer: Self-pay

## 2020-05-15 VITALS — BP 120/80 | Ht 60.0 in | Wt 223.0 lb

## 2020-05-15 DIAGNOSIS — N871 Moderate cervical dysplasia: Secondary | ICD-10-CM

## 2020-05-15 NOTE — Progress Notes (Signed)
HPI:  Patient is a 29 y.o. G1P1001 presenting for follow up evaluation of abnormal PAP smear in the past.  Her last PAP was 6 months ago and approximate date 10/2019 and was abnormal: LGSIL. She has had a prior colposcopy. Prior biopsies (if done) were CIN 2.  She then had Cryo to cervix last April.  Tolerated procedure well, no neg side effects.Marland Kitchen  PMHx: She  has a past medical history of History of methicillin resistant staphylococcus aureus (MRSA) and PONV (postoperative nausea and vomiting). Also,  has a past surgical history that includes Cesarean section (N/A, 06/29/2017) and Cholecystectomy (N/A, 08/22/2017)., family history includes Anemia in her mother and sister; Asthma in her sister; Diabetes in her maternal grandfather and paternal grandmother; Epilepsy in her maternal aunt.,  reports that she has quit smoking. Her smoking use included cigarettes. She has never used smokeless tobacco. She reports current alcohol use. She reports that she does not use drugs.  She has a current medication list which includes the following prescription(s): ferrous sulfate and norgestimate-ethinyl estradiol triphasic. Also, is allergic to coconut flavor.  Review of Systems  All other systems reviewed and are negative.   Objective: BP 120/80   Ht 5' (1.524 m)   Wt 223 lb (101.2 kg)   BMI 43.55 kg/m  Filed Weights   05/15/20 1512  Weight: 223 lb (101.2 kg)   Body mass index is 43.55 kg/m.  Physical examination Physical Exam Constitutional:      General: She is not in acute distress.    Appearance: She is well-developed.  Genitourinary:     Pelvic exam was performed with patient supine.     Vagina and uterus normal.     No vaginal erythema or bleeding.     No cervical motion tenderness, discharge, polyp or nabothian cyst.     Uterus is mobile.     Uterus is not enlarged.     No uterine mass detected.    Uterus is midaxial.     No right or left adnexal mass present.     Right adnexa not  tender.     Left adnexa not tender.  HENT:     Head: Normocephalic and atraumatic.     Nose: Nose normal.  Abdominal:     General: There is no distension.     Palpations: Abdomen is soft.     Tenderness: There is no abdominal tenderness.  Musculoskeletal:        General: Normal range of motion.  Neurological:     Mental Status: She is alert and oriented to person, place, and time.     Cranial Nerves: No cranial nerve deficit.  Skin:    General: Skin is warm and dry.  Psychiatric:        Attention and Perception: Attention normal.        Mood and Affect: Mood and affect normal.        Speech: Speech normal.        Behavior: Behavior normal.        Thought Content: Thought content normal.        Judgment: Judgment normal.     ASSESSMENT:  History of Cervical Dysplasia 1. CIN II (cervical intraepithelial neoplasia II) Prior Cryo to cervix PAP today  Plan: 1.  I discussed the grading system of pap smears and HPV high risk viral types.   2. Follow up PAP 6 months, vs intervention if high grade dysplasia identified.  A total of 20 minutes were  spent face-to-face with the patient as well as preparation, review, communication, and documentation during this encounter.    Annamarie Major, MD, [facility], [facility] 05/15/2020  3:32 PM

## 2020-05-20 LAB — CYTOLOGY - PAP
Adequacy: ABSENT
Diagnosis: NEGATIVE
Diagnosis: REACTIVE

## 2020-05-20 NOTE — Progress Notes (Unsigned)
Per staff message in Epic, Dr. Alvester Morin agrees with Dr. Tiburcio Pea plan of care for 6 month follow-up for CIN II due 10/2020 (see 05/15/2020 office note by Dr. Tiburcio Pea at [facility]). Hart Carwin, RN

## 2020-06-05 ENCOUNTER — Telehealth: Payer: Self-pay | Admitting: Obstetrics & Gynecology

## 2020-06-05 NOTE — Telephone Encounter (Signed)
Left message to advise pt of results and [facility] message

## 2020-06-05 NOTE — Telephone Encounter (Signed)
Pt calling about test results  °

## 2020-06-17 ENCOUNTER — Other Ambulatory Visit: Payer: Self-pay

## 2020-06-17 ENCOUNTER — Encounter: Payer: Self-pay | Admitting: Emergency Medicine

## 2020-06-17 DIAGNOSIS — K529 Noninfective gastroenteritis and colitis, unspecified: Secondary | ICD-10-CM | POA: Diagnosis not present

## 2020-06-17 DIAGNOSIS — R197 Diarrhea, unspecified: Secondary | ICD-10-CM | POA: Diagnosis present

## 2020-06-17 DIAGNOSIS — Z20822 Contact with and (suspected) exposure to covid-19: Secondary | ICD-10-CM | POA: Insufficient documentation

## 2020-06-17 DIAGNOSIS — Z87891 Personal history of nicotine dependence: Secondary | ICD-10-CM | POA: Insufficient documentation

## 2020-06-17 LAB — CBC
HCT: 41.2 % (ref 36.0–46.0)
Hemoglobin: 12.7 g/dL (ref 12.0–15.0)
MCH: 24.6 pg — ABNORMAL LOW (ref 26.0–34.0)
MCHC: 30.8 g/dL (ref 30.0–36.0)
MCV: 79.7 fL — ABNORMAL LOW (ref 80.0–100.0)
Platelets: 325 10*3/uL (ref 150–400)
RBC: 5.17 MIL/uL — ABNORMAL HIGH (ref 3.87–5.11)
RDW: 14.4 % (ref 11.5–15.5)
WBC: 11.2 10*3/uL — ABNORMAL HIGH (ref 4.0–10.5)
nRBC: 0 % (ref 0.0–0.2)

## 2020-06-17 LAB — COMPREHENSIVE METABOLIC PANEL
ALT: 43 U/L (ref 0–44)
AST: 33 U/L (ref 15–41)
Albumin: 4.2 g/dL (ref 3.5–5.0)
Alkaline Phosphatase: 95 U/L (ref 38–126)
Anion gap: 8 (ref 5–15)
BUN: 10 mg/dL (ref 6–20)
CO2: 27 mmol/L (ref 22–32)
Calcium: 9.3 mg/dL (ref 8.9–10.3)
Chloride: 104 mmol/L (ref 98–111)
Creatinine, Ser: 0.6 mg/dL (ref 0.44–1.00)
GFR, Estimated: >60 mL/min (ref >60)
Glucose, Bld: 92 mg/dL (ref 70–99)
Potassium: 4 mmol/L (ref 3.5–5.1)
Sodium: 139 mmol/L (ref 135–145)
Total Bilirubin: 0.6 mg/dL (ref 0.3–1.2)
Total Protein: 7.9 g/dL (ref 6.5–8.1)

## 2020-06-17 LAB — URINALYSIS, COMPLETE (UACMP) WITH MICROSCOPIC
Bacteria, UA: NONE SEEN
Bilirubin Urine: NEGATIVE
Glucose, UA: NEGATIVE mg/dL
Hgb urine dipstick: NEGATIVE
Ketones, ur: NEGATIVE mg/dL
Leukocytes,Ua: NEGATIVE
Nitrite: NEGATIVE
Protein, ur: NEGATIVE mg/dL
Specific Gravity, Urine: 1.028 (ref 1.005–1.030)
pH: 5 (ref 5.0–8.0)

## 2020-06-17 LAB — LIPASE, BLOOD: Lipase: 25 U/L (ref 11–51)

## 2020-06-17 LAB — POC URINE PREG, ED: Preg Test, Ur: NEGATIVE

## 2020-06-17 NOTE — ED Triage Notes (Signed)
Pt to ED from home c/o nausea and diarrhea x2 days, lower abd pain.  Denies urinary changes.  Pt A&Ox4, chest rise even and unlabored, in NAD at this time.

## 2020-06-18 ENCOUNTER — Emergency Department
Admission: EM | Admit: 2020-06-18 | Discharge: 2020-06-18 | Disposition: A | Discharge status: Home / Self Care | Payer: Medicaid Other | Attending: Emergency Medicine | Admitting: Emergency Medicine

## 2020-06-18 DIAGNOSIS — K529 Noninfective gastroenteritis and colitis, unspecified: Secondary | ICD-10-CM

## 2020-06-18 LAB — RESP PANEL BY RT-PCR (FLU A&B, COVID) ARPGX2
Influenza A by PCR: NEGATIVE
Influenza B by PCR: NEGATIVE
SARS Coronavirus 2 by RT PCR: NEGATIVE

## 2020-06-18 MED ORDER — ONDANSETRON HCL 4 MG PO TABS
4.0000 mg | ORAL_TABLET | Freq: Once | ORAL | Status: AC
  Administered 2020-06-18: 4 mg via ORAL
  Filled 2020-06-18: qty 1

## 2020-06-18 MED ORDER — ONDANSETRON HCL 4 MG PO TABS: 4.0000 mg | ORAL_TABLET | Freq: Three times a day (TID) | ORAL | 0 refills | Status: DC | PRN

## 2020-06-18 NOTE — ED Provider Notes (Signed)
[facility] Emergency Department Provider Note  ____________________________________________   First MD Initiated Contact with Patient 06/18/20 0012     (approximate)  I have reviewed the triage vital signs and the nursing notes.   HISTORY  Chief Complaint Nausea and Diarrhea   HPI Rebecca Walton is a 29 y.o. female without significant past medical history who presents for assessment of 2 to 3 days of some nausea and nonbloody diarrhea.  She states her daughter also has very similar symptoms at this time.  She denies any vomiting, abdominal pain, chest pain, cough, shortness of breath, fever, chills, headache, earache, urinary symptoms, rash or extremity pain.  No alleviating aggravating factors.  No other acute concerns at this time.  Patient does not use EtOH or illicit drugs.  She denies tobacco abuse.         Past Medical History:  Diagnosis Date  . History of methicillin resistant staphylococcus aureus (MRSA)    as a child on elbow  . PONV (postoperative nausea and vomiting)     Patient Active Problem List   Diagnosis Date Noted  . CIN II (cervical intraepithelial neoplasia II) 10/16/2019  . 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 Asthma 03/27/2018  . Morbid obesity (HCC) 03/27/2018  . Calculus of gallbladder without cholecystitis without obstruction 08/07/2017  . Postpartum anemia 07/03/2017    Past Surgical History:  Procedure Laterality Date  . CESAREAN SECTION N/A 06/29/2017   Procedure: CESAREAN SECTION;  Surgeon: Conard Novak, MD;  Location: [facility] ORS;  Service: Obstetrics;  Laterality: N/A;  . CHOLECYSTECTOMY N/A 08/22/2017   Procedure: LAPAROSCOPIC CHOLECYSTECTOMY;  Surgeon: Ancil Linsey, MD;  Location: [facility] ORS;  Service: General;  Laterality: N/A;    Prior to Admission medications   Medication Sig Start Date End Date Taking? Authorizing Provider  ferrous sulfate 324 MG  TBEC Take 324 mg by mouth daily. Pt unsure of mg takes 1 tablet daily    [provider]  Norgestimate-Ethinyl Estradiol Triphasic (TRI-SPRINTEC) 0.18/0.215/0.25 MG-35 MCG tablet Take 1 tablet by mouth daily. 05/22/19   Larene Pickett, FNP  ondansetron (ZOFRAN) 4 MG tablet Take 1 tablet (4 mg total) by mouth every 8 (eight) hours as needed for up to 10 doses for nausea or vomiting. 06/18/20   Gilles Chiquito, MD    Allergies Coconut flavor  Family History  Problem Relation Age of Onset  . Anemia Mother   . Asthma Sister   . Anemia Sister   . Epilepsy Maternal Aunt   . Diabetes Maternal Grandfather   . Diabetes Paternal Grandmother     Social History Social History   Tobacco Use  . Smoking status: Former Smoker    Types: Cigarettes  . Smokeless tobacco: Never Used  Vaping Use  . Vaping Use: Never used  Substance Use Topics  . Alcohol use: Yes  . Drug use: No    Review of Systems  Review of Systems  Constitutional: Negative for chills and fever.  HENT: Negative for sore throat.   Eyes: Negative for pain.  Respiratory: Negative for cough and stridor.   Cardiovascular: Negative for chest pain.  Gastrointestinal: Positive for diarrhea and nausea. Negative for vomiting.  Genitourinary: Negative for dysuria.  Musculoskeletal: Negative for myalgias.  Skin: Negative for rash.  Neurological: Negative for seizures, loss of consciousness and headaches.  Psychiatric/Behavioral: Negative for suicidal ideas.  All other systems reviewed and are negative.  ____________________________________________   PHYSICAL EXAM:  VITAL SIGNS: ED Triage Vitals [06/17/20 1907]  Enc Vitals Group     BP (!) 144/85     Pulse Rate 86     Resp 15     Temp 98.7 F (37.1 C)     Temp Source Oral     SpO2 100 %     Weight 230 lb (104.3 kg)     Height 5' (1.524 m)     Head Circumference      Peak Flow      Pain Score 2     Pain Loc      Pain Edu?      Excl. in GC?     Vitals:   06/17/20 1907  BP: (!) 144/85  Pulse: 86  Resp: 15  Temp: 98.7 F (37.1 C)  SpO2: 100%   Physical Exam Vitals and nursing note reviewed.  Constitutional:      General: She is not in acute distress.    Appearance: She is well-developed.  HENT:     Head: Normocephalic and atraumatic.     Right Ear: External ear normal.     Left Ear: External ear normal.     Nose: Nose normal.  Eyes:     Conjunctiva/sclera: Conjunctivae normal.  Cardiovascular:     Rate and Rhythm: Normal rate and regular rhythm.     Pulses: Normal pulses.     Heart sounds: No murmur heard.   Pulmonary:     Effort: Pulmonary effort is normal. No respiratory distress.     Breath sounds: Normal breath sounds.  Abdominal:     Palpations: Abdomen is soft.     Tenderness: There is no abdominal tenderness.  Musculoskeletal:     Cervical back: Neck supple.     Right lower leg: No edema.     Left lower leg: No edema.  Skin:    General: Skin is warm and dry.     Capillary Refill: Capillary refill takes less than 2 seconds.  Neurological:     Mental Status: She is alert and oriented to person, place, and time.  Psychiatric:        Mood and Affect: Mood normal.      ____________________________________________   LABS (all labs ordered are listed, but only abnormal results are displayed)  Labs Reviewed  CBC - Abnormal; Notable for the following components:      Result Value   WBC 11.2 (*)    RBC 5.17 (*)    MCV 79.7 (*)    MCH 24.6 (*)    All other components within normal limits  URINALYSIS, COMPLETE (UACMP) WITH MICROSCOPIC - Abnormal; Notable for the following components:   Color, Urine YELLOW (*)    APPearance HAZY (*)    All other components within normal limits  RESP PANEL BY RT-PCR (FLU A&B, COVID) ARPGX2  LIPASE, BLOOD  COMPREHENSIVE METABOLIC PANEL  POC URINE PREG, ED    ____________________________________________  ____________________________________________   PROCEDURES  Procedure(s) performed (including Critical Care):  Procedures   ____________________________________________   INITIAL IMPRESSION / ASSESSMENT AND PLAN / ED COURSE        Patient presents with [place] to history exam for assessment of some nausea and diarrhea over the last 2 days.  Her daughter is at home with very similar symptoms.  She is afebrile hemodynamically stable arrival.  I suspect this is likely some infectious enteritis.  Her abdomen is soft and nontender throughout she has no vomiting.  Very low suspicion for SBO, perforated viscus, appendicitis, diverticulitis or acute cholecystitis at this time.  Lipase of 25 not consistent with acute pancreatitis.  CMP shows no significant ocular metabolic derangements and there is no evidence of cholestasis.  CBC shows mild leukocytosis with WBC count 11.2 and otherwise no significant derangements.  UA does not appear infected.  Urine pregnancy test is negative  Given stable vital signs with otherwise reassuring exam work-up and clear etiology for symptoms believe patient safe for discharge plan for close outpatient follow-up.  She was given 1 dose of Zofran in the ED and Rx was written for a couple days of Zofran.  Instructed follow-up with PCP.  Discharged stable condition per strict precautions advised discussed.  Well CR work-up and was also sent for discharge.  Strict follow-up   ____________________________________________   FINAL CLINICAL IMPRESSION(S) / ED DIAGNOSES  Final diagnoses:  Enteritis    Medications  ondansetron (ZOFRAN) tablet 4 mg (4 mg Oral Given 06/18/20 0030)     ED Discharge Orders         Ordered    ondansetron (ZOFRAN) 4 MG tablet  Every 8 hours PRN        06/18/20 0017           Note:  This document was prepared using Dragon voice recognition software and may include unintentional dictation  errors.   Gilles Chiquito, MD 06/18/20 [phone]

## 2020-07-22 ENCOUNTER — Other Ambulatory Visit: Payer: Self-pay

## 2020-11-12 ENCOUNTER — Other Ambulatory Visit: Payer: Self-pay

## 2020-11-12 ENCOUNTER — Ambulatory Visit (LOCAL_COMMUNITY_HEALTH_CENTER): Payer: Medicaid Other

## 2020-11-12 VITALS — BP 113/75 | Ht 60.0 in | Wt 224.5 lb

## 2020-11-12 DIAGNOSIS — Z3201 Encounter for pregnancy test, result positive: Secondary | ICD-10-CM

## 2020-11-12 LAB — PREGNANCY, URINE: Preg Test, Ur: POSITIVE — AB

## 2020-11-12 MED ORDER — PRENATAL 27-0.8 MG PO TABS: 1.0000 | ORAL_TABLET | Freq: Every day | ORAL | 0 refills | Status: AC

## 2020-11-12 NOTE — Progress Notes (Signed)
UPT positive. Plans prenatal care at [facility]. To DSS for Medicaid/PW. Jerel Shepherd, RN

## 2020-11-20 ENCOUNTER — Emergency Department: Payer: Medicaid Other

## 2020-11-20 ENCOUNTER — Other Ambulatory Visit: Payer: Self-pay

## 2020-11-20 ENCOUNTER — Emergency Department
Admission: EM | Admit: 2020-11-20 | Discharge: 2020-11-21 | Disposition: A | Discharge status: Home / Self Care | Payer: Medicaid Other | Attending: Emergency Medicine | Admitting: Emergency Medicine

## 2020-11-20 ENCOUNTER — Telehealth: Payer: Self-pay | Admitting: Nurse Practitioner

## 2020-11-20 DIAGNOSIS — R1030 Lower abdominal pain, unspecified: Secondary | ICD-10-CM | POA: Insufficient documentation

## 2020-11-20 DIAGNOSIS — O209 Hemorrhage in early pregnancy, unspecified: Secondary | ICD-10-CM | POA: Diagnosis present

## 2020-11-20 DIAGNOSIS — Z3A01 Less than 8 weeks gestation of pregnancy: Secondary | ICD-10-CM | POA: Diagnosis not present

## 2020-11-20 DIAGNOSIS — Z87891 Personal history of nicotine dependence: Secondary | ICD-10-CM | POA: Diagnosis not present

## 2020-11-20 DIAGNOSIS — O99891 Other specified diseases and conditions complicating pregnancy: Secondary | ICD-10-CM | POA: Diagnosis not present

## 2020-11-20 DIAGNOSIS — O4690 Antepartum hemorrhage, unspecified, unspecified trimester: Secondary | ICD-10-CM

## 2020-11-20 LAB — CBC WITH DIFFERENTIAL/PLATELET
Abs Immature Granulocytes: 0.04 10*3/uL (ref 0.00–0.07)
Basophils Absolute: 0 10*3/uL (ref 0.0–0.1)
Basophils Relative: 0 %
Eosinophils Absolute: 0.1 10*3/uL (ref 0.0–0.5)
Eosinophils Relative: 1 %
HCT: 37.5 % (ref 36.0–46.0)
Hemoglobin: 11.6 g/dL — ABNORMAL LOW (ref 12.0–15.0)
Immature Granulocytes: 0 %
Lymphocytes Relative: 30 %
Lymphs Abs: 3.8 10*3/uL (ref 0.7–4.0)
MCH: 24.1 pg — ABNORMAL LOW (ref 26.0–34.0)
MCHC: 30.9 g/dL (ref 30.0–36.0)
MCV: 77.8 fL — ABNORMAL LOW (ref 80.0–100.0)
Monocytes Absolute: 0.6 10*3/uL (ref 0.1–1.0)
Monocytes Relative: 5 %
Neutro Abs: 8 10*3/uL — ABNORMAL HIGH (ref 1.7–7.7)
Neutrophils Relative %: 64 %
Platelets: 295 10*3/uL (ref 150–400)
RBC: 4.82 MIL/uL (ref 3.87–5.11)
RDW: 15.4 % (ref 11.5–15.5)
WBC: 12.6 10*3/uL — ABNORMAL HIGH (ref 4.0–10.5)
nRBC: 0 % (ref 0.0–0.2)

## 2020-11-20 LAB — HCG, QUANTITATIVE, PREGNANCY: hCG, Beta Chain, Quant, S: 454 m[IU]/mL — ABNORMAL HIGH (ref <5)

## 2020-11-20 LAB — ABO/RH: ABO/RH(D): A POS

## 2020-11-20 NOTE — Telephone Encounter (Signed)
Telephone call to patient regarding PAP follow up.  Patient states that she recently found out that she was pregnant.  She also states that she has an appointment with [facility] for an initial prenatal appointment.  Patient is to ask for a PAP during that appointment.  Will follow up. Glenna Fellows, RN

## 2020-11-20 NOTE — ED Triage Notes (Signed)
Pt presents with a cc of vaginal bleeding that began this morning and has progressively gotten heavier throughout today. Pt is [redacted] weeks pregnant. Pt reports lower abdominal cramping and bright red bleeding without clots. Denies urinary symptoms/abnormal vaginal discharge. Nausea present earlier today.

## 2020-11-20 NOTE — ED Provider Notes (Signed)
[facility] Emergency Department Provider Note  ____________________________________________   Event Date/Time   First MD Initiated Contact with Patient 11/20/20 2306     (approximate)  I have reviewed the triage vital signs and the nursing notes.   HISTORY  Chief Complaint Vaginal Bleeding   HPI Rebecca Walton is a 30 y.o. female G2, P1 approximately 6 weeks by last normal.  With a history of anemia currently on iron who presents for assessment of brief episode of some lower abdominal cramping associate with little vaginal bleeding earlier today.  Patient states people at the less than 1 pad and does not currently have any pain.  She states [facility].  She states that otherwise she has not had any similar issues and denies any associated sick symptoms including nausea, vomiting, diarrhea, dysuria, other abnormal discharge or recent bleeding, back pain, chest pain, cough, shortness of breath, headache, earache, sore throat, rash or recent injuries or falls.  States she is only on iron and prenatal vitamins.  No other acute concerns at this time         Past Medical History:  Diagnosis Date  . Anemia   . History of methicillin resistant staphylococcus aureus (MRSA)    as a child on elbow  . PONV (postoperative nausea and vomiting)   . Vaginal Pap smear, abnormal    cryo 2021    Patient Active Problem List   Diagnosis Date Noted  . CIN II (cervical intraepithelial neoplasia II) 10/16/2019  . 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 Asthma 03/27/2018  . Morbid obesity (HCC) 03/27/2018  . Calculus of gallbladder without cholecystitis without obstruction 08/07/2017  . Postpartum anemia 07/03/2017    Past Surgical History:  Procedure Laterality Date  . CESAREAN SECTION N/A 06/29/2017   Procedure: CESAREAN SECTION;  Surgeon: Conard NovakJackson, Stephen D, MD;  Location: [facility] ORS;   Service: Obstetrics;  Laterality: N/A;  . CHOLECYSTECTOMY N/A 08/22/2017   Procedure: LAPAROSCOPIC CHOLECYSTECTOMY;  Surgeon: Ancil Linseyavis, Jason Evan, MD;  Location: [facility] ORS;  Service: General;  Laterality: N/A;    Prior to Admission medications   Medication Sig Start Date End Date Taking? Authorizing Provider  ferrous sulfate 324 MG TBEC Take 324 mg by mouth daily. Pt unsure of mg takes 1 tablet daily    [provider]  Norgestimate-Ethinyl Estradiol Triphasic (TRI-SPRINTEC) 0.18/0.215/0.25 MG-35 MCG tablet Take 1 tablet by mouth daily. Patient not taking: Reported on 11/12/2020 05/22/19   Larene PickettLatta, Cynthia, FNP  ondansetron (ZOFRAN) 4 MG tablet Take 1 tablet (4 mg total) by mouth every 8 (eight) hours as needed for up to 10 doses for nausea or vomiting. Patient not taking: Reported on 11/12/2020 06/18/20   Gilles ChiquitoSmith, Peony Barner P, MD  Prenatal Vit-Fe Fumarate-FA (MULTIVITAMIN-PRENATAL) 27-0.8 MG TABS tablet Take 1 tablet by mouth daily at 12 noon. 11/12/20 02/20/21  Federico FlakeNewton, Kimberly Niles, MD    Allergies Coconut flavor  Family History  Problem Relation Age of Onset  . Anemia Mother   . Asthma Sister   . Anemia Sister   . Epilepsy Maternal Aunt   . Diabetes Maternal Grandfather   . Diabetes Paternal Grandmother     Social History Social History   Tobacco Use  . Smoking status: Former Smoker    Types: Cigarettes    Quit date: 2011    Years since quitting: 11.3  . Smokeless tobacco: Never Used  Vaping Use  . Vaping  Use: Never used  Substance Use Topics  . Alcohol use: Not Currently    Comment: last use- 11/07/20- one mixed drink  . Drug use: No    Review of Systems  Review of Systems  Constitutional: Negative for chills and fever.  HENT: Negative for sore throat.   Eyes: Negative for pain.  Respiratory: Negative for cough and stridor.   Cardiovascular: Negative for chest pain.  Gastrointestinal: Positive for abdominal pain ( resolved prior to arrival). Negative for vomiting.   Genitourinary: Negative for dysuria.  Musculoskeletal: Negative for myalgias.  Skin: Negative for rash.  Neurological: Negative for seizures, loss of consciousness and headaches.  Psychiatric/Behavioral: Negative for suicidal ideas.  All other systems reviewed and are negative.     ____________________________________________   PHYSICAL EXAM:  VITAL SIGNS: ED Triage Vitals  Enc Vitals Group     BP 11/20/20 2251 132/67     Pulse Rate 11/20/20 2251 98     Resp 11/20/20 2251 20     Temp 11/20/20 2251 98.5 F (36.9 C)     Temp Source 11/20/20 2251 Oral     SpO2 11/20/20 2251 100 %     Weight 11/20/20 2253 227 lb (103 kg)     Height 11/20/20 2253 5' (1.524 m)     Head Circumference --      Peak Flow --      Pain Score 11/20/20 2252 5     Pain Loc --      Pain Edu? --      Excl. in GC? --    Vitals:   11/20/20 2251 11/21/20 0145  BP: 132/67 101/73  Pulse: 98 (!) 101  Resp: 20 18  Temp: 98.5 F (36.9 C)   SpO2: 100% 98%   Physical Exam Vitals and nursing note reviewed.  Constitutional:      General: She is not in acute distress.    Appearance: She is well-developed.  HENT:     Head: Normocephalic and atraumatic.     Right Ear: External ear normal.     Left Ear: External ear normal.     Nose: Nose normal.  Eyes:     Conjunctiva/sclera: Conjunctivae normal.  Cardiovascular:     Rate and Rhythm: Normal rate and regular rhythm.     Heart sounds: No murmur heard.   Pulmonary:     Effort: Pulmonary effort is normal. No respiratory distress.     Breath sounds: Normal breath sounds.  Abdominal:     Palpations: Abdomen is soft.     Tenderness: There is no abdominal tenderness.  Musculoskeletal:     Cervical back: Neck supple.     Right lower leg: No edema.     Left lower leg: No edema.  Skin:    General: Skin is warm and dry.     Capillary Refill: Capillary refill takes less than 2 seconds.  Neurological:     Mental Status: She is alert and oriented to  person, place, and time.  Psychiatric:        Mood and Affect: Mood normal.     ____________________________________________   LABS (all labs ordered are listed, but only abnormal results are displayed)  Labs Reviewed  CBC WITH DIFFERENTIAL/PLATELET - Abnormal; Notable for the following components:      Result Value   WBC 12.6 (*)    Hemoglobin 11.6 (*)    MCV 77.8 (*)    MCH 24.1 (*)    Neutro Abs 8.0 (*)  All other components within normal limits  HCG, QUANTITATIVE, PREGNANCY - Abnormal; Notable for the following components:   hCG, Beta Chain, Quant, S 454 (*)    All other components within normal limits  BASIC METABOLIC PANEL  PROTIME-INR  ABO/RH   ____________________________________________  EKG  ____________________________________________  RADIOLOGY  ED MD interpretation: No clearly visualized IUP or other acute abdominopelvic process.  Official radiology report(s): US OB LESS THAN 14 WEEKS WITH OB TRANSVAGINAL  Result Date: 11/21/2020 CLINICAL DATA:  Last menstrual period 10/05/2020, gestational age by last menstrual period 6 weeks and 5 days. Estimated due date by last menstrual period 07/12/2021. Quantitative beta HCG 454. Presenting with vaginal bleeding. EXAM: OBSTETRIC <14 WK [place] AND TRANSVAGINAL OB US TECHNIQUE: Both transabdominal and transvaginal ultrasound examinations were performed for complete evaluation of the gestation as well as the maternal uterus, adnexal regions, and pelvic cul-de-sac. Transvaginal technique was performed to assess early pregnancy. COMPARISON:  None. FINDINGS: Intrauterine gestational sac: None Yolk sac:  Not Visualized. Embryo:  Not Visualized. Cardiac Activity: Not Visualized. Subchorionic hemorrhage:  None visualized. Maternal uterus/adnexae: The endometrium measures up to 16 mm. Endometrial fluid with movement noted within the endometrial canal. The uterus is otherwise unremarkable. Bilateral ovaries are unremarkable. IMPRESSION:  No intrauterine gestational sac, yolk sac, fetal pole, or cardiac activity yet visualized. Given endometrial canal movement of fluid in the setting of a positive quantitative beta HCG findings are suspicious but not yet definitive for failed pregnancy. Differential diagnosis includes a nonvisualized ectopic pregnancy. Recommend trending of quantitative B-HCG levels and follow-up US in 14 days to assess for pregnancy. This recommendation follows SRU consensus guidelines: Diagnostic Criteria for Nonviable Pregnancy Early in the First Trimester. Malva Limes Med 2013; 127:5170-01. Electronically Signed   By: Tish Frederickson M.D.   On: 11/21/2020 00:56    ____________________________________________   PROCEDURES  Procedure(s) performed (including Critical Care):  Procedures   ____________________________________________   INITIAL IMPRESSION / ASSESSMENT AND PLAN / ED COURSE      Patient presents with above-stated history exam for assessment of an episode of bleeding she had earlier today and brief pelvic cramps both resolved prior to arrival in the setting of being approximately [redacted] weeks pregnant.  On arrival she is afebrile hemodynamically stable.  Her abdomen is soft nontender throughout she states she is no longer bleeding.  She has not any blood thinners has known coagulopathy and has not had any recent trauma.  Ultrasound obtained shows no deformed IUP or ectopic but hCG is 454 and likely below threshold where an IUP would be visualized.  Findings are concerning for likely failed pregnancy although it is certainly possible that it is very early in her gestation as well.  Advised patient of these findings and recommendation for repeat hCG and ultrasound in 10 to 14 days to be coordinated by her OB.  CBC has a WBC count of 12.6 which is some nonspecific she has no other infectious symptoms no suspicion for acute infectious process.  Hemoglobin is 11.6 and she has no symptoms of anemia at this time  this medication for transfusion.  Rh is positive.  BMP shows no significant electrolyte or metabolic derangements.  Given she has not had any recurrence of bleeding is not any pain with stable vitals I think she is safe for discharge with OB follow-up.  Discharged stable condition.  Strict return cautions advised and discussed.        ____________________________________________   FINAL CLINICAL IMPRESSION(S) / ED DIAGNOSES  Final diagnoses:  First trimester bleeding    Medications - No data to display   ED Discharge Orders    None       Note:  This document was prepared using Dragon voice recognition software and may include unintentional dictation errors.   Gilles Chiquito, MD 11/21/20 [phone]

## 2020-11-20 NOTE — ED Notes (Signed)
Pt ambulatory to ED10 with steady gait. Pt reports vaginal bleeding starting this morning and worsening throughout the day. Positive at home pregnancy test, and positive test at health dept last Friday, states they told her she was [redacted]w[redacted]d. Pt reports pink-colored bleeding then became bright red. Denies blood clots. Has used one pad today for the bleeding. Also endorses some lower abd cramping that started around noon, per pt. She reports she has had one previous pregnancy with no issues. AAO NAD VSS

## 2020-11-21 LAB — BASIC METABOLIC PANEL
Anion gap: 13 (ref 5–15)
BUN: 11 mg/dL (ref 6–20)
CO2: 23 mmol/L (ref 22–32)
Calcium: 9.2 mg/dL (ref 8.9–10.3)
Chloride: 101 mmol/L (ref 98–111)
Creatinine, Ser: 0.62 mg/dL (ref 0.44–1.00)
GFR, Estimated: >60 mL/min (ref >60)
Glucose, Bld: 85 mg/dL (ref 70–99)
Potassium: 3.9 mmol/L (ref 3.5–5.1)
Sodium: 137 mmol/L (ref 135–145)

## 2020-11-21 NOTE — ED Notes (Addendum)
Patient transported to ultrasound via wheelchair.

## 2020-11-21 NOTE — ED Notes (Signed)
Pt sitting on stretcher in NAD. Pt denies pain at this time. Denies needs

## 2020-11-30 ENCOUNTER — Ambulatory Visit (INDEPENDENT_AMBULATORY_CARE_PROVIDER_SITE_OTHER): Payer: Medicaid Other | Admitting: Obstetrics & Gynecology

## 2020-11-30 ENCOUNTER — Encounter: Payer: Medicaid Other | Admitting: Advanced Practice Midwife

## 2020-11-30 ENCOUNTER — Encounter: Payer: Self-pay | Admitting: Obstetrics & Gynecology

## 2020-11-30 ENCOUNTER — Other Ambulatory Visit: Payer: Self-pay

## 2020-11-30 ENCOUNTER — Other Ambulatory Visit (HOSPITAL_COMMUNITY)
Admission: RE | Admit: 2020-11-30 | Discharge: 2020-11-30 | Disposition: A | Discharge status: Home / Self Care | Payer: Medicaid Other | Source: Ambulatory Visit | Attending: Obstetrics & Gynecology | Admitting: Obstetrics & Gynecology

## 2020-11-30 VITALS — BP 120/80 | Ht 60.0 in | Wt 225.0 lb

## 2020-11-30 DIAGNOSIS — N871 Moderate cervical dysplasia: Secondary | ICD-10-CM

## 2020-11-30 DIAGNOSIS — O209 Hemorrhage in early pregnancy, unspecified: Secondary | ICD-10-CM

## 2020-11-30 NOTE — Progress Notes (Signed)
Obstetric Problem Visit   Chief Complaint: First trimester bleeding History of Present Illness: Patient is a 30 y.o. G2P1001 [redacted]w[redacted]d presenting for first trimester bleeding.  The onset of bleeding was last week, seen in ER, and then had bleeding for 6 days, stopped last Thurs.  Is bleeding equal to or greater than normal menstrual flow:  Yes Any recent trauma:  No Recent intercourse:  No History of prior miscarriage:  No Prior ultrasound demonstrating IUP:  No Prior ultrasound demonstrating viable IUP:  No Prior Serum HCG:  Yes  500 on 11/20/20 Rh status: POS Desired pregnancy  PMHx: She  has a past medical history of Anemia, History of methicillin resistant staphylococcus aureus (MRSA), PONV (postoperative nausea and vomiting), and Vaginal Pap smear, abnormal. Also,  has a past surgical history that includes Cesarean section (N/A, 06/29/2017) and Cholecystectomy (N/A, 08/22/2017)., family history includes Anemia in her mother and sister; Asthma in her sister; Diabetes in her maternal grandfather and paternal grandmother; Epilepsy in her maternal aunt.,  reports that she quit smoking about 11 years ago. Her smoking use included cigarettes. She has never used smokeless tobacco. She reports previous alcohol use. She reports that she does not use drugs.  She has a current medication list which includes the following prescription(s): ferrous sulfate, norgestimate-ethinyl estradiol triphasic, ondansetron, and multivitamin-prenatal. Also, is allergic to coconut flavor.  Review of Systems  Constitutional: Negative for chills, fever and malaise/fatigue.  HENT: Negative for congestion, sinus pain and sore throat.   Eyes: Negative for blurred vision and pain.  Respiratory: Negative for cough and wheezing.   Cardiovascular: Negative for chest pain and leg swelling.  Gastrointestinal: Negative for abdominal pain, constipation, diarrhea, heartburn, nausea and vomiting.  Genitourinary: Negative for dysuria,  frequency, hematuria and urgency.  Musculoskeletal: Negative for back pain, joint pain, myalgias and neck pain.  Skin: Negative for itching and rash.  Neurological: Negative for dizziness, tremors and weakness.  Endo/Heme/Allergies: Does not bruise/bleed easily.  Psychiatric/Behavioral: Negative for depression. The patient is not nervous/anxious and does not have insomnia.     Objective: Vitals:   11/30/20 1051  BP: 120/80   Physical Exam Constitutional:      General: She is not in acute distress.    Appearance: She is well-developed.  Genitourinary:     No vaginal erythema or bleeding.      Right Adnexa: not tender and no mass present.    Left Adnexa: not tender and no mass present.    No cervical motion tenderness, discharge, polyp or nabothian cyst.     Uterus is not enlarged.     No uterine mass detected. HENT:     Head: Normocephalic and atraumatic.     Nose: Nose normal.  Abdominal:     General: There is no distension.     Palpations: Abdomen is soft.     Tenderness: There is no abdominal tenderness.  Musculoskeletal:        General: Normal range of motion.  Neurological:     Mental Status: She is alert and oriented to person, place, and time.     Cranial Nerves: No cranial nerve deficit.  Skin:    General: Skin is warm and dry.  Psychiatric:        Attention and Perception: Attention normal.        Mood and Affect: Mood and affect normal.        Speech: Speech normal.        Behavior: Behavior normal.  Thought Content: Thought content normal.        Judgment: Judgment normal.     Assessment: 30 y.o. G2P1001 [redacted]w[redacted]d  Plan: Problem List Items Addressed This Visit      Genitourinary   CIN II (cervical intraepithelial neoplasia II) - Primary   Relevant Orders   Cytology - PAP    Other Visit Diagnoses    First trimester bleeding       Relevant Orders   Beta hCG quant (ref lab)     1) First trimester bleeding - incidence and clinical course of  first trimester bleeding is discussed in detail with the patient today.  Approximately 1/3 of pregnancies ending in live births experienced 1st trimester bleeding.  The amount of bleeding is variable and not necessarily predictive of outcome.  Sources may be cervical or uterine.  Subchorionic hemorrhages are a frequent concurrent findings on ultrasound and are followed expectantly.  These often absorb or regress spontaneously although risk for expansion and further disruption of the utero-placental interface leading to miscarriage is possible.  There is no clearly documented benefit to limiting or modifying activity and sexual intercourse in altering clinic course of 1st trimester bleeding.    2) If not already done will proceed with TVUS evaluation to document viability, and if uncertain viability or absence of a demonstrable IUP (and no previous documentation of IUP) will trend HCG levels. Will call w results from todays BETA HCG  3) The patient is Rh POS, rhogam is therefore not indicated to decrease the risk rhesus alloimmunization.    4) Routine bleeding precautions were discussed with the patient prior the conclusion of today's visit. Plan to try for pregnancy again after next normal cycle, if this is indeed a miscarriage  5) Pt also has h/o CIN II, treated w cryo last year, and PAP 6 mos ago was normal, in need for f/u PAP today  Annamarie Major, MD, [facility], [facility] 11/30/2020  11:02 AM

## 2020-12-01 LAB — BETA HCG QUANT (REF LAB): hCG Quant: 3 m[IU]/mL

## 2020-12-02 LAB — CYTOLOGY - PAP: Diagnosis: NEGATIVE

## 2021-01-04 ENCOUNTER — Encounter: Payer: Self-pay | Admitting: Nurse Practitioner

## 2021-01-04 NOTE — Progress Notes (Signed)
Patient received PAP with Dr. Tiburcio Pea on 11/26/2020.  Dr. Tiburcio Pea will continue to monitor patient's PAP.  Next PAP recommended to be repeated in 1 year.  Patient removed from [facility] PAP monitoring system.  Glenna Fellows, RN

## 2021-10-13 NOTE — Progress Notes (Deleted)
? ? ?Federico Flake, MD ? ? ?No chief complaint on file. ? ? ?HPI: ?     [name] is a 31 y.o. G2P1001 whose LMP was No LMP recorded., presents today for *** ?Neg pap 11/30/20; hx of CIN 2;  ? ? ?Patient Active Problem List  ? Diagnosis Date Noted  ? CIN II (cervical intraepithelial neoplasia II) 10/16/2019  ? 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 Asthma 03/27/2018  ? Morbid obesity (HCC) 03/27/2018  ? Calculus of gallbladder without cholecystitis without obstruction 08/07/2017  ? Postpartum anemia 07/03/2017  ? ? ?Past Surgical History:  ?Procedure Laterality Date  ? CESAREAN SECTION N/A 06/29/2017  ? Procedure: CESAREAN SECTION;  Surgeon: Conard Novak, MD;  Location: [facility] ORS;  Service: Obstetrics;  Laterality: N/A;  ? CHOLECYSTECTOMY N/A 08/22/2017  ? Procedure: LAPAROSCOPIC CHOLECYSTECTOMY;  Surgeon: Ancil Linsey, MD;  Location: [facility] ORS;  Service: General;  Laterality: N/A;  ? ? ?Family History  ?Problem Relation Age of Onset  ? Anemia Mother   ? Asthma Sister   ? Anemia Sister   ? Epilepsy Maternal Aunt   ? Diabetes Maternal Grandfather   ? Diabetes Paternal Grandmother   ? ? ?Social History  ? ?Socioeconomic History  ? Marital status: Single  ?  Spouse name: Not on file  ? Number of children: Not on file  ? Years of education: Not on file  ? Highest education level: Not on file  ?Occupational History  ? Not on file  ?Tobacco Use  ? Smoking status: Former  ?  Types: Cigarettes  ?  Quit date: 2011  ?  Years since quitting: 12.2  ? Smokeless tobacco: Never  ?Vaping Use  ? Vaping Use: Never used  ?Substance and Sexual Activity  ? Alcohol use: Not Currently  ?  Comment: last use- 11/07/20- one mixed drink  ? Drug use: No  ? Sexual activity: Yes  ?  Birth control/protection: None  ?Other Topics Concern  ? Not on file  ?Social History Narrative  ? Not on file  ? ?Social Determinants of Health  ? ?Financial Resource Strain: Not on file  ?Food  Insecurity: Not on file  ?Transportation Needs: Not on file  ?Physical Activity: Not on file  ?Stress: Not on file  ?Social Connections: Not on file  ?Intimate Partner Violence: Not At Risk  ? Fear of Current or Ex-Partner: No  ? Emotionally Abused: No  ? Physically Abused: No  ? Sexually Abused: No  ? ? ?Outpatient Medications Prior to Visit  ?Medication Sig Dispense Refill  ? ferrous sulfate 324 MG TBEC Take 324 mg by mouth daily. Pt unsure of mg takes 1 tablet daily (Patient not taking: Reported on 11/30/2020)    ? Norgestimate-Ethinyl Estradiol Triphasic (TRI-SPRINTEC) 0.18/0.215/0.25 MG-35 MCG tablet Take 1 tablet by mouth daily. (Patient not taking: No sig reported) 1 Package 12  ? ondansetron (ZOFRAN) 4 MG tablet Take 1 tablet (4 mg total) by mouth every 8 (eight) hours as needed for up to 10 doses for nausea or vomiting. (Patient not taking: No sig reported) 10 tablet 0  ? ?No facility-administered medications prior to visit.  ? ? ? ? ?ROS: ? ?Review of Systems ?BREAST: No symptoms ? ? ?OBJECTIVE:  ? ?Vitals:  ?There were no vitals taken for this visit. ? ?Physical Exam ? ?Results: ?No results found for this or any previous visit (from the past 24 hour(s)). ? ? ?  Assessment/Plan: ?No diagnosis found. ? ? ? ?No orders of the defined types were placed in this encounter. ? ? ? ? No follow-ups on file. ? ?Hannelore Bova B. Jazir Newey, PA-C ?10/13/2021 ?11:18 AM ? ? ? ? ? ?

## 2021-10-14 ENCOUNTER — Ambulatory Visit: Payer: Medicaid Other | Admitting: Obstetrics and Gynecology

## 2021-10-27 IMAGING — US US OB < 14 WEEKS - US OB TV
1 series · 13 of 28 positions shown · non-contrast
Comparison: None.

CLINICAL DATA: Last menstrual period 10/05/2020, gestational age by
last menstrual period 6 weeks and 5 days. Estimated due date by last
menstrual period 07/12/2021. Quantitative beta HCG 454. Presenting
with vaginal bleeding.

EXAM:
OBSTETRIC <14 WK US AND TRANSVAGINAL OB US
TECHNIQUE: Both transabdominal and transvaginal ultrasound examinations were
performed for complete evaluation of the gestation as well as the
maternal uterus, adnexal regions, and pelvic cul-de-sac.
Transvaginal technique was performed to assess early pregnancy.

[Series 1: us ob comp less 14 wks · 35 acquisitions, 13 frames shown]
[im 2/35]
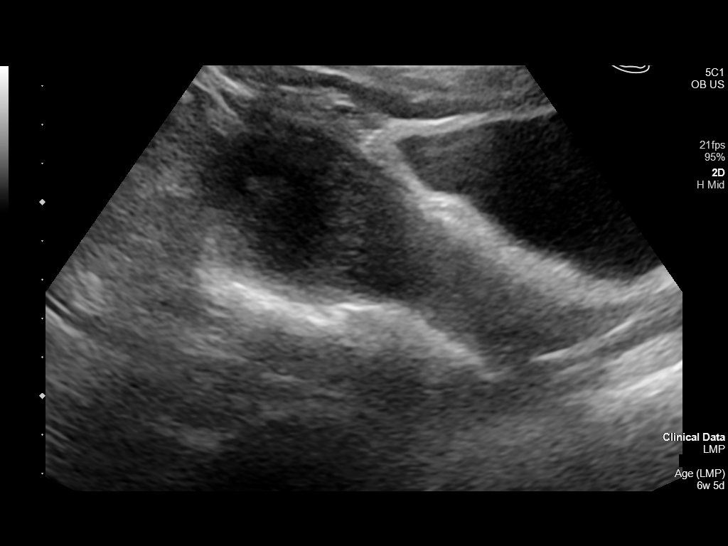
[im 4/35]
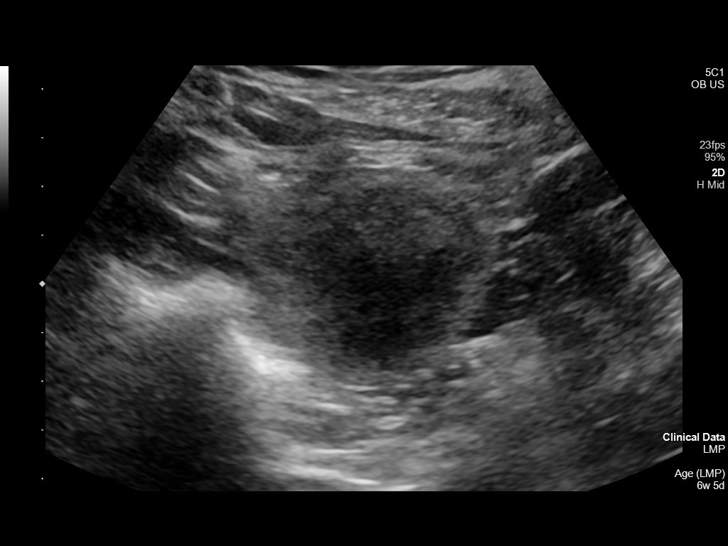
[im 7/35]
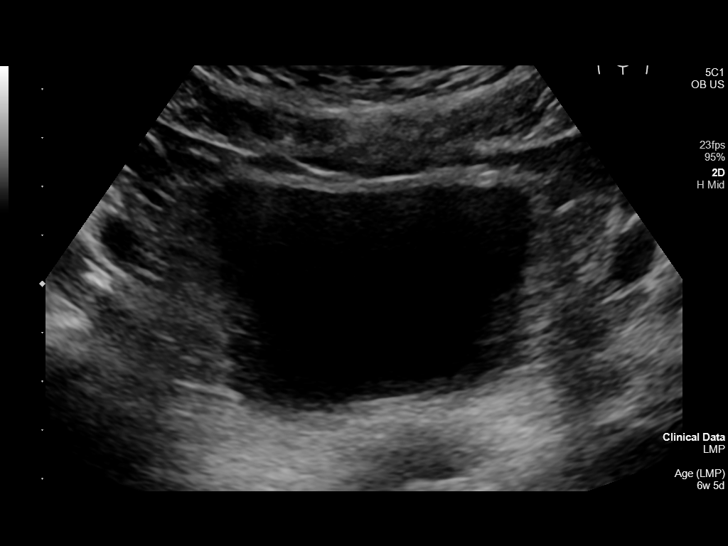
[im 9/35]
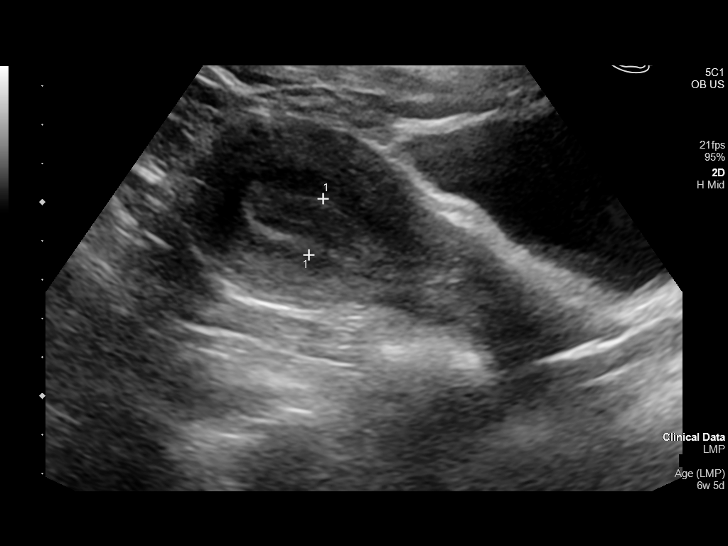
[im 12/35]
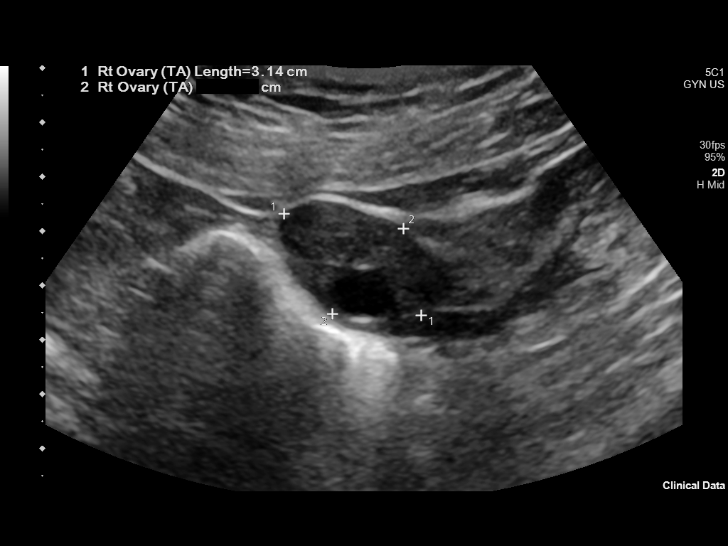
[im 14/35]
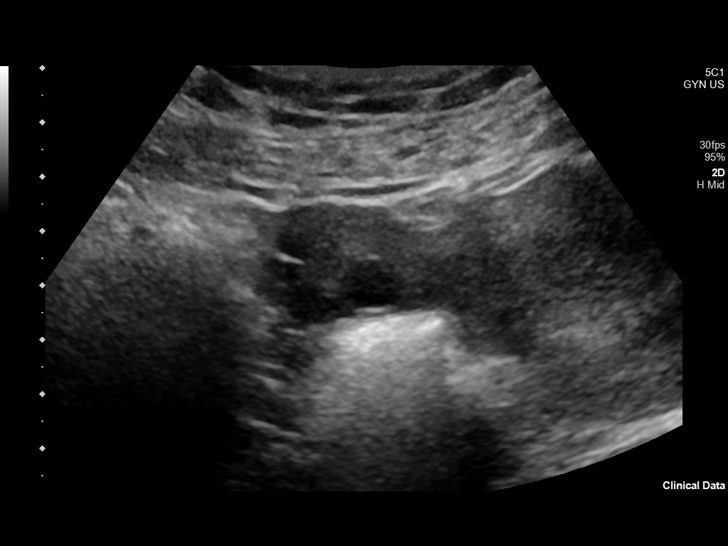
[im 18/35]
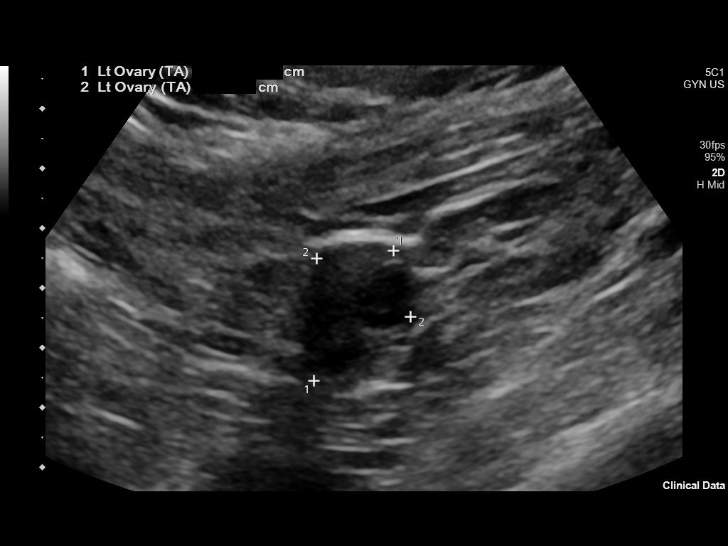
[im 21/35]
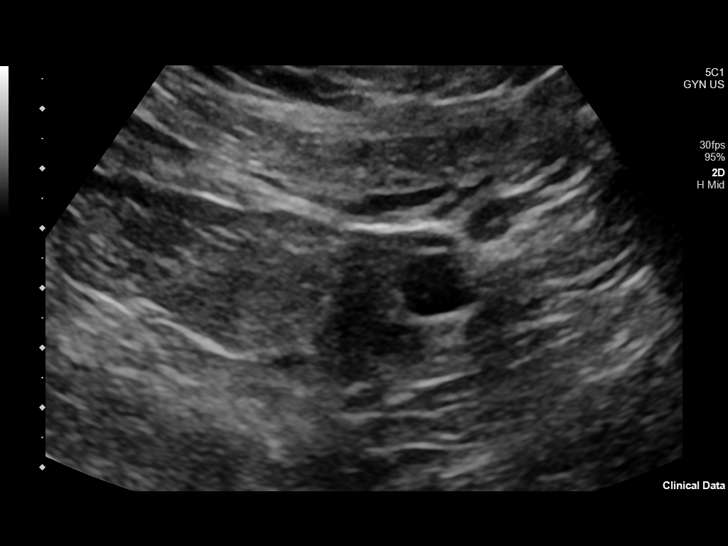
[im 23/35]
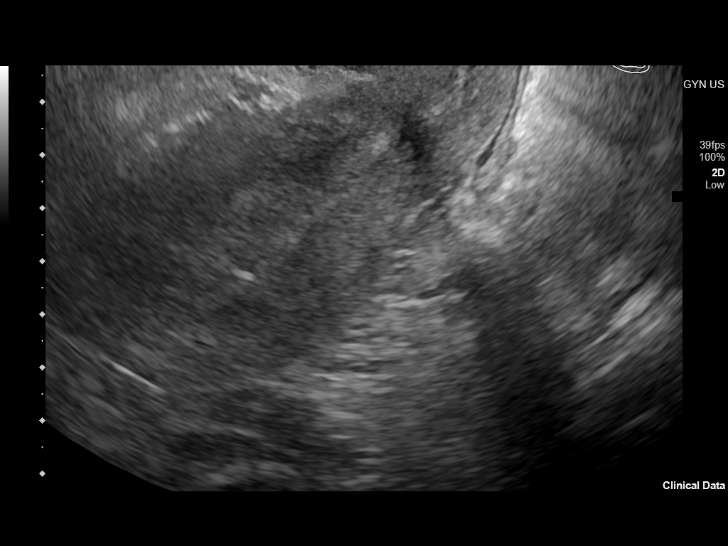
[im 26/35]
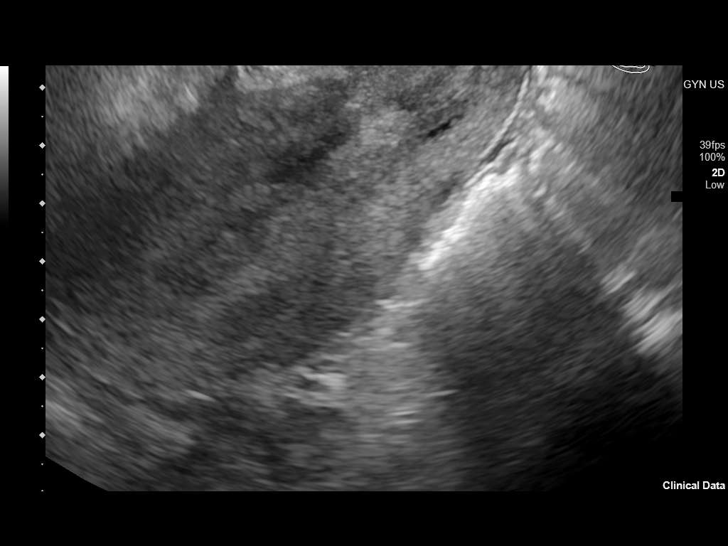
[im 28/35]
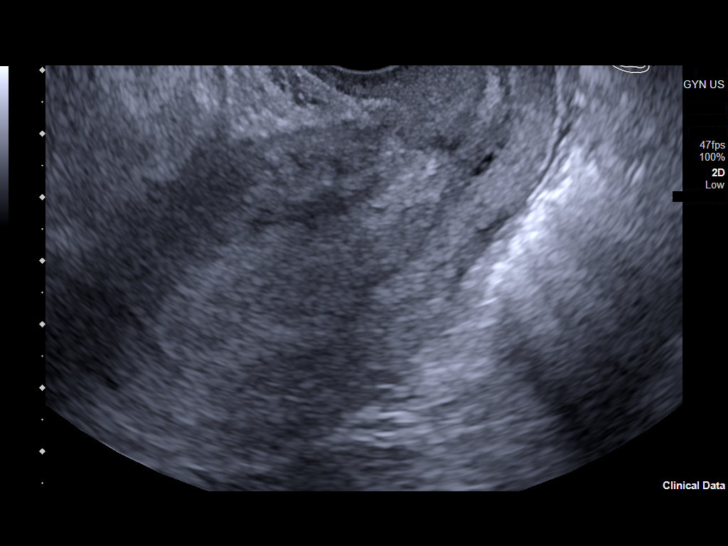
[im 31/35]
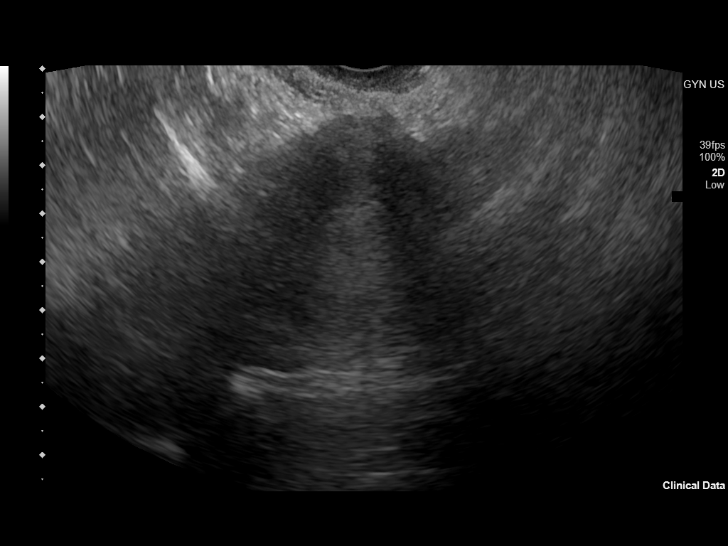
[im 33/35]
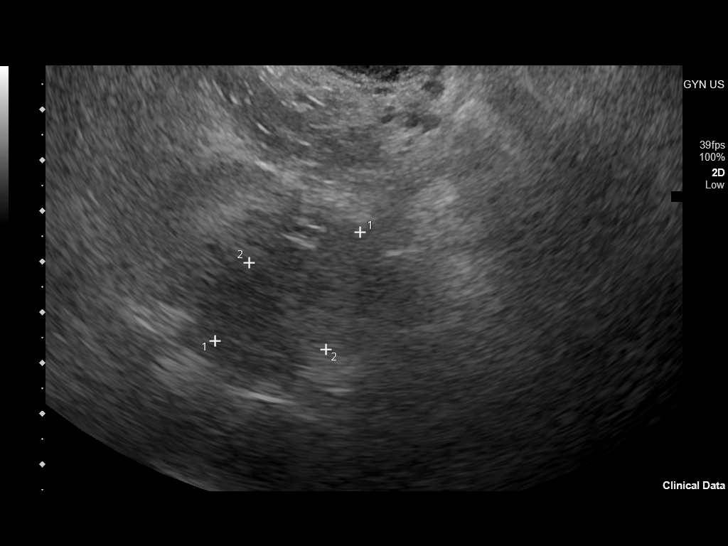

[13 of 28 positions shown; findings below may reference images not displayed]

FINDINGS: Intrauterine gestational sac: None

Yolk sac:  Not Visualized.

Embryo:  Not Visualized.

Cardiac Activity: Not Visualized.

Subchorionic hemorrhage:  None visualized.

Maternal uterus/adnexae: The endometrium measures up to 16 mm.
Endometrial fluid with movement noted within the endometrial canal.
The uterus is otherwise unremarkable. Bilateral ovaries are
unremarkable.
IMPRESSION: No intrauterine gestational sac, yolk sac, fetal pole, or cardiac
activity yet visualized. Given endometrial canal movement of fluid
in the setting of a positive quantitative beta HCG findings are
suspicious but not yet definitive for failed pregnancy. Differential
diagnosis includes a nonvisualized ectopic pregnancy. Recommend
trending of quantitative B-HCG levels and follow-up US in 14 days to
assess for pregnancy. This recommendation follows SRU consensus
guidelines: Diagnostic Criteria for Nonviable Pregnancy Early in the
First Trimester. N Engl J Med 5094; [DATE].

## 2021-12-08 ENCOUNTER — Ambulatory Visit: Payer: Medicaid Other | Admitting: Obstetrics and Gynecology

## 2022-06-14 ENCOUNTER — Emergency Department
Admission: EM | Admit: 2022-06-14 | Discharge: 2022-06-14 | Disposition: A | Discharge status: Home / Self Care | Payer: Medicaid Other | Attending: Emergency Medicine | Admitting: Emergency Medicine

## 2022-06-14 ENCOUNTER — Encounter: Payer: Self-pay | Admitting: Emergency Medicine

## 2022-06-14 ENCOUNTER — Other Ambulatory Visit: Payer: Self-pay

## 2022-06-14 ENCOUNTER — Telehealth: Payer: Self-pay | Admitting: Emergency Medicine

## 2022-06-14 DIAGNOSIS — J020 Streptococcal pharyngitis: Secondary | ICD-10-CM | POA: Diagnosis not present

## 2022-06-14 DIAGNOSIS — Z20822 Contact with and (suspected) exposure to covid-19: Secondary | ICD-10-CM | POA: Insufficient documentation

## 2022-06-14 DIAGNOSIS — J029 Acute pharyngitis, unspecified: Secondary | ICD-10-CM | POA: Diagnosis present

## 2022-06-14 LAB — GROUP A STREP BY PCR: Group A Strep by PCR: DETECTED — AB

## 2022-06-14 LAB — RESP PANEL BY RT-PCR (FLU A&B, COVID) ARPGX2
Influenza A by PCR: NEGATIVE
Influenza B by PCR: NEGATIVE
SARS Coronavirus 2 by RT PCR: NEGATIVE

## 2022-06-14 MED ORDER — AMOXICILLIN 875 MG PO TABS: 875.0000 mg | ORAL_TABLET | Freq: Two times a day (BID) | ORAL | 0 refills | Status: AC

## 2022-06-14 MED ORDER — DEXAMETHASONE 1 MG/ML PO CONC
10.0000 mg | Freq: Once | ORAL | Status: AC
  Administered 2022-06-14: 10 mg via ORAL
  Filled 2022-06-14: qty 10

## 2022-06-14 NOTE — ED Provider Notes (Addendum)
   [facility] Provider Note    Event Date/Time   First MD Initiated Contact with Patient 06/14/22 [phone]     (approximate)   History   Sore Throat   HPI  Rebecca Walton is a 31 y.o. female  with sore throat since having COVID on 11/10. Reports worsening pain even with tylenol. Still tolerating eating/drinking. No known fevers.  Reports tylenol without relief. + strep contact.  Denies any concern for STDS.  Pt is married.    Reports frequent irregular periods. No pregnancy symptoms.    Physical Exam   Triage Vital Signs: ED Triage Vitals [06/14/22 0920]  Enc Vitals Group     BP      Pulse      Resp      Temp      Temp src      SpO2      Weight 227 lb 1.2 oz (103 kg)     Height 5' (1.524 m)     Head Circumference      Peak Flow      Pain Score 7     Pain Loc      Pain Edu?      Excl. in GC?     Most recent vital signs: Vitals:   06/14/22 0939  BP: 128/73  Pulse: 93  Resp: 16  Temp: 98.2 F (36.8 C)  SpO2: 100%     General: Awake, no distress.  CV:  Good peripheral perfusion.  Resp:  Normal effort.  Abd:  No distention.  Other:  Erythema noted to bilateral tonsil. Uvula midline. Full ROM of neck    ED Results / Procedures / Treatments   L PROCEDURES:  Critical Care performed: No  Procedures   MEDICATIONS ORDERED IN ED: Medications  dexamethasone (DECADRON) 1 MG/ML solution 10 mg (has no administration in time range)     IMPRESSION / MDM / ASSESSMENT AND PLAN / ED COURSE  I reviewed the triage vital signs and the nursing notes.   Patient's presentation is most consistent with acute, uncomplicated illness.   Differential: viral phayngitis, strep, flu.  No signs of PTA/RPA.   Offered preg test to do ibuprofen but pt declined ibuprofen and declined preg test.   Pt prefer to leave will call with results  [phone]. Meds to [facility]. No allergies.   Strep + sent over antibiotics and called pt to alert her  x2 and left message.   The patient is on the cardiac monitor to evaluate for evidence of arrhythmia and/or significant heart rate changes.      FINAL CLINICAL IMPRESSION(S) / ED DIAGNOSES   Final diagnoses:  Sore throat     Rx / DC Orders   ED Discharge Orders     None        Note:  This document was prepared using Dragon voice recognition software and may include unintentional dictation errors.   Concha Se, MD 06/14/22 1007    Concha Se, MD 06/14/22 1043

## 2022-06-14 NOTE — Discharge Instructions (Signed)
We will call you with results- please leave phone on you.   We gave steroids to help with the sore throat.  Take tylenol 1g every 8 hours for pain.

## 2022-06-14 NOTE — Telephone Encounter (Signed)
Called patient to notify of positive strep.  Provider already sent rx to pharmacy.  Spoke with patient and informed of result and need for antibiotics.  Encouraged her to get them started today.

## 2022-06-14 NOTE — ED Triage Notes (Signed)
Presents with sore throat  unsure of fever

## 2022-06-14 NOTE — ED Notes (Signed)
Patient discharged to home per MD order. Patient in stable condition, and deemed medically cleared by ED provider for discharge. Discharge instructions reviewed with patient/family using "Teach Back"; verbalized understanding of medication education and administration, and information about follow-up care. Denies further concerns. ° °

## 2023-01-17 ENCOUNTER — Ambulatory Visit (LOCAL_COMMUNITY_HEALTH_CENTER): Payer: Medicaid Other

## 2023-01-17 VITALS — BP 116/62 | Ht 60.0 in | Wt 229.0 lb

## 2023-01-17 DIAGNOSIS — Z308 Encounter for other contraceptive management: Secondary | ICD-10-CM | POA: Diagnosis not present

## 2023-01-17 DIAGNOSIS — Z3201 Encounter for pregnancy test, result positive: Secondary | ICD-10-CM

## 2023-01-17 LAB — PREGNANCY, URINE: Preg Test, Ur: POSITIVE — AB

## 2023-01-17 MED ORDER — PRENATAL 27-0.8 MG PO TABS: 1.0000 | ORAL_TABLET | Freq: Every day | ORAL | 0 refills | Status: AC

## 2023-01-17 NOTE — Progress Notes (Signed)
UPT positive. Plans prenatal care at [facility].   The patient was dispensed prenatal vitamins #100 today per SO Dr K Newton. I provided counseling today regarding the medication. We discussed the medication, the side effects and when to call clinic. Patient given the opportunity to ask questions. Questions answered.   Sent to DSS for medicaid/preg women. Kaeleb Emond, RN   

## 2023-01-28 ENCOUNTER — Emergency Department
Admission: EM | Admit: 2023-01-28 | Discharge: 2023-01-28 | Disposition: A | Discharge status: Home / Self Care | Payer: Medicaid Other | Attending: Emergency Medicine | Admitting: Emergency Medicine

## 2023-01-28 ENCOUNTER — Emergency Department: Payer: Medicaid Other

## 2023-01-28 ENCOUNTER — Other Ambulatory Visit: Payer: Self-pay

## 2023-01-28 DIAGNOSIS — O4690 Antepartum hemorrhage, unspecified, unspecified trimester: Secondary | ICD-10-CM

## 2023-01-28 DIAGNOSIS — O4691 Antepartum hemorrhage, unspecified, first trimester: Secondary | ICD-10-CM | POA: Diagnosis present

## 2023-01-28 DIAGNOSIS — Z3A01 Less than 8 weeks gestation of pregnancy: Secondary | ICD-10-CM | POA: Insufficient documentation

## 2023-01-28 LAB — BASIC METABOLIC PANEL
Anion gap: 8 (ref 5–15)
BUN: 11 mg/dL (ref 6–20)
CO2: 26 mmol/L (ref 22–32)
Calcium: 9.4 mg/dL (ref 8.9–10.3)
Chloride: 103 mmol/L (ref 98–111)
Creatinine, Ser: 0.63 mg/dL (ref 0.44–1.00)
GFR, Estimated: >60 mL/min (ref >60)
Glucose, Bld: 165 mg/dL — ABNORMAL HIGH (ref 70–99)
Potassium: 3.7 mmol/L (ref 3.5–5.1)
Sodium: 137 mmol/L (ref 135–145)

## 2023-01-28 LAB — URINALYSIS, ROUTINE W REFLEX MICROSCOPIC
Bilirubin Urine: NEGATIVE
Glucose, UA: NEGATIVE mg/dL
Hgb urine dipstick: NEGATIVE
Ketones, ur: 5 mg/dL — AB
Leukocytes,Ua: NEGATIVE
Nitrite: NEGATIVE
Protein, ur: NEGATIVE mg/dL
Specific Gravity, Urine: 1.023 (ref 1.005–1.030)
pH: 5 (ref 5.0–8.0)

## 2023-01-28 LAB — POC URINE PREG, ED: Preg Test, Ur: POSITIVE — AB

## 2023-01-28 LAB — CBC
HCT: 36.3 % (ref 36.0–46.0)
Hemoglobin: 11.4 g/dL — ABNORMAL LOW (ref 12.0–15.0)
MCH: 24.3 pg — ABNORMAL LOW (ref 26.0–34.0)
MCHC: 31.4 g/dL (ref 30.0–36.0)
MCV: 77.4 fL — ABNORMAL LOW (ref 80.0–100.0)
Platelets: 311 10*3/uL (ref 150–400)
RBC: 4.69 MIL/uL (ref 3.87–5.11)
RDW: 14.6 % (ref 11.5–15.5)
WBC: 10 10*3/uL (ref 4.0–10.5)
nRBC: 0 % (ref 0.0–0.2)

## 2023-01-28 LAB — ABO/RH: ABO/RH(D): A POS

## 2023-01-28 LAB — HCG, QUANTITATIVE, PREGNANCY: hCG, Beta Chain, Quant, S: 6982 m[IU]/mL — ABNORMAL HIGH (ref <5)

## 2023-01-28 NOTE — ED Notes (Signed)
Patient declined discharge vital signs. 

## 2023-01-28 NOTE — ED Notes (Signed)
Patient c/o mild cramps, scant amount of blood on tissues when wiping. Patient states her 2nd pregnancy ended in a miscarriage at about the same gestational time as this pregnancy.

## 2023-01-28 NOTE — ED Triage Notes (Signed)
Pt presents via POV c/o vaginal bleeding since this am. Reports looks like a blood clot. Reports 6wks OB. Reports had miscarriage x2 years prior with similar symptoms. Denies pain.

## 2023-01-28 NOTE — ED Provider Notes (Signed)
[facility] Provider Note    Event Date/Time   First MD Initiated Contact with Patient 01/28/23 [phone]     (approximate)   History   Vaginal Bleeding   HPI  Rebecca Walton is a 32 y.o. female G3P1 presents to the ER with vaginal bleeding and cramping, pt states she passed a large blood clot this morning,  some cramping in the pelvic area, hx of miscarriage 2 years ago and feels the same, states followed at [facility] but has not seen the doctor yet since positive preg test.  Denies fever, chills, v/d/dysuria      Physical Exam   Triage Vital Signs: ED Triage Vitals  Encounter Vitals Group     BP 01/28/23 0918 (!) 147/85     Systolic BP Percentile --      Diastolic BP Percentile --      Pulse Rate 01/28/23 0918 87     Resp 01/28/23 0918 16     Temp 01/28/23 0918 98.6 F (37 C)     Temp Source 01/28/23 0918 Oral     SpO2 01/28/23 0918 97 %     Weight 01/28/23 0914 230 lb (104.3 kg)     Height 01/28/23 0914 5' (1.524 m)     Head Circumference --      Peak Flow --      Pain Score 01/28/23 0914 0     Pain Loc --      Pain Education --      Exclude from Growth Chart --     Most recent vital signs: Vitals:   01/28/23 0918  BP: (!) 147/85  Pulse: 87  Resp: 16  Temp: 98.6 F (37 C)  SpO2: 97%     General: Awake, no distress.   CV:  Good peripheral perfusion. regular rate and  rhythm Resp:  Normal effort.  Abd:  No distention.  Tender in the suprapubic area Other:      ED Results / Procedures / Treatments   Labs (all labs ordered are listed, but only abnormal results are displayed) Labs Reviewed  URINALYSIS, ROUTINE W REFLEX MICROSCOPIC - Abnormal; Notable for the following components:      Result Value   Color, Urine YELLOW (*)    APPearance CLEAR (*)    Ketones, ur 5 (*)    All other components within normal limits  BASIC METABOLIC PANEL - Abnormal; Notable for the following components:   Glucose, Bld 165 (*)    All other  components within normal limits  CBC - Abnormal; Notable for the following components:   Hemoglobin 11.4 (*)    MCV 77.4 (*)    MCH 24.3 (*)    All other components within normal limits  HCG, QUANTITATIVE, PREGNANCY - Abnormal; Notable for the following components:   hCG, Beta Chain, Quant, S 6,982 (*)    All other components within normal limits  POC URINE PREG, ED - Abnormal; Notable for the following components:   Preg Test, Ur Positive (*)    All other components within normal limits  ABO/RH     EKG     RADIOLOGY Urinalysis 14 weeks    PROCEDURES:   Procedures   MEDICATIONS ORDERED IN ED: Medications - No data to display   IMPRESSION / MDM / ASSESSMENT AND PLAN / ED COURSE  I reviewed the triage vital signs and the nursing notes.  Differential diagnosis includes, but is not limited to, threatened miscarriage, subchorionic hemorrhage, miscarriage, ectopic  Patient's presentation is most consistent with acute presentation with potential threat to life or bodily function.   Labs and imaging ordered  Labs are reassuring  Ultrasound OB less than 14 weeks shows a gestational sac, no embryo or heartbeat  Radiologist comments that the gestation is about 5 weeks 4 days.  Recommends that she have a repeat ultrasound in serial beta hCGs.  I did explain these findings to the patient.  Explained to her that at this time everything looks okay but it is too early to tell.  She is to follow-up with [facility] as this is who she used to go to.  She is to return the emergency department if she is soaking more than 2 pads per hour, having worse abdominal pain or heavier bleeding.  She is to follow-up with  OB/GYN for repeat beta-hCG and repeat ultrasound in 10 days.  Patient is in agreement treatment plan.  She is discharged stable condition.     FINAL CLINICAL IMPRESSION(S) / ED DIAGNOSES   Final diagnoses:  Vaginal bleeding in  pregnancy     Rx / DC Orders   ED Discharge Orders     None        Note:  This document was prepared using Dragon voice recognition software and may include unintentional dictation errors.    Faythe Ghee, PA-C 01/28/23 1218    Chesley Noon, MD 01/28/23 [phone]

## 2023-02-08 ENCOUNTER — Ambulatory Visit (INDEPENDENT_AMBULATORY_CARE_PROVIDER_SITE_OTHER): Payer: Medicaid Other

## 2023-02-08 VITALS — Wt 230.0 lb

## 2023-02-08 DIAGNOSIS — Z369 Encounter for antenatal screening, unspecified: Secondary | ICD-10-CM

## 2023-02-08 DIAGNOSIS — O0990 Supervision of high risk pregnancy, unspecified, unspecified trimester: Secondary | ICD-10-CM | POA: Insufficient documentation

## 2023-02-08 DIAGNOSIS — Z3480 Encounter for supervision of other normal pregnancy, unspecified trimester: Secondary | ICD-10-CM

## 2023-02-08 DIAGNOSIS — Z3689 Encounter for other specified antenatal screening: Secondary | ICD-10-CM

## 2023-02-08 NOTE — Progress Notes (Signed)
New OB Intake  I connected with  Rebecca Walton on 02/08/23 at 11:15 AM EDT by telephone Video Visit and verified that I am speaking with the correct person using two identifiers. Nurse is located at [facility] and pt is located at home.  I explained I am completing New OB Intake today. We discussed her EDD of 09/22/2023 that is based on LMP of 12/16/2022. Pt is G3/P1011. I reviewed her allergies, medications, Medical/Surgical/OB history, and appropriate screenings. There are no cats in the home.  Based on history, this is a/an pregnancy uncomplicated .   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  Anatomy US Explained first scheduled [place] was done 01/28/23; pt to schedule a second dating u/s; an anatomy scan will be done at 20 weeks.  Labs Discussed genetic screening with patient. Patient desires genetic testing to be drawn at new OB visit. Discussed possible labs to be drawn at new OB appointment.  COVID Vaccine Patient has not had COVID vaccine.   Social Determinants of Health Food Insecurity: denies food insecurity Transportation: Patient denies transportation needs. Childcare: Discussed no children allowed at ultrasound appointments.   First visit review I reviewed new OB appt with pt. I explained she will have ob bloodwork and pap smear/pelvic exam if indicated. Explained pt will be seen by an AOB provider at first visit; encounter routed to appropriate provider.   Loran Senters, [facility] 02/08/2023  11:37  AM

## 2023-02-08 NOTE — Patient Instructions (Signed)
First Trimester of Pregnancy  The first trimester of pregnancy starts on the first day of your last menstrual period until the end of week 12. This is also called months 1 through 3 of pregnancy. Body changes during your first trimester Your body goes through many changes during pregnancy. The changes usually return to normal after your baby is born. Physical changes You may gain or lose weight. Your breasts may grow larger and hurt. The area around your nipples may get darker. Dark spots or blotches may develop on your face. You may have changes in your hair. Health changes You may feel like you might vomit (nauseous), and you may vomit. You may have heartburn. You may have headaches. You may have trouble pooping (constipation). Your gums may bleed. Other changes You may get tired easily. You may pee (urinate) more often. Your menstrual periods will stop. You may not feel hungry. You may want to eat certain kinds of food. You may have changes in your emotions from day to day. You may have more dreams. Follow these instructions at home: Medicines Take over-the-counter and prescription medicines only as told by your doctor. Some medicines are not safe during pregnancy. Take a prenatal vitamin that contains at least 600 micrograms (mcg) of folic acid. Eating and drinking Eat healthy meals that include: Fresh fruits and vegetables. Whole grains. Good sources of protein, such as meat, eggs, or tofu. Low-fat dairy products. Avoid raw meat and unpasteurized juice, milk, and cheese. If you feel like you may vomit, or you vomit: Eat 4 or 5 small meals a day instead of 3 large meals. Try eating a few soda crackers. Drink liquids between meals instead of during meals. You may need to take these actions to prevent or treat trouble pooping: Drink enough fluids to keep your pee (urine) pale yellow. Eat foods that are high in fiber. These include beans, whole grains, and fresh fruits and  vegetables. Limit foods that are high in fat and sugar. These include fried or sweet foods. Activity Exercise only as told by your doctor. Most people can do their usual exercise routine during pregnancy. Stop exercising if you have cramps or pain in your lower belly (abdomen) or low back. Do not exercise if it is too hot or too humid, or if you are in a place of great height (high altitude). Avoid heavy lifting. If you choose to, you may have sex unless your doctor tells you not to. Relieving pain and discomfort Wear a good support bra if your breasts are sore. Rest with your legs raised (elevated) if you have leg cramps or low back pain. If you have bulging veins (varicose veins) in your legs: Wear support hose as told by your doctor. Raise your feet for 15 minutes, 3-4 times a day. Limit salt in your food. Safety Wear your seat belt at all times when you are in a car. Talk with your doctor if someone is hurting you or yelling at you. Talk with your doctor if you are feeling sad or have thoughts of hurting yourself. Lifestyle Do not use hot tubs, steam rooms, or saunas. Do not douche. Do not use tampons or scented sanitary pads. Do not use herbal medicines, illegal drugs, or medicines that are not approved by your doctor. Do not drink alcohol. Do not smoke or use any products that contain nicotine or tobacco. If you need help quitting, ask your doctor. Avoid cat litter boxes and soil that is used by cats. These carry   germs that can cause harm to the baby and can cause a loss of your baby by miscarriage or stillbirth. General instructions Keep all follow-up visits. This is important. Ask for help if you need counseling or if you need help with nutrition. Your doctor can give you advice or tell you where to go for help. Visit your dentist. At home, brush your teeth with a soft toothbrush. Floss gently. Write down your questions. Take them to your prenatal visits. Where to find more  information American Pregnancy Association: americanpregnancy.org American College of Obstetricians and Gynecologists: www.acog.org Office on Women's Health: womenshealth.gov/pregnancy Contact a doctor if: You are dizzy. You have a fever. You have mild cramps or pressure in your lower belly. You have a nagging pain in your belly area. You continue to feel like you may vomit, you vomit, or you have watery poop (diarrhea) for 24 hours or longer. You have a bad-smelling fluid coming from your vagina. You have pain when you pee. You are exposed to a disease that spreads from person to person, such as chickenpox, measles, Zika virus, HIV, or hepatitis. Get help right away if: You have spotting or bleeding from your vagina. You have very bad belly cramping or pain. You have shortness of breath or chest pain. You have any kind of injury, such as from a fall or a car crash. You have new or increased pain, swelling, or redness in an arm or leg. Summary The first trimester of pregnancy starts on the first day of your last menstrual period until the end of week 12 (months 1 through 3). Eat 4 or 5 small meals a day instead of 3 large meals. Do not smoke or use any products that contain nicotine or tobacco. If you need help quitting, ask your doctor. Keep all follow-up visits. This information is not intended to replace advice given to you by your health care provider. Make sure you discuss any questions you have with your health care provider. Document Revised: 12/11/2019 Document Reviewed: 10/17/2019 Elsevier Patient Education  2024 [employer] Commonly Asked Questions During Pregnancy  Cats: A parasite can be excreted in cat feces.  To avoid exposure you need to have another person empty the little box.  If you must empty the litter box you will need to wear gloves.  Wash your hands after handling your cat.  This parasite can also be found in raw or undercooked meat so this should also be  avoided.  Colds, Sore Throats, Flu: Please check your medication sheet to see what you can take for symptoms.  If your symptoms are unrelieved by these medications please call the office.  Dental Work: Most any dental work your dentist recommends is permitted.  X-rays should only be taken during the first trimester if absolutely necessary.  Your abdomen should be shielded with a lead apron during all x-rays.  Please notify your provider prior to receiving any x-rays.  Novocaine is fine; gas is not recommended.  If your dentist requires a note from us prior to dental work please call the office and we will provide one for you.  Exercise: Exercise is an important part of staying healthy during your pregnancy.  You may continue most exercises you were accustomed to prior to pregnancy.  Later in your pregnancy you will most likely notice you have difficulty with activities requiring balance like riding a bicycle.  It is important that you listen to your body and avoid activities that put you at a higher   risk of falling.  Adequate rest and staying well hydrated are a must!  If you have questions about the safety of specific activities ask your provider.    Exposure to Children with illness: Try to avoid obvious exposure; report any symptoms to us when noted,  If you have chicken pos, red measles or mumps, you should be immune to these diseases.   Please do not take any vaccines while pregnant unless you have checked with your OB provider.  Fetal Movement: After 28 weeks we recommend you do "kick counts" twice daily.  Lie or sit down in a calm quiet environment and count your baby movements "kicks".  You should feel your baby at least 10 times per hour.  If you have not felt 10 kicks within the first hour get up, walk around and have something sweet to eat or drink then repeat for an additional hour.  If count remains less than 10 per hour notify your provider.  Fumigating: Follow your pest control agent's  advice as to how long to stay out of your home.  Ventilate the area well before re-entering.  Hemorrhoids:   Most over-the-counter preparations can be used during pregnancy.  Check your medication to see what is safe to use.  It is important to use a stool softener or fiber in your diet and to drink lots of liquids.  If hemorrhoids seem to be getting worse please call the office.   Hot Tubs:  Hot tubs Jacuzzis and saunas are not recommended while pregnant.  These increase your internal body temperature and should be avoided.  Intercourse:  Sexual intercourse is safe during pregnancy as long as you are comfortable, unless otherwise advised by your provider.  Spotting may occur after intercourse; report any bright red bleeding that is heavier than spotting.  Labor:  If you know that you are in labor, please go to the hospital.  If you are unsure, please call the office and let us help you decide what to do.  Lifting, straining, etc:  If your job requires heavy lifting or straining please check with your provider for any limitations.  Generally, you should not lift items heavier than that you can lift simply with your hands and arms (no back muscles)  Painting:  Paint fumes do not harm your pregnancy, but may make you ill and should be avoided if possible.  Latex or water based paints have less odor than oils.  Use adequate ventilation while painting.  Permanents & Hair Color:  Chemicals in hair dyes are not recommended as they cause increase hair dryness which can increase hair loss during pregnancy.  " Highlighting" and permanents are allowed.  Dye may be absorbed differently and permanents may not hold as well during pregnancy.  Sunbathing:  Use a sunscreen, as skin burns easily during pregnancy.  Drink plenty of fluids; avoid over heating.  Tanning Beds:  Because their possible side effects are still unknown, tanning beds are not recommended.  Ultrasound Scans:  Routine ultrasounds are performed  at approximately 20 weeks.  You will be able to see your baby's general anatomy an if you would like to know the gender this can usually be determined as well.  If it is questionable when you conceived you may also receive an ultrasound early in your pregnancy for dating purposes.  Otherwise ultrasound exams are not routinely performed unless there is a medical necessity.  Although you can request a scan we ask that you pay for it when   conducted because insurance does not cover " patient request" scans.  Work: If your pregnancy proceeds without complications you may work until your due date, unless your physician or employer advises otherwise.  Round Ligament Pain/Pelvic Discomfort:  Sharp, shooting pains not associated with bleeding are fairly common, usually occurring in the second trimester of pregnancy.  They tend to be worse when standing up or when you remain standing for long periods of time.  These are the result of pressure of certain pelvic ligaments called "round ligaments".  Rest, Tylenol and heat seem to be the most effective relief.  As the womb and fetus grow, they rise out of the pelvis and the discomfort improves.  Please notify the office if your pain seems different than that described.  It may represent a more serious condition.  Common Medications Safe in Pregnancy  Acne:      Constipation:  Benzoyl Peroxide     Colace  Clindamycin      Dulcolax Suppository  Topica Erythromycin     Fibercon  Salicylic Acid      Metamucil         Miralax AVOID:        Senakot   Accutane    Cough:  Retin-A       Cough Drops  Tetracycline      Phenergan w/ Codeine if Rx  Minocycline      Robitussin (Plain & DM)  Antibiotics:     Crabs/Lice:  Ceclor       RID  Cephalosporins    AVOID:  E-Mycins      Kwell  Keflex  Macrobid/Macrodantin   Diarrhea:  Penicillin      Kao-Pectate  Zithromax      Imodium AD         PUSH FLUIDS AVOID:       Cipro     Fever:  Tetracycline      Tylenol (Regular  or Extra  Minocycline       Strength)  Levaquin      Extra Strength-Do not          Exceed 8 tabs/24 hrs Caffeine:        <200mg/day (equiv. To 1 cup of coffee or  approx. 3 12 oz sodas)         Gas: Cold/Hayfever:       Gas-X  Benadryl      Mylicon  Claritin       Phazyme  **Claritin-D        Chlor-Trimeton    Headaches:  Dimetapp      ASA-Free Excedrin  Drixoral-Non-Drowsy     Cold Compress  Mucinex (Guaifenasin)     Tylenol (Regular or Extra  Sudafed/Sudafed-12 Hour     Strength)  **Sudafed PE Pseudoephedrine   Tylenol Cold & Sinus     Vicks Vapor Rub  Zyrtec  **AVOID if Problems With Blood Pressure         Heartburn: Avoid lying down for at least 1 hour after meals  Aciphex      Maalox     Rash:  Milk of Magnesia     Benadryl    Mylanta       1% Hydrocortisone Cream  Pepcid  Pepcid Complete   Sleep Aids:  Prevacid      Ambien   Prilosec       Benadryl  Rolaids       Chamomile Tea  Tums (Limit 4/day)     Unisom           Tylenol PM         Warm milk-add vanilla or  Hemorrhoids:       Sugar for taste  Anusol/Anusol H.C.  (RX: Analapram 2.5%)  Sugar Substitutes:  Hydrocortisone OTC     Ok in moderation  Preparation H      Tucks        Vaseline lotion applied to tissue with wiping    Herpes:     Throat:  Acyclovir      Oragel  Famvir  Valtrex     Vaccines:         Flu Shot Leg Cramps:       *Gardasil  Benadryl      Hepatitis A         Hepatitis B Nasal Spray:       Pneumovax  Saline Nasal Spray     Polio Booster         Tetanus Nausea:       Tuberculosis test or PPD  Vitamin B6 25 mg TID   AVOID:    Dramamine      *Gardasil  Emetrol       Live Poliovirus  Ginger Root 250 mg QID    MMR (measles, mumps &  High Complex Carbs @ Bedtime    rebella)  Sea Bands-Accupressure    Varicella (Chickenpox)  Unisom 1/2 tab TID     *No known complications           If received before Pain:         Known pregnancy;   Darvocet       Resume series  after  Lortab        Delivery  Percocet    Yeast:   Tramadol      Femstat  Tylenol 3      Gyne-lotrimin  Ultram       Monistat  Vicodin           MISC:         All Sunscreens           Hair Coloring/highlights          Insect Repellant's          (Including DEET)         Mystic Tans  

## 2023-02-21 ENCOUNTER — Other Ambulatory Visit: Payer: Self-pay

## 2023-02-21 ENCOUNTER — Encounter: Payer: Self-pay | Admitting: *Deleted

## 2023-02-21 ENCOUNTER — Emergency Department
Admission: EM | Admit: 2023-02-21 | Discharge: 2023-02-21 | Disposition: A | Discharge status: Home / Self Care | Payer: Medicaid Other | Attending: Emergency Medicine | Admitting: Emergency Medicine

## 2023-02-21 ENCOUNTER — Emergency Department: Payer: Medicaid Other

## 2023-02-21 DIAGNOSIS — O208 Other hemorrhage in early pregnancy: Secondary | ICD-10-CM

## 2023-02-21 DIAGNOSIS — Z3A09 9 weeks gestation of pregnancy: Secondary | ICD-10-CM | POA: Diagnosis not present

## 2023-02-21 DIAGNOSIS — O209 Hemorrhage in early pregnancy, unspecified: Secondary | ICD-10-CM | POA: Diagnosis present

## 2023-02-21 DIAGNOSIS — O200 Threatened abortion: Secondary | ICD-10-CM | POA: Insufficient documentation

## 2023-02-21 LAB — COMPREHENSIVE METABOLIC PANEL
ALT: 20 U/L (ref 0–44)
AST: 20 U/L (ref 15–41)
Albumin: 3.6 g/dL (ref 3.5–5.0)
Alkaline Phosphatase: 77 U/L (ref 38–126)
Anion gap: 10 (ref 5–15)
BUN: 8 mg/dL (ref 6–20)
CO2: 23 mmol/L (ref 22–32)
Calcium: 8.9 mg/dL (ref 8.9–10.3)
Chloride: 104 mmol/L (ref 98–111)
Creatinine, Ser: 0.61 mg/dL (ref 0.44–1.00)
GFR, Estimated: >60 mL/min (ref >60)
Glucose, Bld: 94 mg/dL (ref 70–99)
Potassium: 4 mmol/L (ref 3.5–5.1)
Sodium: 137 mmol/L (ref 135–145)
Total Bilirubin: 0.4 mg/dL (ref 0.3–1.2)
Total Protein: 7.3 g/dL (ref 6.5–8.1)

## 2023-02-21 LAB — CBC
HCT: 38.2 % (ref 36.0–46.0)
Hemoglobin: 11.8 g/dL — ABNORMAL LOW (ref 12.0–15.0)
MCH: 24.3 pg — ABNORMAL LOW (ref 26.0–34.0)
MCHC: 30.9 g/dL (ref 30.0–36.0)
MCV: 78.6 fL — ABNORMAL LOW (ref 80.0–100.0)
Platelets: 289 10*3/uL (ref 150–400)
RBC: 4.86 MIL/uL (ref 3.87–5.11)
RDW: 14.3 % (ref 11.5–15.5)
WBC: 7.8 10*3/uL (ref 4.0–10.5)
nRBC: 0 % (ref 0.0–0.2)

## 2023-02-21 LAB — HCG, QUANTITATIVE, PREGNANCY: hCG, Beta Chain, Quant, S: 81769 m[IU]/mL — ABNORMAL HIGH (ref <5)

## 2023-02-21 NOTE — ED Triage Notes (Addendum)
Pt reports vag bleeding since yesterday.  No abd pain.  Pt is approx. [redacted] weeks Pregnant. Pt reports passing a blood clot today.  Pt alert.

## 2023-02-21 NOTE — ED Provider Notes (Signed)
   [facility] Provider Note    Event Date/Time   First MD Initiated Contact with Patient 02/21/23 1840     (approximate)  History   Chief Complaint: Vaginal Bleeding  HPI  Rebecca Walton is a 32 y.o. female with a past medical history of anemia, [redacted] weeks pregnant, presents to the emergency department for vaginal bleeding.  According to the patient today she has noted some vaginal spotting with a small blood clot.  Denies any abdominal cramping.  Denies any recent intercourse.  Patient has had 1 Walton miscarriage.  G3, P1 A1.  Physical Exam   Triage Vital Signs: ED Triage Vitals  Encounter Vitals Group     BP 02/21/23 1830 114/89     Systolic BP Percentile --      Diastolic BP Percentile --      Pulse Rate 02/21/23 1830 85     Resp 02/21/23 1830 18     Temp 02/21/23 1830 98 F (36.7 C)     Temp Source 02/21/23 1830 Oral     SpO2 02/21/23 1830 99 %     Weight 02/21/23 1826 230 lb (104.3 kg)     Height 02/21/23 1826 5' (1.524 m)     Head Circumference --      Peak Flow --      Pain Score 02/21/23 1826 0     Pain Loc --      Pain Education --      Exclude from Growth Chart --     Most recent vital signs: Vitals:   02/21/23 1830  BP: 114/89  Pulse: 85  Resp: 18  Temp: 98 F (36.7 C)  SpO2: 99%    General: Awake, no distress.  CV:  Good peripheral perfusion.  Regular rate and rhythm  Resp:  Normal effort.  Equal breath sounds bilaterally.  Abd:  No distention.  Soft, nontender.  No rebound or guarding.  ED Results / Procedures / Treatments   RADIOLOGY  OB ultrasound shows a 9-week intrauterine pregnancy with a fetal heart rate of 176 bpm.  Small subchorionic hematoma   MEDICATIONS ORDERED IN ED: Medications - No data to display   IMPRESSION / MDM / ASSESSMENT AND PLAN / ED COURSE  I reviewed the triage vital signs and the nursing notes.  Patient's presentation is most consistent with acute presentation with potential threat to life  or bodily function.  Patient presents to the emergency department for vaginal bleeding.  Patient is approximately [redacted] weeks pregnant.  Denies any abdominal pain or cramping.  Benign abdomen.  Reassuring exam.  Lab work is reassuring normal CBC reassuring chemistry.  Beta-hCG is pending.  Will obtain an ultrasound to further evaluate.  Patient agreeable to plan of care.  Patient's beta-hCG is 81,000, reassuring CBC and chemistry.  Ultrasound shows a 9-week intrauterine pregnancy with a normal fetal heart rate of 176 bpm.  There is a small subchorionic hemorrhage which very likely could explain the patient's intermittent spotting.  Discussed these results with the patient and she will follow-up with her OB/GYN.  Discussed return precautions patient agreeable to plan.  FINAL CLINICAL IMPRESSION(S) / ED DIAGNOSES   Threatened miscarriage  Note:  This document was prepared using Dragon voice recognition software and may include unintentional dictation errors.   Minna Antis, MD 02/21/23 2022

## 2023-02-21 NOTE — Discharge Instructions (Signed)
As we discussed your ultrasound shows reassuring results.  Please follow-up with your OB/GYN for recheck/reevaluation.  Return to the emergency department for any symptom concerning to yourself.

## 2023-02-21 NOTE — ED Notes (Signed)
Pt via POV from home, pt c/o vaginal bleeding that started yesterday. Denies any abd cramping, NVD, or fevers. Pt states she is approx [redacted] weeks pregnant. Pt is A&Ox4 and NAD, ambulatory to room from triage.

## 2023-02-21 NOTE — ED Notes (Signed)
Pt transported to [facility] at this time. 

## 2023-02-28 ENCOUNTER — Ambulatory Visit
Admission: RE | Admit: 2023-02-28 | Discharge: 2023-02-28 | Disposition: A | Discharge status: Home / Self Care | Payer: Medicaid Other | Source: Ambulatory Visit

## 2023-02-28 DIAGNOSIS — Z3687 Encounter for antenatal screening for uncertain dates: Secondary | ICD-10-CM | POA: Insufficient documentation

## 2023-02-28 DIAGNOSIS — Z3A10 10 weeks gestation of pregnancy: Secondary | ICD-10-CM | POA: Insufficient documentation

## 2023-02-28 DIAGNOSIS — Z369 Encounter for antenatal screening, unspecified: Secondary | ICD-10-CM

## 2023-02-28 DIAGNOSIS — Z3480 Encounter for supervision of other normal pregnancy, unspecified trimester: Secondary | ICD-10-CM

## 2023-03-14 ENCOUNTER — Ambulatory Visit (INDEPENDENT_AMBULATORY_CARE_PROVIDER_SITE_OTHER): Payer: Medicaid Other | Admitting: Obstetrics

## 2023-03-14 ENCOUNTER — Other Ambulatory Visit (HOSPITAL_COMMUNITY)
Admission: RE | Admit: 2023-03-14 | Discharge: 2023-03-14 | Disposition: A | Discharge status: Home / Self Care | Payer: Medicaid Other | Source: Ambulatory Visit | Attending: Obstetrics | Admitting: Obstetrics

## 2023-03-14 ENCOUNTER — Encounter: Payer: Self-pay | Admitting: Obstetrics

## 2023-03-14 VITALS — BP 118/74 | HR 105 | Wt 231.0 lb

## 2023-03-14 DIAGNOSIS — Z3481 Encounter for supervision of other normal pregnancy, first trimester: Secondary | ICD-10-CM | POA: Diagnosis not present

## 2023-03-14 DIAGNOSIS — Z3A12 12 weeks gestation of pregnancy: Secondary | ICD-10-CM | POA: Insufficient documentation

## 2023-03-14 DIAGNOSIS — Z98891 History of uterine scar from previous surgery: Secondary | ICD-10-CM | POA: Insufficient documentation

## 2023-03-14 DIAGNOSIS — D649 Anemia, unspecified: Secondary | ICD-10-CM

## 2023-03-14 DIAGNOSIS — Z1379 Encounter for other screening for genetic and chromosomal anomalies: Secondary | ICD-10-CM | POA: Insufficient documentation

## 2023-03-14 DIAGNOSIS — Z3480 Encounter for supervision of other normal pregnancy, unspecified trimester: Secondary | ICD-10-CM | POA: Diagnosis present

## 2023-03-14 DIAGNOSIS — Z113 Encounter for screening for infections with a predominantly sexual mode of transmission: Secondary | ICD-10-CM | POA: Insufficient documentation

## 2023-03-14 DIAGNOSIS — Z0283 Encounter for blood-alcohol and blood-drug test: Secondary | ICD-10-CM | POA: Diagnosis present

## 2023-03-14 DIAGNOSIS — Z0184 Encounter for antibody response examination: Secondary | ICD-10-CM

## 2023-03-14 DIAGNOSIS — Z309 Encounter for contraceptive management, unspecified: Secondary | ICD-10-CM | POA: Insufficient documentation

## 2023-03-14 DIAGNOSIS — Z131 Encounter for screening for diabetes mellitus: Secondary | ICD-10-CM | POA: Insufficient documentation

## 2023-03-14 DIAGNOSIS — Z3009 Encounter for other general counseling and advice on contraception: Secondary | ICD-10-CM

## 2023-03-14 DIAGNOSIS — Z6841 Body Mass Index (BMI) 40.0 and over, adult: Secondary | ICD-10-CM | POA: Insufficient documentation

## 2023-03-14 DIAGNOSIS — O09299 Supervision of pregnancy with other poor reproductive or obstetric history, unspecified trimester: Secondary | ICD-10-CM

## 2023-03-14 MED ORDER — ASPIRIN 81 MG PO TBEC: 162.0000 mg | DELAYED_RELEASE_TABLET | Freq: Every day | ORAL | 12 refills | Status: DC

## 2023-03-14 NOTE — Progress Notes (Signed)
NEW OB HISTORY AND PHYSICAL  SUBJECTIVE:       Shemicka Angerman is a 32 y.o. G75P1011 female, Patient's last menstrual period was 12/16/2022 (exact date)., Estimated Date of Delivery: 09/22/23, [redacted]w[redacted]d, presents today for establishment of Prenatal Care. She reports nausea. She has a h/o cesarean birth for arrest of dilation and chorioamnionitis.  Social history Partner/Relationship: FOB involved Living situation: lives with partner and 95-year-old Work: PCA Exercise: none outside of work Substance use: denies   Gynecologic History Patient's last menstrual period was 12/16/2022 (exact date).  She reports irregular periods but her last period was normal Contraception: none Last Pap: 11/30/20. Results were: normal  Obstetric History OB History  Gravida Para Term Preterm AB Living  3 1 1  0 1 1  SAB IAB Ectopic Multiple Live Births  1 0 0   1    # Outcome Date GA Lbr Len/2nd Weight Sex Type Anes PTL Lv  3 Current           2 SAB 2021          1 Term 07/01/17 [redacted]w[redacted]d  7 lb 14 oz (3.572 kg) F CS-LTranv  N LIV    Past Medical History:  Diagnosis Date   Anemia    History of methicillin resistant staphylococcus aureus (MRSA)    as a child on elbow   PONV (postoperative nausea and vomiting)    Vaginal Pap smear, abnormal    cryo 2021    Past Surgical History:  Procedure Laterality Date   CESAREAN SECTION N/A 06/29/2017   Procedure: CESAREAN SECTION;  Surgeon: Conard Novak, MD;  Location: [facility] ORS;  Service: Obstetrics;  Laterality: N/A;   CHOLECYSTECTOMY N/A 08/22/2017   Procedure: LAPAROSCOPIC CHOLECYSTECTOMY;  Surgeon: Ancil Linsey, MD;  Location: [facility] ORS;  Service: General;  Laterality: N/A;    Current Outpatient Medications on File Prior to Visit  Medication Sig Dispense Refill   Prenatal Vit-Fe Fumarate-FA (MULTIVITAMIN-PRENATAL) 27-0.8 MG TABS tablet Take 1 tablet by mouth daily at 12 noon. 100 tablet 0   ferrous sulfate 324 MG TBEC Take 324 mg by mouth daily. Pt unsure  of mg takes 1 tablet daily (Patient not taking: Reported on 11/30/2020)     No current facility-administered medications on file prior to visit.    Allergies  Allergen Reactions   Coconut Flavor Swelling    Throat swells and closes.    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  Tobacco Use   Smoking status: Never   Smokeless tobacco: Never  Vaping Use   Vaping status: Never Used  Substance and Sexual Activity   Alcohol use: Not Currently    Comment: last use 01/10/23  "smirnoff" drinks x 3   Drug use: Never   Sexual activity: Yes    Partners: Male    Birth control/protection: None    Comment: hx ocp 5 yrs ago  Other Topics Concern   Not on file  Social History Narrative   ** Merged History Encounter **       Social Determinants of Health   Financial Resource Strain: Low Risk  (02/08/2023)   Overall Financial Resource Strain (CARDIA)    Difficulty of Paying Living Expenses: Not hard at all  Food Insecurity: No Food  Insecurity (02/08/2023)   Hunger Vital Sign    Worried About Running Out of Food in the Last Year: Never true    Ran Out of Food in the Last Year: Never true  Transportation Needs: No Transportation Needs (02/08/2023)   PRAPARE - Administrator, Civil Service (Medical): No    Lack of Transportation (Non-Medical): No  Physical Activity: Inactive (02/08/2023)   Exercise Vital Sign    Days of Exercise per Week: 0 days    Minutes of Exercise per Session: 0 min  Stress: No Stress Concern Present (02/08/2023)   [employer] of Occupational Health - Occupational Stress Questionnaire    Feeling of Stress : Not at all  Social Connections: Moderately Isolated (02/08/2023)   Social Connection and Isolation Panel [NHANES]    Frequency of Communication  with Friends and Family: More than three times a week    Frequency of Social Gatherings with Friends and Family: Twice a week    Attends Religious Services: Never    Database administrator or Organizations: No    Attends Banker Meetings: Never    Marital Status: Married  Catering manager Violence: Not At Risk (02/08/2023)   Humiliation, Afraid, Rape, and Kick questionnaire    Fear of Current or Ex-Partner: No    Emotionally Abused: No    Physically Abused: No    Sexually Abused: No    Family History  Problem Relation Age of Onset   Anemia Mother    Healthy Father    Asthma Sister    Anemia Sister    Healthy Sister    Healthy Brother    Healthy Maternal Grandmother    Diabetes Maternal Grandfather    Diabetes Paternal Grandmother    Healthy Paternal Grandfather    Epilepsy Maternal Aunt    Cerebral palsy Maternal Aunt     The following portions of the patient's history were reviewed and updated as appropriate: allergies, current medications, past OB history, past medical history, past surgical history, past family history, past social history, and problem list.  Constitutional: Denied constitutional symptoms, night sweats, recent illness, fatigue, fever, insomnia and weight loss.  Eyes: Denied eye symptoms, eye pain, photophobia, vision change and visual disturbance.  Ears/Nose/Throat/Neck: Denied ear, nose, throat or neck symptoms, hearing loss, nasal discharge, sinus congestion and sore throat.  Cardiovascular: Denied cardiovascular symptoms, arrhythmia, chest pain/pressure, edema, exercise intolerance, orthopnea and palpitations.  Respiratory: Denied pulmonary symptoms, asthma, pleuritic pain, productive sputum, cough, dyspnea and wheezing.  Gastrointestinal: Denied, gastro-esophageal reflux, melena, and vomiting. +nausea  Genitourinary: Denied genitourinary symptoms including symptomatic vaginal discharge, pelvic relaxation issues, and urinary complaints.   Musculoskeletal: Denied musculoskeletal symptoms, stiffness, swelling, muscle weakness and myalgia.  Dermatologic: Denied dermatology symptoms, rash and scar.  Neurologic: Denied neurology symptoms, dizziness, headache, neck pain and syncope.  Psychiatric: Denied psychiatric symptoms, anxiety and depression.  Endocrine: Denied endocrine symptoms including hot flashes and night sweats.     OBJECTIVE: Initial Physical Exam (New OB)  GENERAL APPEARANCE: alert, well appearing HEAD: normocephalic, atraumatic MOUTH: mucous membranes moist, pharynx normal without lesions THYROID: no thyromegaly or masses present BREASTS: declined LUNGS: clear to auscultation, no wheezes, rales or rhonchi, symmetric air entry HEART: regular rate and rhythm, no murmurs ABDOMEN: soft, nontender, nondistended, no abnormal masses, no epigastric pain, FHT present, and fundus soft, above SP EXTREMITIES: no redness or tenderness in the calves or thighs SKIN: normal coloration and turgor, no rashes LYMPH NODES: no adenopathy palpable NEUROLOGIC: alert, oriented,  normal speech, no focal findings or movement disorder noted  PELVIC EXAM declined  ASSESSMENT: Normal pregnancy [redacted]w[redacted]d Desires repeat CS BMI>45   PLAN: Routine prenatal care. We discussed an overview of prenatal care and when to call. Reviewed diet, exercise, and weight gain recommendations in pregnancy. Discussed benefits of breastfeeding and lactation resources at [facility] Labs and genetic screening today. Discussed increased antenatal screening d/t BMI>45, including 3rd-trimester growth scan and weekly NSTs at 34 weeks. MFM anatomy scan at 20 weeks ordered. See orders  Guadlupe Spanish, CNM

## 2023-03-15 ENCOUNTER — Telehealth: Payer: Self-pay

## 2023-03-15 ENCOUNTER — Other Ambulatory Visit: Payer: Self-pay

## 2023-03-15 DIAGNOSIS — Z131 Encounter for screening for diabetes mellitus: Secondary | ICD-10-CM

## 2023-03-15 LAB — CBC/D/PLT+RPR+RH+ABO+RUBIGG...
Antibody Screen: NEGATIVE
Basophils Absolute: 0 10*3/uL (ref 0.0–0.2)
Basos: 0 %
EOS (ABSOLUTE): 0.1 10*3/uL (ref 0.0–0.4)
Eos: 1 %
HCV Ab: NONREACTIVE
HIV Screen 4th Generation wRfx: NONREACTIVE
Hematocrit: 33.7 % — ABNORMAL LOW (ref 34.0–46.6)
Hemoglobin: 10.5 g/dL — ABNORMAL LOW (ref 11.1–15.9)
Hepatitis B Surface Ag: NEGATIVE
Immature Grans (Abs): 0 10*3/uL (ref 0.0–0.1)
Immature Granulocytes: 0 %
Lymphocytes Absolute: 1.8 10*3/uL (ref 0.7–3.1)
Lymphs: 23 %
MCH: 24.2 pg — ABNORMAL LOW (ref 26.6–33.0)
MCHC: 31.2 g/dL — ABNORMAL LOW (ref 31.5–35.7)
MCV: 78 fL — ABNORMAL LOW (ref 79–97)
Monocytes Absolute: 0.4 10*3/uL (ref 0.1–0.9)
Monocytes: 5 %
Neutrophils Absolute: 5.6 10*3/uL (ref 1.4–7.0)
Neutrophils: 71 %
Platelets: 240 10*3/uL (ref 150–450)
RBC: 4.34 x10E6/uL (ref 3.77–5.28)
RDW: 14.4 % (ref 11.7–15.4)
RPR Ser Ql: NONREACTIVE
Rh Factor: POSITIVE
Rubella Antibodies, IGG: <0.9 {index} — ABNORMAL LOW (ref >0.99)
Varicella zoster IgG: 219 {index} (ref >165)
WBC: 7.9 10*3/uL (ref 3.4–10.8)

## 2023-03-15 LAB — URINE CYTOLOGY ANCILLARY ONLY
Chlamydia: NEGATIVE
Comment: NEGATIVE
Comment: NORMAL
Neisseria Gonorrhea: NEGATIVE

## 2023-03-15 LAB — URINALYSIS, ROUTINE W REFLEX MICROSCOPIC
Bilirubin, UA: NEGATIVE
Glucose, UA: NEGATIVE
Leukocytes,UA: NEGATIVE
Nitrite, UA: NEGATIVE
RBC, UA: NEGATIVE
Specific Gravity, UA: >=1.03 — AB (ref 1.005–1.030)
Urobilinogen, Ur: 0.2 mg/dL (ref 0.2–1.0)
pH, UA: 5.5 (ref 5.0–7.5)

## 2023-03-15 LAB — HCV INTERPRETATION

## 2023-03-15 LAB — HEMOGLOBIN A1C
Est. average glucose Bld gHb Est-mCnc: 120 mg/dL
Hgb A1c MFr Bld: 5.8 % — ABNORMAL HIGH (ref 4.8–5.6)

## 2023-03-15 NOTE — Telephone Encounter (Signed)
I contacted the patient via phone. I left voicemail asking the patient to call the office to scheduled her one hour glucose appointment.

## 2023-03-16 LAB — MONITOR DRUG PROFILE 14(MW)
Amphetamine Scrn, Ur: NEGATIVE ng/mL
BARBITURATE SCREEN URINE: NEGATIVE ng/mL
BENZODIAZEPINE SCREEN, URINE: NEGATIVE ng/mL
Buprenorphine, Urine: NEGATIVE ng/mL
CANNABINOIDS UR QL SCN: NEGATIVE ng/mL
Cocaine (Metab) Scrn, Ur: NEGATIVE ng/mL
Creatinine(Crt), U: 189.7 mg/dL (ref 20.0–300.0)
Fentanyl, Urine: NEGATIVE pg/mL
Meperidine Screen, Urine: NEGATIVE ng/mL
Methadone Screen, Urine: NEGATIVE ng/mL
OXYCODONE+OXYMORPHONE UR QL SCN: NEGATIVE ng/mL
Opiate Scrn, Ur: NEGATIVE ng/mL
Ph of Urine: 5.3 (ref 4.5–8.9)
Phencyclidine Qn, Ur: NEGATIVE ng/mL
Propoxyphene Scrn, Ur: NEGATIVE ng/mL
SPECIFIC GRAVITY: 1.025
Tramadol Screen, Urine: NEGATIVE ng/mL

## 2023-03-16 LAB — NICOTINE SCREEN, URINE: Cotinine Ql Scrn, Ur: NEGATIVE ng/mL

## 2023-03-16 LAB — CULTURE, OB URINE

## 2023-03-16 LAB — URINE CULTURE, OB REFLEX

## 2023-03-17 NOTE — Telephone Encounter (Signed)
The patient contacted  the office, she is scheduled for 9/3 for glucose appointment.

## 2023-03-18 LAB — MATERNIT 21 PLUS CORE, BLOOD
Fetal Fraction: 4
Result (T21): NEGATIVE
Trisomy 13 (Patau syndrome): NEGATIVE
Trisomy 18 (Edwards syndrome): NEGATIVE
Trisomy 21 (Down syndrome): NEGATIVE

## 2023-03-21 ENCOUNTER — Other Ambulatory Visit: Payer: Medicaid Other

## 2023-03-21 DIAGNOSIS — Z131 Encounter for screening for diabetes mellitus: Secondary | ICD-10-CM

## 2023-03-22 ENCOUNTER — Telehealth: Payer: Self-pay

## 2023-03-22 LAB — GLUCOSE, 1 HOUR GESTATIONAL: Gestational Diabetes Screen: 135 mg/dL (ref 70–139)

## 2023-03-23 ENCOUNTER — Encounter: Payer: Self-pay | Admitting: Obstetrics

## 2023-03-28 ENCOUNTER — Telehealth: Payer: Self-pay

## 2023-03-28 NOTE — Telephone Encounter (Signed)
Detail, Anatomy ultrasound scheduled for 10/14 at [facility].  Left message for patient to call office to reschedule if can not make 7:45am appointment time.

## 2023-04-11 ENCOUNTER — Ambulatory Visit (INDEPENDENT_AMBULATORY_CARE_PROVIDER_SITE_OTHER): Payer: Medicaid Other | Admitting: Certified Nurse Midwife

## 2023-04-11 VITALS — BP 115/75 | HR 86 | Wt 229.6 lb

## 2023-04-11 DIAGNOSIS — Z3A16 16 weeks gestation of pregnancy: Secondary | ICD-10-CM

## 2023-04-11 DIAGNOSIS — Z3481 Encounter for supervision of other normal pregnancy, first trimester: Secondary | ICD-10-CM

## 2023-04-11 LAB — POCT URINALYSIS DIPSTICK OB
Bilirubin, UA: NEGATIVE
Blood, UA: NEGATIVE
Glucose, UA: NEGATIVE
Ketones, UA: NEGATIVE
Leukocytes, UA: NEGATIVE
Nitrite, UA: NEGATIVE
POC,PROTEIN,UA: NEGATIVE
Spec Grav, UA: 1.025 (ref 1.010–1.025)
Urobilinogen, UA: 0.2 E.U./dL
pH, UA: 5 (ref 5.0–8.0)

## 2023-04-11 NOTE — Progress Notes (Signed)
ROB doing well , having some fluttering.  Has anatomy u/s scheduled with MFM. Discussed early glucose results and given just in normal limit. Encouraged health low carbohydrate diet and exercise. PT offered referral to dietician pt declines she feels that she is able to manage on her own.   Follow up 4 wks for ROB  Doreene Burke, CNM

## 2023-04-24 DIAGNOSIS — O99210 Obesity complicating pregnancy, unspecified trimester: Secondary | ICD-10-CM | POA: Insufficient documentation

## 2023-04-24 DIAGNOSIS — O208 Other hemorrhage in early pregnancy: Secondary | ICD-10-CM | POA: Insufficient documentation

## 2023-05-01 ENCOUNTER — Ambulatory Visit: Payer: Medicaid Other

## 2023-05-02 ENCOUNTER — Encounter: Payer: Self-pay | Admitting: *Deleted

## 2023-05-02 ENCOUNTER — Ambulatory Visit: Payer: Medicaid Other | Attending: Obstetrics

## 2023-05-02 ENCOUNTER — Ambulatory Visit: Payer: Medicaid Other | Admitting: *Deleted

## 2023-05-02 ENCOUNTER — Other Ambulatory Visit: Payer: Self-pay | Admitting: *Deleted

## 2023-05-02 VITALS — BP 121/56 | HR 87

## 2023-05-02 DIAGNOSIS — O35DXX0 Maternal care for other (suspected) fetal abnormality and damage, fetal gastrointestinal anomalies, not applicable or unspecified: Secondary | ICD-10-CM | POA: Diagnosis not present

## 2023-05-02 DIAGNOSIS — J45909 Unspecified asthma, uncomplicated: Secondary | ICD-10-CM | POA: Insufficient documentation

## 2023-05-02 DIAGNOSIS — Z362 Encounter for other antenatal screening follow-up: Secondary | ICD-10-CM

## 2023-05-02 DIAGNOSIS — Z6841 Body Mass Index (BMI) 40.0 and over, adult: Secondary | ICD-10-CM | POA: Insufficient documentation

## 2023-05-02 DIAGNOSIS — O34219 Maternal care for unspecified type scar from previous cesarean delivery: Secondary | ICD-10-CM | POA: Diagnosis not present

## 2023-05-02 DIAGNOSIS — Z3480 Encounter for supervision of other normal pregnancy, unspecified trimester: Secondary | ICD-10-CM

## 2023-05-02 DIAGNOSIS — O98512 Other viral diseases complicating pregnancy, second trimester: Secondary | ICD-10-CM | POA: Diagnosis not present

## 2023-05-02 DIAGNOSIS — O99512 Diseases of the respiratory system complicating pregnancy, second trimester: Secondary | ICD-10-CM | POA: Insufficient documentation

## 2023-05-02 DIAGNOSIS — E669 Obesity, unspecified: Secondary | ICD-10-CM

## 2023-05-02 DIAGNOSIS — O99282 Endocrine, nutritional and metabolic diseases complicating pregnancy, second trimester: Secondary | ICD-10-CM | POA: Diagnosis not present

## 2023-05-02 DIAGNOSIS — O208 Other hemorrhage in early pregnancy: Secondary | ICD-10-CM

## 2023-05-02 DIAGNOSIS — O99210 Obesity complicating pregnancy, unspecified trimester: Secondary | ICD-10-CM | POA: Insufficient documentation

## 2023-05-02 DIAGNOSIS — O209 Hemorrhage in early pregnancy, unspecified: Secondary | ICD-10-CM | POA: Diagnosis not present

## 2023-05-02 DIAGNOSIS — Z3A19 19 weeks gestation of pregnancy: Secondary | ICD-10-CM | POA: Insufficient documentation

## 2023-05-02 DIAGNOSIS — O321XX0 Maternal care for breech presentation, not applicable or unspecified: Secondary | ICD-10-CM | POA: Diagnosis not present

## 2023-05-02 DIAGNOSIS — O99212 Obesity complicating pregnancy, second trimester: Secondary | ICD-10-CM | POA: Insufficient documentation

## 2023-05-09 ENCOUNTER — Encounter: Payer: Medicaid Other | Admitting: Licensed Practical Nurse

## 2023-05-09 DIAGNOSIS — Z3A20 20 weeks gestation of pregnancy: Secondary | ICD-10-CM

## 2023-05-09 DIAGNOSIS — Z3481 Encounter for supervision of other normal pregnancy, first trimester: Secondary | ICD-10-CM

## 2023-05-11 ENCOUNTER — Ambulatory Visit (INDEPENDENT_AMBULATORY_CARE_PROVIDER_SITE_OTHER): Payer: Medicaid Other | Admitting: Licensed Practical Nurse

## 2023-05-11 ENCOUNTER — Encounter: Payer: Self-pay | Admitting: Licensed Practical Nurse

## 2023-05-11 VITALS — BP 111/75 | HR 98 | Wt 234.2 lb

## 2023-05-11 DIAGNOSIS — R399 Unspecified symptoms and signs involving the genitourinary system: Secondary | ICD-10-CM

## 2023-05-11 DIAGNOSIS — Z3A20 20 weeks gestation of pregnancy: Secondary | ICD-10-CM

## 2023-05-11 DIAGNOSIS — Z3480 Encounter for supervision of other normal pregnancy, unspecified trimester: Secondary | ICD-10-CM

## 2023-05-11 DIAGNOSIS — Z3481 Encounter for supervision of other normal pregnancy, first trimester: Secondary | ICD-10-CM

## 2023-05-11 LAB — POCT URINALYSIS DIPSTICK
Bilirubin, UA: NEGATIVE
Blood, UA: NEGATIVE
Glucose, UA: NEGATIVE
Ketones, UA: NEGATIVE
Leukocytes, UA: NEGATIVE
Nitrite, UA: NEGATIVE
Protein, UA: NEGATIVE
Spec Grav, UA: 1.015 (ref 1.010–1.025)
Urobilinogen, UA: 0.2 U/dL
pH, UA: 6.5 (ref 5.0–8.0)

## 2023-05-11 NOTE — Progress Notes (Signed)
Routine Prenatal Care Visit  Subjective  Rebecca Walton is a 32 y.o. G3P1011 at [redacted]w[redacted]d being seen today for ongoing prenatal care.  She is currently monitored for the following issues for this high-risk pregnancy and has Mild intellectual disability; Asthma; Supervision of other normal pregnancy, antepartum; H/O cesarean section; H/O postpartum hemorrhage, currently pregnant; BMI 45.0-49.9, adult (HCC); Subchorionic hemorrhage in first trimester; and Obesity affecting pregnancy on their problem list.  ----------------------------------------------------------------------------------- Patient reports  urinary urgency x 2 days, when she gets to the BR, no urine comes out, she did see blood in the urine with some small blood clots on Tuesday .  She increased her fluid intake and has been urinating without difficulty since increasing hydration.Denies any fevers, has had a little low back pain. Last had IC over a week ago   -Here with Husband Clifton Custard -They have a 37 year old at home, he also has a 58 y/o son.   Contractions: Not present. Vag. Bleeding: Small.  Movement: Present. Leaking Fluid denies.  ----------------------------------------------------------------------------------- The following portions of the patient's history were reviewed and updated as appropriate: allergies, current medications, past family history, past medical history, past social history, past surgical history and problem list. Problem list updated.  Objective  Blood pressure 111/75, pulse 98, weight 234 lb 3.2 oz (106.2 kg), last menstrual period 12/16/2022. Pregravid weight 230 lb (104.3 kg) Total Weight Gain 4 lb 3.2 oz (1.905 kg) Urinalysis: Urine Protein    Urine Glucose    Fetal Status: Fetal Heart Rate (bpm): 140   Movement: Present     General:  Alert, oriented and cooperative. Patient is in no acute distress.  Skin: Skin is warm and dry. No rash noted.   Cardiovascular: Normal heart rate noted  Respiratory: Normal  respiratory effort, no problems with respiration noted  Abdomen: Soft, gravid, appropriate for gestational age. Pain/Pressure: Absent     Pelvic:  Cervical exam deferred        Extremities: Normal range of motion.  Edema: Mild pitting, slight indentation  Mental Status: Normal mood and affect. Normal behavior. Normal judgment and thought content.   Assessment   32 y.o. G3P1011 at [redacted]w[redacted]d by  09/22/2023, by Last Menstrual Period presenting for routine prenatal visit  Plan   July 2024 Problems (from 02/08/23 to present)     Problem Noted Resolved   Supervision of other normal pregnancy, antepartum 02/08/2023 by Loran Senters, CMA No   Overview Addendum 05/11/2023  9:57 AM by Burtis Junes, CMA     Clinical Staff Provider  Office Location   [facility] Dating  Not found.  Language  English Anatomy US    Flu Vaccine  declined Genetic Screen  NIPS:   TDaP vaccine   offer Hgb A1C or  GTT Early : Third trimester :   Covid declined   LAB RESULTS   Rhogam  --/--/A POS Performed at [facility], [address] [place], [place] [address]  [phone])  Blood Type --/--/A POS Performed at [facility], [address] [place], [place] [address]  [phone])   Feeding Plan breast Antibody    Contraception undecided Rubella    Circumcision yes RPR     Pediatrician  Phineas Real HBsAg     Support Person Clifton Custard HIV    Prenatal Classes no Varicella     GBS  (For PCN allergy, check sensitivities)   BTL Consent  Hep C     VBAC Consent  Pap Diagnosis  Date Value  Ref Range Status  11/30/2020   Final   - Negative for intraepithelial lesion or malignancy (NILM)      Hgb Electro      CF      SMA                    Preterm labor symptoms and general obstetric precautions including but not limited to vaginal bleeding, contractions, leaking of fluid and fetal movement were reviewed in detail with the patient. Please refer to After Visit Summary for other counseling  recommendations.   Return in about 4 weeks (around 06/08/2023) for ROB.  Urine culture sent   Carie Caddy, Ina Homes  [facility]  05/11/23  10:41 AM

## 2023-05-15 LAB — URINE CULTURE

## 2023-05-30 ENCOUNTER — Other Ambulatory Visit: Payer: Self-pay | Admitting: Licensed Practical Nurse

## 2023-05-30 ENCOUNTER — Other Ambulatory Visit: Payer: Self-pay

## 2023-05-30 ENCOUNTER — Ambulatory Visit: Payer: Medicaid Other | Attending: Maternal & Fetal Medicine

## 2023-05-30 ENCOUNTER — Other Ambulatory Visit: Payer: Self-pay | Admitting: *Deleted

## 2023-05-30 DIAGNOSIS — Z362 Encounter for other antenatal screening follow-up: Secondary | ICD-10-CM

## 2023-05-30 DIAGNOSIS — O35DXX0 Maternal care for other (suspected) fetal abnormality and damage, fetal gastrointestinal anomalies, not applicable or unspecified: Secondary | ICD-10-CM | POA: Insufficient documentation

## 2023-05-30 DIAGNOSIS — O99512 Diseases of the respiratory system complicating pregnancy, second trimester: Secondary | ICD-10-CM | POA: Diagnosis not present

## 2023-05-30 DIAGNOSIS — Z6841 Body Mass Index (BMI) 40.0 and over, adult: Secondary | ICD-10-CM

## 2023-05-30 DIAGNOSIS — J45909 Unspecified asthma, uncomplicated: Secondary | ICD-10-CM

## 2023-05-30 DIAGNOSIS — O358XX0 Maternal care for other (suspected) fetal abnormality and damage, not applicable or unspecified: Secondary | ICD-10-CM

## 2023-05-30 DIAGNOSIS — O99212 Obesity complicating pregnancy, second trimester: Secondary | ICD-10-CM | POA: Diagnosis not present

## 2023-05-30 DIAGNOSIS — O0990 Supervision of high risk pregnancy, unspecified, unspecified trimester: Secondary | ICD-10-CM

## 2023-05-30 DIAGNOSIS — O34219 Maternal care for unspecified type scar from previous cesarean delivery: Secondary | ICD-10-CM | POA: Diagnosis not present

## 2023-05-30 DIAGNOSIS — Z3A23 23 weeks gestation of pregnancy: Secondary | ICD-10-CM

## 2023-05-30 NOTE — Progress Notes (Signed)
BMI 45 ref to anesthesia placed Carie Caddy, CNM   [facility]  05/30/23  1:16 PM

## 2023-06-07 NOTE — Progress Notes (Unsigned)
       Routine Prenatal Care Visit  Subjective  Rebecca Walton is a 33 y.o. G3P1011 at [redacted]w[redacted]d being seen today for ongoing prenatal care.  She is currently monitored for the following issues for this {Blank single:19197::"high-risk","low-risk"} pregnancy and has Mild intellectual disability; Asthma; Supervision of other normal pregnancy, antepartum; H/O cesarean section; H/O postpartum hemorrhage, currently pregnant; BMI 45.0-49.9, adult (HCC); Subchorionic hemorrhage in first trimester; and Obesity affecting pregnancy on their problem list.  ----------------------------------------------------------------------------------- Patient reports {sx:14538}.    .  .   Pincus Large Fluid {Actions; denies/reports/admits to:19208}.  ----------------------------------------------------------------------------------- The following portions of the patient's history were reviewed and updated as appropriate: allergies, current medications, past family history, past medical history, past social history, past surgical history and problem list. Problem list updated.  Objective  Last menstrual period 12/16/2022. Pregravid weight 230 lb (104.3 kg) Total Weight Gain 4 lb 3.2 oz (1.905 kg) Urinalysis: Urine Protein    Urine Glucose    Fetal Status:           General:  Alert, oriented and cooperative. Patient is in no acute distress.  Skin: Skin is warm and dry. No rash noted.   Cardiovascular: Normal heart rate noted  Respiratory: Normal respiratory effort, no problems with respiration noted  Abdomen: Soft, gravid, appropriate for gestational age.       Pelvic:  {Blank single:19197::"Cervical exam performed","Cervical exam deferred"}        Extremities: Normal range of motion.     Mental Status: Normal mood and affect. Normal behavior. Normal judgment and thought content.   Assessment   32 y.o. G3P1011 at [redacted]w[redacted]d by  09/22/2023, by Last Menstrual Period presenting for {Blank single:19197::"routine","work-in"}  prenatal visit  Plan   July 2024 Problems (from 02/08/23 to present)     Problem Noted Resolved   Supervision of other normal pregnancy, antepartum 02/08/2023 by Loran Senters, CMA No   Overview Addendum 05/11/2023 10:40 AM by Ellwood Sayers, CNM     Clinical Staff Provider  Office Location  [facility] Dating  LMP  Language  English Anatomy US    Flu Vaccine  declined Genetic Screen  NIPS: neg, female   TDaP vaccine   offer Hgb A1C or  GTT Early :135 Third trimester :   Covid declined   LAB RESULTS   Rhogam  A/Positive/-- (08/27 1038)  Blood Type A/Positive/-- (08/27 1038)   Feeding Plan breast Antibody Negative (08/27 1038)  Contraception undecided Rubella <0.90 (08/27 1038)  Circumcision yes RPR Non Reactive (08/27 1038)   Pediatrician  Phineas Real HBsAg Negative (08/27 1038)   Support Person Clifton Custard HIV Non Reactive (08/27 1038)  Prenatal Classes no Varicella Immune    GBS  (For PCN allergy, check sensitivities)   BTL Consent  Hep C Non Reactive (08/27 1038)   VBAC Consent  Pap Diagnosis  Date Value Ref Range Status  11/30/2020   Final   - Negative for intraepithelial lesion or malignancy (NILM)      Hgb Electro      CF      SMA                    {Blank single:19197::"Term","Preterm"} labor symptoms and general obstetric precautions including but not limited to vaginal bleeding, contractions, leaking of fluid and fetal movement were reviewed in detail with the patient. Please refer to After Visit Summary for other counseling recommendations.   No follow-ups on file.  @MYSIGNATURE @

## 2023-06-08 ENCOUNTER — Ambulatory Visit (INDEPENDENT_AMBULATORY_CARE_PROVIDER_SITE_OTHER): Payer: Medicaid Other | Admitting: Certified Nurse Midwife

## 2023-06-08 VITALS — BP 109/74 | HR 92 | Wt 235.4 lb

## 2023-06-08 DIAGNOSIS — Z3A24 24 weeks gestation of pregnancy: Secondary | ICD-10-CM

## 2023-06-08 DIAGNOSIS — O0992 Supervision of high risk pregnancy, unspecified, second trimester: Secondary | ICD-10-CM

## 2023-06-08 NOTE — Patient Instructions (Signed)
Oral Glucose Tolerance Test During Pregnancy Why am I having this test? The oral glucose tolerance test (OGTT) is done to check how your body processes blood sugar (glucose). This is one of several tests used to diagnose diabetes that develops during pregnancy (gestational diabetes mellitus). Gestational diabetes is a short-term form of diabetes that some women develop while they are pregnant. It usually occurs during the second trimester of pregnancy and goes away after delivery. Testing, or screening, for gestational diabetes usually occurs at weeks 24-28 of pregnancy. You may have the OGTT test after having a 1-hour glucose screening test if the results from that test indicate that you may have gestational diabetes. This test may also be needed if: You have a history of gestational diabetes. There is a history of giving birth to very large babies or of losing pregnancies (having stillbirths). You have signs and symptoms of diabetes, such as: Changes in your eyesight. Tingling or numbness in your hands or feet. Changes in hunger, thirst, and urination, and these are not explained by your pregnancy. What is being tested? This test measures the amount of glucose in your blood at different times during a period of 3 hours. This shows how well your body can process glucose. What kind of sample is taken?  Blood samples are required for this test. They are usually collected by inserting a needle into a blood vessel. How do I prepare for this test? For 3 days before your test, eat normally. Have plenty of carbohydrate-rich foods. Follow instructions from your health care provider about: Eating or drinking restrictions on the day of the test. You may be asked not to eat or drink anything other than water (to fast) starting 8-10 hours before the test. Changing or stopping your regular medicines. Some medicines may interfere with this test. Tell a health care provider about: All medicines you are  taking, including vitamins, herbs, eye drops, creams, and over-the-counter medicines. Any blood disorders you have. Any surgeries you have had. Any medical conditions you have. What happens during the test? First, your blood glucose will be measured. This is referred to as your fasting blood glucose because you fasted before the test. Then, you will drink a glucose solution that contains a certain amount of glucose. Your blood glucose will be measured again 1, 2, and 3 hours after you drink the solution. This test takes about 3 hours to complete. You will need to stay at the testing location during this time. During the testing period: Do not eat or drink anything other than the glucose solution. Do not exercise. Do not use any products that contain nicotine or tobacco, such as cigarettes, e-cigarettes, and chewing tobacco. These can affect your test results. If you need help quitting, ask your health care provider. The testing procedure may vary among health care providers and hospitals. How are the results reported? Your results will be reported as milligrams of glucose per deciliter of blood (mg/dL) or millimoles per liter (mmol/L). There is more than one source for screening and diagnosis reference values used to diagnose gestational diabetes. Your health care provider will compare your results to normal values that were established after testing a large group of people (reference values). Reference values may vary among labs and hospitals. For this test (Carpenter-Coustan), reference values are: Fasting: 95 mg/dL (5.3 mmol/L). 1 hour: 180 mg/dL (09.8 mmol/L). 2 hour: 155 mg/dL (8.6 mmol/L). 3 hour: 140 mg/dL (7.8 mmol/L). What do the results mean? Results below the reference values are  considered normal. If two or more of your blood glucose levels are at or above the reference values, you may be diagnosed with gestational diabetes. If only one level is high, your health care provider may  suggest repeat testing or other tests to confirm a diagnosis. Talk with your health care provider about what your results mean. Questions to ask your health care provider Ask your health care provider, or the department that is doing the test: When will my results be ready? How will I get my results? What are my treatment options? What other tests do I need? What are my next steps? Summary The oral glucose tolerance test (OGTT) is one of several tests used to diagnose diabetes that develops during pregnancy (gestational diabetes mellitus). Gestational diabetes is a short-term form of diabetes that some women develop while they are pregnant. You may have the OGTT test after having a 1-hour glucose screening test if the results from that test show that you may have gestational diabetes. You may also have this test if you have any symptoms or risk factors for this type of diabetes. Talk with your health care provider about what your results mean. This information is not intended to replace advice given to you by your health care provider. Make sure you discuss any questions you have with your health care provider. Document Revised: 02/08/2022 Document Reviewed: 12/12/2019 Elsevier Patient Education  2024 [employer].

## 2023-06-20 ENCOUNTER — Other Ambulatory Visit: Payer: Medicaid Other

## 2023-07-04 ENCOUNTER — Other Ambulatory Visit: Payer: Self-pay

## 2023-07-04 ENCOUNTER — Ambulatory Visit: Payer: Medicaid Other | Attending: Maternal & Fetal Medicine

## 2023-07-04 DIAGNOSIS — O34219 Maternal care for unspecified type scar from previous cesarean delivery: Secondary | ICD-10-CM

## 2023-07-04 DIAGNOSIS — O99213 Obesity complicating pregnancy, third trimester: Secondary | ICD-10-CM | POA: Diagnosis not present

## 2023-07-04 DIAGNOSIS — E669 Obesity, unspecified: Secondary | ICD-10-CM

## 2023-07-04 DIAGNOSIS — Z362 Encounter for other antenatal screening follow-up: Secondary | ICD-10-CM | POA: Insufficient documentation

## 2023-07-04 DIAGNOSIS — J45909 Unspecified asthma, uncomplicated: Secondary | ICD-10-CM | POA: Diagnosis not present

## 2023-07-04 DIAGNOSIS — O99513 Diseases of the respiratory system complicating pregnancy, third trimester: Secondary | ICD-10-CM

## 2023-07-04 DIAGNOSIS — Z3A28 28 weeks gestation of pregnancy: Secondary | ICD-10-CM

## 2023-07-05 ENCOUNTER — Encounter: Payer: Self-pay | Admitting: Obstetrics and Gynecology

## 2023-07-05 ENCOUNTER — Ambulatory Visit (INDEPENDENT_AMBULATORY_CARE_PROVIDER_SITE_OTHER): Payer: Medicaid Other | Admitting: Obstetrics and Gynecology

## 2023-07-05 ENCOUNTER — Other Ambulatory Visit: Payer: Self-pay | Admitting: *Deleted

## 2023-07-05 ENCOUNTER — Other Ambulatory Visit: Payer: Medicaid Other

## 2023-07-05 ENCOUNTER — Other Ambulatory Visit: Payer: Self-pay

## 2023-07-05 VITALS — BP 119/74 | HR 103 | Wt 245.4 lb

## 2023-07-05 DIAGNOSIS — Z3A28 28 weeks gestation of pregnancy: Secondary | ICD-10-CM

## 2023-07-05 DIAGNOSIS — Z130 Encounter for screening for diseases of the blood and blood-forming organs and certain disorders involving the immune mechanism: Secondary | ICD-10-CM

## 2023-07-05 DIAGNOSIS — Z98891 History of uterine scar from previous surgery: Secondary | ICD-10-CM

## 2023-07-05 DIAGNOSIS — O99213 Obesity complicating pregnancy, third trimester: Secondary | ICD-10-CM

## 2023-07-05 DIAGNOSIS — Z23 Encounter for immunization: Secondary | ICD-10-CM

## 2023-07-05 DIAGNOSIS — O0992 Supervision of high risk pregnancy, unspecified, second trimester: Secondary | ICD-10-CM

## 2023-07-05 DIAGNOSIS — Z113 Encounter for screening for infections with a predominantly sexual mode of transmission: Secondary | ICD-10-CM

## 2023-07-05 DIAGNOSIS — Z131 Encounter for screening for diabetes mellitus: Secondary | ICD-10-CM

## 2023-07-05 DIAGNOSIS — O99210 Obesity complicating pregnancy, unspecified trimester: Secondary | ICD-10-CM

## 2023-07-05 NOTE — Progress Notes (Signed)
ROB. Patient states daily fetal movement. Patient no showed OB anes consult, lvm with pre-admit to reschedule. Growth ultrasound completed 12/17. She completed GCT, declined TDAP injection and signed BTC today. Patient states no questions or concerns at this time.

## 2023-07-05 NOTE — Progress Notes (Signed)
ROB: 1 hour GCT today.  Reports very active baby.  She missed her anesthesia appointment for BMI-rescheduled.  Stressed the importance of this visit.  Patient has again expressed her desire for repeat cesarean delivery.  This needs to be scheduled at her next visit.  Growth scans every 4 weeks. - MFM

## 2023-07-06 ENCOUNTER — Other Ambulatory Visit: Payer: Self-pay

## 2023-07-06 DIAGNOSIS — O99810 Abnormal glucose complicating pregnancy: Secondary | ICD-10-CM

## 2023-07-06 LAB — 28 WEEK RH+PANEL
Basophils Absolute: 0 10*3/uL (ref 0.0–0.2)
Basos: 0 %
EOS (ABSOLUTE): 0.1 10*3/uL (ref 0.0–0.4)
Eos: 1 %
Gestational Diabetes Screen: 146 mg/dL — ABNORMAL HIGH (ref 70–139)
HIV Screen 4th Generation wRfx: NONREACTIVE
Hematocrit: 31.4 % — ABNORMAL LOW (ref 34.0–46.6)
Hemoglobin: 9.2 g/dL — ABNORMAL LOW (ref 11.1–15.9)
Immature Grans (Abs): 0.1 10*3/uL (ref 0.0–0.1)
Immature Granulocytes: 1 %
Lymphocytes Absolute: 1.5 10*3/uL (ref 0.7–3.1)
Lymphs: 16 %
MCH: 23.1 pg — ABNORMAL LOW (ref 26.6–33.0)
MCHC: 29.3 g/dL — ABNORMAL LOW (ref 31.5–35.7)
MCV: 79 fL (ref 79–97)
Monocytes Absolute: 0.5 10*3/uL (ref 0.1–0.9)
Monocytes: 5 %
Neutrophils Absolute: 7.4 10*3/uL — ABNORMAL HIGH (ref 1.4–7.0)
Neutrophils: 77 %
Platelets: 226 10*3/uL (ref 150–450)
RBC: 3.99 x10E6/uL (ref 3.77–5.28)
RDW: 14.6 % (ref 11.7–15.4)
RPR Ser Ql: NONREACTIVE
WBC: 9.6 10*3/uL (ref 3.4–10.8)

## 2023-07-13 ENCOUNTER — Other Ambulatory Visit: Payer: Medicaid Other

## 2023-07-14 ENCOUNTER — Encounter
Admission: RE | Admit: 2023-07-14 | Discharge: 2023-07-14 | Disposition: A | Discharge status: Home / Self Care | Payer: Medicaid Other | Source: Ambulatory Visit | Attending: Gastroenterology | Admitting: Gastroenterology

## 2023-07-14 NOTE — Progress Notes (Signed)
[facility] Anesthesia Consultation  Rebecca Walton WUJ:[id number] DOB: [birth year] DOA: 07/14/2023 PCP: Glenetta Borg, CNM   Requesting physician: Logan Bores Date of consultation: 07/14/23 Reason for consultation: Obesity during pregnancy  CHIEF COMPLAINT:  Obesity during pregnancy  HISTORY OF PRESENT ILLNESS: Rebecca Walton  is a 32 y.o. female with a known history of class 3 obesity and previous cesarean section with an epidural.   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   PONV (postoperative nausea and vomiting)    Postpartum anemia 07/03/2017   Vaginal Pap smear, abnormal    cryo 2021    PAST SURGICAL HISTORY:  Past Surgical History:  Procedure Laterality Date   CESAREAN SECTION N/A 06/29/2017   Procedure: CESAREAN SECTION;  Surgeon: Conard Novak, MD;  Location: [facility] ORS;  Service: Obstetrics;  Laterality: N/A;   CHOLECYSTECTOMY N/A 08/22/2017   Procedure: LAPAROSCOPIC CHOLECYSTECTOMY;  Surgeon: Ancil Linsey, MD;  Location: [facility] ORS;  Service: General;  Laterality: N/A;    SOCIAL HISTORY:  Social History   Tobacco Use   Smoking status: Never   Smokeless tobacco: Never  Substance Use Topics   Alcohol use: Not Currently    Comment: last use 01/10/23  "smirnoff" drinks x 3    FAMILY HISTORY:  Family History  Problem Relation Age of Onset   Anemia Mother    Healthy Father    Asthma Sister    Anemia Sister    Healthy Sister    Healthy Brother    Healthy Maternal Grandmother    Diabetes Maternal Grandfather    Diabetes Paternal Grandmother     Healthy Paternal Grandfather    Epilepsy Maternal Aunt    Cerebral palsy Maternal Aunt     DRUG ALLERGIES:  Allergies  Allergen Reactions   Coconut Flavoring Agent (Non-Screening) Swelling    Throat swells and closes.    REVIEW OF SYSTEMS:   RESPIRATORY: No cough, shortness of breath, wheezing.  CARDIOVASCULAR: No chest pain, orthopnea, edema.  HEMATOLOGY: No anemia, easy bruising or bleeding SKIN: No rash or lesion. NEUROLOGIC: No tingling, numbness, weakness.  PSYCHIATRY: No anxiety or depression.   MEDICATIONS AT HOME:  Prior to Admission medications   Medication Sig Start Date End Date Taking? Authorizing Provider  aspirin EC 81 MG tablet Take 2 tablets (162 mg total) by mouth daily. Swallow whole. Start at 13 weeks 03/14/23   Glenetta Borg, CNM  ferrous sulfate 324 MG TBEC Take 324 mg by mouth daily. Pt unsure of mg takes 1 tablet daily Patient not taking: Reported on 07/05/2023    [provider]  Prenatal MV & Min w/FA-DHA (PRENATAL GUMMIES PO) Take by mouth.    [provider]      PHYSICAL EXAMINATION:   VITAL SIGNS: Weight 112.2 kg, last menstrual period 12/16/2022.  GENERAL:  32 y.o.-year-old patient no acute distress.  HEENT: Head atraumatic, normocephalic. Oropharynx and nasopharynx clear. MP 4, TM distance >3 cm, normal mouth opening. LUNGS: No use of accessory muscles of respiration.   EXTREMITIES: No pedal edema, cyanosis, or clubbing.  NEUROLOGIC: normal  gait PSYCHIATRIC: The patient is alert and oriented x 3.  SKIN: No obvious rash, lesion, or ulcer.    IMPRESSION AND PLAN:   Rebecca Walton  is a 32 y.o. female presenting with obesity during pregnancy. BMI is currently 47.7 at [redacted] weeks gestation.   We discussed analgesic options during labor including epidural analgesia. Discussed that in obesity there can be increased difficulty with epidural placement or even failure of successful epidural. We also discussed that even after  successful epidural placement there is increased risk of catheter migration out of the epidural space that would require catheter replacement. Discussed use of epidural vs spinal vs GA if cesarean delivery is required. Discussed increased risk of difficult intubation during pregnancy should an emergency cesarean delivery be required.

## 2023-07-20 ENCOUNTER — Ambulatory Visit (INDEPENDENT_AMBULATORY_CARE_PROVIDER_SITE_OTHER): Payer: Medicaid Other | Admitting: Obstetrics and Gynecology

## 2023-07-20 ENCOUNTER — Other Ambulatory Visit: Payer: Medicaid Other

## 2023-07-20 ENCOUNTER — Encounter: Payer: Self-pay | Admitting: Obstetrics and Gynecology

## 2023-07-20 VITALS — BP 130/86 | HR 103 | Wt 246.9 lb

## 2023-07-20 DIAGNOSIS — Z98891 History of uterine scar from previous surgery: Secondary | ICD-10-CM

## 2023-07-20 DIAGNOSIS — Z3A30 30 weeks gestation of pregnancy: Secondary | ICD-10-CM

## 2023-07-20 DIAGNOSIS — O0992 Supervision of high risk pregnancy, unspecified, second trimester: Secondary | ICD-10-CM

## 2023-07-20 DIAGNOSIS — O99210 Obesity complicating pregnancy, unspecified trimester: Secondary | ICD-10-CM

## 2023-07-20 DIAGNOSIS — O99810 Abnormal glucose complicating pregnancy: Secondary | ICD-10-CM

## 2023-07-20 NOTE — Progress Notes (Signed)
 Rebecca Walton. Patient states daily fetal movement. Reports she has not been taking her iron  supplements but will try the gummy options. Growth ultrasound scheduled for 08/04/23. OB Anes consult completed. 3 hour GTT today. She states no questions or concerns at this time.

## 2023-07-20 NOTE — Progress Notes (Signed)
 ROB: She has no complaints today.  She is doing her 3-hour GTT today.  She has seen anesthesia for her consult due to increased BMI.  She is scheduled for NSTs beginning at 32 weeks and follow-up growth ultrasounds.  She is really expressed her desire for repeat cesarean delivery.  I have scheduled this for March 3.

## 2023-07-21 LAB — GESTATIONAL GLUCOSE TOLERANCE
Glucose, Fasting: 89 mg/dL (ref 70–94)
Glucose, GTT - 1 Hour: 160 mg/dL (ref 70–179)
Glucose, GTT - 2 Hour: 159 mg/dL — ABNORMAL HIGH (ref 70–154)
Glucose, GTT - 3 Hour: 122 mg/dL (ref 70–139)

## 2023-08-03 ENCOUNTER — Telehealth: Payer: Self-pay | Admitting: Licensed Practical Nurse

## 2023-08-03 ENCOUNTER — Encounter: Payer: Medicaid Other | Admitting: Licensed Practical Nurse

## 2023-08-03 NOTE — Telephone Encounter (Signed)
Reached out to pt to reschedule ROB appt that was scheduled on 08/02/2023 at 11:15 with LMD.  Left message for pt to call back.

## 2023-08-04 ENCOUNTER — Ambulatory Visit: Payer: Medicaid Other | Attending: Maternal & Fetal Medicine

## 2023-08-04 NOTE — Telephone Encounter (Signed)
Pt has rescheduled for 08/08/2023 at 3:15 with S. Free.

## 2023-08-08 ENCOUNTER — Ambulatory Visit (INDEPENDENT_AMBULATORY_CARE_PROVIDER_SITE_OTHER): Payer: Medicaid Other

## 2023-08-08 VITALS — BP 118/77 | HR 98 | Wt 248.0 lb

## 2023-08-08 DIAGNOSIS — O99213 Obesity complicating pregnancy, third trimester: Secondary | ICD-10-CM

## 2023-08-08 DIAGNOSIS — O99210 Obesity complicating pregnancy, unspecified trimester: Secondary | ICD-10-CM

## 2023-08-08 DIAGNOSIS — E669 Obesity, unspecified: Secondary | ICD-10-CM

## 2023-08-08 DIAGNOSIS — O99019 Anemia complicating pregnancy, unspecified trimester: Secondary | ICD-10-CM | POA: Insufficient documentation

## 2023-08-08 DIAGNOSIS — Z3480 Encounter for supervision of other normal pregnancy, unspecified trimester: Secondary | ICD-10-CM

## 2023-08-08 DIAGNOSIS — D649 Anemia, unspecified: Secondary | ICD-10-CM

## 2023-08-08 DIAGNOSIS — Z3A33 33 weeks gestation of pregnancy: Secondary | ICD-10-CM

## 2023-08-08 DIAGNOSIS — O99013 Anemia complicating pregnancy, third trimester: Secondary | ICD-10-CM

## 2023-08-08 NOTE — Progress Notes (Signed)
    Return Prenatal Note   Assessment/Plan   Plan  33 y.o. G3P1011 at [redacted]w[redacted]d presents for follow-up OB visit. Reviewed prenatal record including previous visit note.  Obesity affecting pregnancy - Weekly testing with MFM starting on 1/24. Last growth [place] on 12/17, EFW 66%, AFI 19.8.   Supervision of other normal pregnancy, antepartum - Has already completed CS planning with Dr. Logan Bores and has surgery scheduled for 3/3.  - Reviewed kick counts and preterm labor warning signs. Instructed to call office or come to hospital with persistent headache, vision changes, regular contractions, leaking of fluid, decreased fetal movement or vaginal bleeding.  Anemia affecting pregnancy - Had been taking Fe pill for a couple weeks but it was making her sick so she stopped taking it and has only just recently started taking Fe gummies. CBC today. Discussed possible Fe infusion if hemoglobin levels remain significantly low.    No orders of the defined types were placed in this encounter.  Return in about 2 weeks (around 08/22/2023) for ROB.   Future Appointments  Date Time Provider Department Center  08/11/2023 10:45 AM [facility] NST [facility]  08/18/2023 10:45 AM [facility] NST [facility]  08/25/2023 10:45 AM [facility] NST [facility]  09/13/2023  8:00 AM [facility] PAT1 [facility] None  09/15/2023 10:30 AM [facility] US2 [facility]    For next visit:  continue with routine prenatal care     Subjective   32 y.o. G3P1011 at [redacted]w[redacted]d presents for this follow-up prenatal visit.  Patient has no questions today. Patient reports: Movement: Present Contractions: Irritability  Objective   Flow sheet Vitals: Pulse Rate: 98 BP: 118/77 Fundal Height: 35 cm Fetal Heart Rate (bpm): 140 Total weight gain: 18 lb (8.165 kg)  General Appearance  No acute distress, well appearing, and well nourished Pulmonary   Normal work of breathing Neurologic   Alert and oriented to person, place, and  time Psychiatric   Mood and affect within normal limits  Lindalou Hose Lockie Bothun, CNM  01/21/253:33 PM

## 2023-08-08 NOTE — Addendum Note (Signed)
Addended by: Loney Laurence on: 08/08/2023 03:41 PM   Modules accepted: Orders

## 2023-08-08 NOTE — Assessment & Plan Note (Signed)
-   Had been taking Fe pill for a couple weeks but it was making her sick so she stopped taking it and has only just recently started taking Fe gummies. CBC today. Discussed possible Fe infusion if hemoglobin levels remain significantly low.

## 2023-08-08 NOTE — Assessment & Plan Note (Signed)
-   Has already completed CS planning with Dr. Logan Bores and has surgery scheduled for 3/3.  - Reviewed kick counts and preterm labor warning signs. Instructed to call office or come to hospital with persistent headache, vision changes, regular contractions, leaking of fluid, decreased fetal movement or vaginal bleeding.

## 2023-08-08 NOTE — Assessment & Plan Note (Signed)
-   Weekly testing with MFM starting on 1/24. Last growth [place] on 12/17, EFW 66%, AFI 19.8.

## 2023-08-09 LAB — CBC
Hematocrit: 30 % — ABNORMAL LOW (ref 34.0–46.6)
Hemoglobin: 8.8 g/dL — ABNORMAL LOW (ref 11.1–15.9)
MCH: 21.8 pg — ABNORMAL LOW (ref 26.6–33.0)
MCHC: 29.3 g/dL — ABNORMAL LOW (ref 31.5–35.7)
MCV: 74 fL — ABNORMAL LOW (ref 79–97)
Platelets: 238 10*3/uL (ref 150–450)
RBC: 4.03 x10E6/uL (ref 3.77–5.28)
RDW: 15.5 % — ABNORMAL HIGH (ref 11.7–15.4)
WBC: 9.3 10*3/uL (ref 3.4–10.8)

## 2023-08-11 ENCOUNTER — Other Ambulatory Visit: Payer: Self-pay

## 2023-08-11 ENCOUNTER — Ambulatory Visit: Payer: Medicaid Other | Attending: Maternal & Fetal Medicine

## 2023-08-11 ENCOUNTER — Telehealth: Payer: Self-pay

## 2023-08-11 DIAGNOSIS — O99013 Anemia complicating pregnancy, third trimester: Secondary | ICD-10-CM

## 2023-08-11 NOTE — Telephone Encounter (Signed)
Verified patient name and DOB. Spoke with Sherree about recent labwork showing decreasing hemoglobin now at 8.8. Recommended Fe infusion which she is in agreement with. Orders placed and patient given phone number to schedule with [facility] Day Surgery.

## 2023-08-18 ENCOUNTER — Other Ambulatory Visit: Payer: Medicaid Other

## 2023-08-22 ENCOUNTER — Encounter: Payer: Medicaid Other | Admitting: Obstetrics

## 2023-08-22 ENCOUNTER — Telehealth: Payer: Self-pay | Admitting: Obstetrics

## 2023-08-22 NOTE — Patient Instructions (Incomplete)

## 2023-08-22 NOTE — Progress Notes (Deleted)
    Return Prenatal Note   Subjective  33 y.o. G3P1011 at [redacted]w[redacted]d presents for this follow-up prenatal visit.  Patient ***  Patient reports:   Denies vaginal bleeding or leaking fluid. Objective  Flow sheet Vitals:   Total weight gain: 18 lb (8.165 kg)  General Appearance  No acute distress, well appearing, and well nourished Pulmonary   Normal work of breathing Neurologic   Alert and oriented to person, place, and time Psychiatric   Mood and affect within normal limits  Assessment/Plan   Plan  32 y.o. G3P1011 at [redacted]w[redacted]d presents for follow-up OB visit. Reviewed prenatal record including previous visit note. There are no diagnoses linked to this encounter.  No problem-specific Assessment & Plan notes found for this encounter.    No orders of the defined types were placed in this encounter.  No follow-ups on file.   Future Appointments  Date Time Provider Department Center  08/22/2023  3:35 PM Leigh Sober, MD [facility] None  08/23/2023 10:00 AM [facility] PAT 1 [facility] None  08/25/2023 10:45 AM [facility] NST [facility]  08/30/2023 10:00 AM [facility] PAT 1 [facility] None  09/13/2023  8:00 AM [facility] PAT1 [facility] None  09/15/2023 10:30 AM [facility] US2 [facility]    For next visit:  {SJFprenatalcare:[id number]}      Sober Leigh, DO [facility]

## 2023-08-22 NOTE — Telephone Encounter (Signed)
Reached out to pt to reschedule Rob appt that was scheduled on 08/22/2023 at 3:35 with Dr. Lonny Prude.  Left message for pt to call back.

## 2023-08-23 ENCOUNTER — Ambulatory Visit
Admission: RE | Admit: 2023-08-23 | Discharge: 2023-08-23 | Disposition: A | Discharge status: Home / Self Care | Payer: Medicaid Other | Source: Ambulatory Visit

## 2023-08-23 DIAGNOSIS — Z3A00 Weeks of gestation of pregnancy not specified: Secondary | ICD-10-CM | POA: Insufficient documentation

## 2023-08-23 DIAGNOSIS — O99013 Anemia complicating pregnancy, third trimester: Secondary | ICD-10-CM | POA: Diagnosis present

## 2023-08-23 LAB — CBC
HCT: 30.7 % — ABNORMAL LOW (ref 36.0–46.0)
Hemoglobin: 9.1 g/dL — ABNORMAL LOW (ref 12.0–15.0)
MCH: 21.7 pg — ABNORMAL LOW (ref 26.0–34.0)
MCHC: 29.6 g/dL — ABNORMAL LOW (ref 30.0–36.0)
MCV: 73.1 fL — ABNORMAL LOW (ref 80.0–100.0)
Platelets: 247 10*3/uL (ref 150–400)
RBC: 4.2 MIL/uL (ref 3.87–5.11)
RDW: 16.2 % — ABNORMAL HIGH (ref 11.5–15.5)
WBC: 11.5 10*3/uL — ABNORMAL HIGH (ref 4.0–10.5)
nRBC: 0.2 % (ref 0.0–0.2)

## 2023-08-23 MED ORDER — IRON SUCROSE 500 MG IVPB - SIMPLE MED
500.0000 mg | INTRAVENOUS | Status: DC
  Administered 2023-08-23: 500 mg via INTRAVENOUS
  Filled 2023-08-23: qty 500

## 2023-08-23 NOTE — Telephone Encounter (Signed)
 Reached out to pt (2x) to reschedule ROB appt that was scheduled on 08/22/2023 at 3:35 with Dr. Dell Fennel.  Was able to reschedule for 08/29/23 at 3:35 with Dr. Dell Fennel.

## 2023-08-25 ENCOUNTER — Other Ambulatory Visit: Payer: Medicaid Other

## 2023-08-25 ENCOUNTER — Other Ambulatory Visit: Payer: Self-pay | Admitting: *Deleted

## 2023-08-25 ENCOUNTER — Other Ambulatory Visit: Payer: Self-pay | Admitting: Obstetrics and Gynecology

## 2023-08-25 ENCOUNTER — Ambulatory Visit: Payer: Medicaid Other | Attending: Obstetrics and Gynecology

## 2023-08-25 DIAGNOSIS — O24410 Gestational diabetes mellitus in pregnancy, diet controlled: Secondary | ICD-10-CM

## 2023-08-25 DIAGNOSIS — O99213 Obesity complicating pregnancy, third trimester: Secondary | ICD-10-CM

## 2023-08-28 ENCOUNTER — Ambulatory Visit: Payer: Medicaid Other | Admitting: Family Medicine

## 2023-08-28 DIAGNOSIS — Z202 Contact with and (suspected) exposure to infections with a predominantly sexual mode of transmission: Secondary | ICD-10-CM | POA: Insufficient documentation

## 2023-08-28 DIAGNOSIS — Z113 Encounter for screening for infections with a predominantly sexual mode of transmission: Secondary | ICD-10-CM

## 2023-08-28 MED ORDER — PENICILLIN G BENZATHINE 1200000 UNIT/2ML IM SUSY
2.4000 10*6.[IU] | PREFILLED_SYRINGE | Freq: Once | INTRAMUSCULAR | Status: AC
  Administered 2023-08-28: 2.4 10*6.[IU] via INTRAMUSCULAR

## 2023-08-28 NOTE — Progress Notes (Signed)
 [facility] Department STI clinic [address], [address] [place] [address] Main phone: [phone]  STI screening visit  Subjective:  Rebecca Walton is a 33 y.o. female being seen today for an STI screening visit. The patient reports they do not have symptoms.  Patient reports that they do desire a pregnancy in the next year.   They reported they are not interested in discussing contraception today.    Patient's last menstrual period was 12/16/2022 (exact date).  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    HPI HPI Patient reports to clinic as a contact to syphilis. Denies symptoms. States that she has had cold like symptoms for a few weeks, and recently had a small "pus filled" sore in her mouth that was painful. Reports her OB said this was likely due to her being sick  Does the patient using douching products? No  Last HIV test per patient/review of record was  Lab Results  Component Value Date   HMHIVSCREEN Negative - Validated 03/24/2017    Lab Results  Component Value Date   HIV Non Reactive 07/05/2023     Last HEPC test per patient/review of record was No results found for: "HMHEPCSCREEN" No components found for: "HEPC"   Last HEPB test per patient/review of record was No components found for: "HMHEPBSCREEN"   Patient reports last pap was: 2022      Component Value Date/Time   DIAGPAP  11/30/2020 1102    - Negative for intraepithelial lesion or malignancy (NILM)   DIAGPAP  05/15/2020 1532    - Negative for Intraepithelial Lesions or Malignancy (NILM)   DIAGPAP - Benign reactive/reparative changes 05/15/2020 1532   ADEQPAP  11/30/2020 1102    Satisfactory for evaluation; transformation zone component PRESENT.   ADEQPAP  05/15/2020 1532    Satisfactory for evaluation; transformation zone component ABSENT.   Lab Results  Component Value Date   SPECADGYN Comment 05/22/2019   Result Date Procedure Results Follow-ups  11/30/2020 Cytology - PAP Adequacy: Satisfactory for evaluation; transformation zone component PRESENT. Diagnosis: - Negative for intraepithelial lesion or malignancy (NILM) Microorganisms: Shift in flora suggestive of bacterial vaginosis   05/15/2020 Cytology - PAP Adequacy: Satisfactory for evaluation; transformation zone component ABSENT. Diagnosis: - Negative for Intraepithelial Lesions or Malignancy (NILM) Diagnosis: - Benign reactive/reparative changes Microorganisms: Shift in flora suggestive of bacterial vaginosis   10/07/2019 Surgical pathology SURGICAL PATHOLOGY: SURGICAL PATHOLOGY CASE: [id number] PATIENT: Kathyrn Parkinson Surgical Pathology Report     Clinical History: LGSIL (nt)     FINAL MICROSCOPIC DIAGNOSIS:  A. ENDOCERVIX, CURETTAGE: - High grade squamous intraepithelial lesion, CIN-II (mo...   05/22/2019 IGP, rfx Aptima HPV ASCU DIAGNOSIS:: Comment (A) PAP Reflex: Comment Specimen adequacy:: Comment Clinician Provided ICD10: Comment Performed by:: Comment Electronically signed by:: Comment PAP Smear Comment: . PATHOLOGIST PROVIDED ICD10:: Comment Note:: Comment Test Methodology: Comment   05/15/2018 Surgical pathology    03/27/2018 HM PAP SMEAR HM  Pap smear: LSIL, HPV positive     Screening for MPX risk: Does the patient have an unexplained rash? No Is the patient MSM? No Does  the patient endorse multiple sex partners or anonymous sex partners? No Did the patient have close or sexual contact with a person diagnosed with MPX? No Has the patient traveled outside the [place]  where MPX is endemic? No Is there a high clinical suspicion for MPX-- evidenced by one of the following No  -Unlikely to be chickenpox  -Lymphadenopathy  -Rash that present in same phase of evolution on any given body part See flowsheet for further details and programmatic requirements.   Immunization history:  Immunization History  Administered Date(s) Administered   Moderna Sars-Covid-2 Vaccination 04/07/2020     The following portions of the patient's history were reviewed and updated as appropriate: allergies, current medications, past medical history, past social history, past surgical history and problem list.  Objective:  There were no vitals filed for this visit.  Physical Exam Vitals and nursing note reviewed.  Constitutional:      Appearance: Normal appearance.  HENT:     Head: Normocephalic and atraumatic.     Mouth/Throat:     Mouth: Mucous membranes are moist.     Dentition: Dental caries present.     Pharynx: Oropharynx is clear. No oropharyngeal exudate or posterior oropharyngeal erythema.     Comments: No lesions Pulmonary:     Effort: Pulmonary effort is normal.  Abdominal:     General: Abdomen is flat.     Palpations: There is no mass.     Tenderness: There is no abdominal tenderness. There is no rebound.  Genitourinary:    Comments: Declined vaginal exam- no symptoms Lymphadenopathy:     Head:     Right side of head: No preauricular or posterior auricular adenopathy.     Left side of head: No preauricular or posterior auricular adenopathy.     Cervical: No cervical adenopathy.     Upper Body:     Right upper body: No supraclavicular, axillary or epitrochlear adenopathy.     Left upper body: No supraclavicular, axillary or epitrochlear adenopathy.  Skin:     General: Skin is warm and dry.     Findings: No rash.  Neurological:     Mental Status: She is alert and oriented to person, place, and time.     Assessment and Plan:  Rebecca Walton is a 33 y.o. female presenting to the [facility] Department for STI screening  1. Exposure to syphilis (Primary)  - penicillin  g benzathine (BICILLIN  LA) 1200000 UNIT/2ML injection 2.4 Million Units - Syphilis Serology, [facility]  2. Screening for venereal disease  - Syphilis Serology, [facility]   Patient accepted all screenings including RPR . Patient meets criteria for HepB screening? No. Ordered? not applicable Patient meets criteria for HepC screening? No. Ordered? not applicable  Treat wet prep per standing order Discussed time line for [facility] Lab results and that patient will be called with positive results and encouraged patient to call if she had not heard in 2 weeks.  Counseled to return or seek care for continued or worsening symptoms Recommended repeat testing in 3 months with positive results. Recommended condom use with all sex for STI prevention.   Patient is currently using  nothing  to prevent pregnancy.  Patient is pregnant  No follow-ups on file.  Future Appointments  Date Time Provider Department Center  08/29/2023  3:35 PM  Sofia Dunn, MD [facility] None  08/30/2023 10:00 AM [facility] PAT 1 [facility] None  09/13/2023  8:00 AM [facility] PAT1 [facility] None  09/15/2023 10:30 AM [facility] US2 [facility]    Earleen Glazier, FNP

## 2023-08-28 NOTE — Progress Notes (Signed)
 Pt here for STI screen as contact to syphilis.  Labs drawn.  Bicillin  1.2 mil x 2 given in RUOQ and LUOQ by Heath Litten, RN and K. Oppong, RN without complications. Pt to call with any further questions or concerns.-Leandre Wien, RN

## 2023-08-29 ENCOUNTER — Other Ambulatory Visit (HOSPITAL_COMMUNITY)
Admission: RE | Admit: 2023-08-29 | Discharge: 2023-08-29 | Disposition: A | Discharge status: Home / Self Care | Payer: Medicaid Other | Source: Ambulatory Visit | Attending: Obstetrics | Admitting: Obstetrics

## 2023-08-29 ENCOUNTER — Ambulatory Visit (INDEPENDENT_AMBULATORY_CARE_PROVIDER_SITE_OTHER): Payer: Medicaid Other | Admitting: Obstetrics

## 2023-08-29 ENCOUNTER — Encounter: Payer: Self-pay | Admitting: Obstetrics

## 2023-08-29 VITALS — BP 133/74 | HR 86 | Wt 251.0 lb

## 2023-08-29 DIAGNOSIS — Z113 Encounter for screening for infections with a predominantly sexual mode of transmission: Secondary | ICD-10-CM | POA: Diagnosis present

## 2023-08-29 DIAGNOSIS — Z3685 Encounter for antenatal screening for Streptococcus B: Secondary | ICD-10-CM

## 2023-08-29 DIAGNOSIS — F79 Unspecified intellectual disabilities: Secondary | ICD-10-CM

## 2023-08-29 DIAGNOSIS — D649 Anemia, unspecified: Secondary | ICD-10-CM | POA: Diagnosis not present

## 2023-08-29 DIAGNOSIS — O99343 Other mental disorders complicating pregnancy, third trimester: Secondary | ICD-10-CM

## 2023-08-29 DIAGNOSIS — O99513 Diseases of the respiratory system complicating pregnancy, third trimester: Secondary | ICD-10-CM

## 2023-08-29 DIAGNOSIS — O99213 Obesity complicating pregnancy, third trimester: Secondary | ICD-10-CM | POA: Diagnosis not present

## 2023-08-29 DIAGNOSIS — O34219 Maternal care for unspecified type scar from previous cesarean delivery: Secondary | ICD-10-CM | POA: Diagnosis not present

## 2023-08-29 DIAGNOSIS — Z202 Contact with and (suspected) exposure to infections with a predominantly sexual mode of transmission: Secondary | ICD-10-CM

## 2023-08-29 DIAGNOSIS — E669 Obesity, unspecified: Secondary | ICD-10-CM | POA: Diagnosis not present

## 2023-08-29 DIAGNOSIS — O0990 Supervision of high risk pregnancy, unspecified, unspecified trimester: Secondary | ICD-10-CM

## 2023-08-29 DIAGNOSIS — O99013 Anemia complicating pregnancy, third trimester: Secondary | ICD-10-CM

## 2023-08-29 DIAGNOSIS — J45909 Unspecified asthma, uncomplicated: Secondary | ICD-10-CM

## 2023-08-29 DIAGNOSIS — Z98891 History of uterine scar from previous surgery: Secondary | ICD-10-CM

## 2023-08-29 DIAGNOSIS — Z3A36 36 weeks gestation of pregnancy: Secondary | ICD-10-CM | POA: Diagnosis not present

## 2023-08-29 NOTE — Patient Instructions (Signed)
Signs and Symptoms of Labor Labor is the body's natural process of moving the baby and the placenta out of the uterus. The process of labor usually starts when the baby is full-term, between 74 and 41 weeks of pregnancy. Signs and symptoms that you are close to going into labor As your body prepares for labor and the birth of your baby, you may notice the following symptoms in the weeks and days before true labor starts: Passing a small amount of thick, bloody mucus from your vagina. This is called normal bloody show or losing your mucus plug. This may happen more than a week before labor begins, or right before labor begins, as the opening of the cervix starts to widen (dilate). For some women, the entire mucus plug passes at once. For others, pieces of the mucus plug may gradually pass over several days. Your baby moving (dropping) lower in your pelvis to get into position for birth (lightening). When this happens, you may feel more pressure on your bladder and pelvic bone and less pressure on your ribs. This may make it easier to breathe. It may also cause you to need to urinate more often and have problems with bowel movements. Having "practice contractions," also called Braxton Hicks contractions or false labor. These occur at irregular (unevenly spaced) intervals that are more than 10 minutes apart. False labor contractions are common after exercise or sexual activity. They will stop if you change position, rest, or drink fluids. These contractions are usually mild and do not get stronger over time. They may feel like: A backache or back pain. Mild cramps, similar to menstrual cramps. Tightening or pressure in your abdomen. Other early symptoms include: Nausea or loss of appetite. Diarrhea. Having a sudden burst of energy, or feeling very tired. Mood changes. Having trouble sleeping. Signs and symptoms that labor has begun Signs that you are in labor may include: Having contractions that come  at regular (evenly spaced) intervals and increase in intensity. This may feel like more intense tightening or pressure in your abdomen that moves to your back. Contractions may also feel like rhythmic pain in your upper thighs or back that comes and goes at regular intervals. If you are delivering for the first time, this change in intensity of contractions often occurs at a more gradual pace. If you have given birth before, you may notice a more rapid progression of contraction changes. Feeling pressure in the vaginal area. Your water breaking (rupture of membranes). This is when the sac of fluid that surrounds your baby breaks. Fluid leaking from your vagina may be clear or blood-tinged. Labor usually starts within 24 hours of your water breaking, but it may take longer to begin. Some people may feel a sudden gush of fluid; others may notice repeatedly damp underwear. Follow these instructions at home:  When labor starts, or if your water breaks, call your health care provider or nurse care line. Based on your situation, they will determine when you should go in for an exam. During early labor, you may be able to rest and manage symptoms at home. Some strategies to try at home include: Breathing and relaxation techniques. Taking a warm bath or shower. Listening to music. Using a heating pad on the lower back for pain. If directed, apply heat to the area as often as told by your health care provider. Use the heat source that your health care provider recommends, such as a moist heat pack or a heating pad. Place a  towel between your skin and the heat source. Leave the heat on for 20-30 minutes. Remove the heat if your skin turns bright red. This is especially important if you are unable to feel pain, heat, or cold. You have a greater risk of getting burned. Contact a health care provider if: Your labor has started. Your water breaks. You have nausea, vomiting, or diarrhea. Get help right away  if: You have painful, regular contractions that are 5 minutes apart or less. Labor starts before you are [redacted] weeks along in your pregnancy. You have a fever. You have bright red blood coming from your vagina. You do not feel your baby moving. You have a severe headache with or without vision problems. You have chest pain or shortness of breath. These symptoms may represent a serious problem that is an emergency. Do not wait to see if the symptoms will go away. Get medical help right away. Call your local emergency services (911 in the [place] Do not drive yourself to the hospital. Summary Labor is your body's natural process of moving your baby and the placenta out of your uterus. The process of labor usually starts when your baby is full-term, between 25 and 40 weeks of pregnancy. When labor starts, or if your water breaks, call your health care provider or nurse care line. Based on your situation, they will determine when you should go in for an exam. This information is not intended to replace advice given to you by your health care provider. Make sure you discuss any questions you have with your health care provider. Document Revised: 11/17/2020 Document Reviewed: 11/17/2020 Elsevier Patient Education  2024 [employer].

## 2023-08-29 NOTE — Progress Notes (Signed)
    Return Prenatal Note   Subjective  33 y.o. G3P1011 at [redacted]w[redacted]d presents for this follow-up prenatal visit. Pregnancy notable for prior CS x 1, BMI, anemia, asthma, mild intellectual disability,   Patient arrived 30 minutes late to her appointment. Was seen at [facility] yesterday and treated with Bicillin as a syphilis contact. Her husband is positive, possibly through a plasma donation site, and she denies intercourse with him since prior to his positive test result.   Patient reports: Movement: Present Contractions: Not present Denies vaginal bleeding or leaking fluid. Objective  Flow sheet Vitals: Pulse Rate: 86 BP: 133/74 Total weight gain: 21 lb (9.526 kg)  General Appearance  No acute distress, well appearing, and well nourished Pulmonary   Normal work of breathing Neurologic   Alert and oriented to person, place, and time Psychiatric   Mood and affect within normal limits  Tyesha Joffe 1990/12/20 33y.o. G3P1011 at [redacted]w[redacted]d presents for this follow-up prenatal visit. Pregnancy notable for prior CS x 1, BMI, anemia, asthma, mild intellectual disability,   Patient arrived 30 minutes late to her appointment. Was seen at [facility] yesterday and treated with Bicillin as a syphilis contact. Her husband is positive, possibly through a plasma donation site, and she denies intercourse with him since prior to his positive test result.   Patient reports: Movement: Present Contractions: Not present Denies vaginal bleeding or leaking fluid. Objective  Flow sheet Vitals: Pulse Rate: 86 BP: 133/74 Total weight gain: 21 lb (9.526 kg)  General Appearance  No acute distress, well appearing, and well nourished Pulmonary   Normal work of breathing Neurologic   Alert and oriented to person, place, and time Psychiatric   Mood and affect within normal limits  Tyesha Joffe 1990/12/20 33  Fetus A Non-Stress Test Interpretation for 08/29/23  Indication:  Obesity  Fetal Heart Rate A Mode: External Baseline Rate (A): 140 bpm Variability: Moderate Accelerations: 15 x 15 Decelerations: None Multiple birth?: No  Uterine Activity Mode: Toco  Interpretation (Fetal Testing) Nonstress Test Interpretation: Reactive Overall Impression: Reassuring for gestational age  Assessment/Plan   Plan  33 y.o. G3P1011 at [redacted]w[redacted]d LMP=9wk [place] presents for follow-up OB visit. Reviewed prenatal record including previous visit note.  1. Supervision of high risk pregnancy, antepartum (Primary) -GBS and GC/CT swabs done today  2. Obesity affecting pregnancy in third trimester, unspecified obesity type -BMI 49.02 today -S/p anesthesia consult 12/27 -Weekly NSTs, will schedule another growth [place] asap, last done 12/17, EFW 66%ile.   3. H/O cesarean section -Scheduled for repeat on 3/3 w/Dr. Logan Bores  4. Anemia affecting pregnancy in third trimester -Most recent Hgb 9.1 on  2/5, has appointment 2/12 for IV infusion  5. Exposure to syphilis -Husband contracted potentially through plasma donation site; pt has not had intercourse with him since prior to him becoming positive.  -Her 28wk RPR was negative -Seen ag [facility] on 2/10 and given empiric Bicillin dose #1 while awaiting serologies, still pending as of this apt   Orders Placed This Encounter  Procedures   Strep Gp B NAA   Return in about 1 week (around 09/05/2023) for ROB w/NST.   Future Appointments  Date Time Provider Department Center  08/30/2023 10:00 AM [facility] PAT 1 [facility] None  09/06/2023  8:15 AM [facility] RO[facility] None  09/06/2023  8:55 AM Linzie Collin, MD [facility] None  09/13/2023  8:00 AM [facility] PAT[facility] None  09/15/2023 10:30 AM [facility] US2 [facility]   For next visit:  continue with routine prenatal care   Julieanne Manson, DO [facility]

## 2023-08-30 ENCOUNTER — Ambulatory Visit
Admission: RE | Admit: 2023-08-30 | Discharge: 2023-08-30 | Disposition: A | Discharge status: Home / Self Care | Payer: Medicaid Other | Source: Ambulatory Visit

## 2023-08-30 DIAGNOSIS — D509 Iron deficiency anemia, unspecified: Secondary | ICD-10-CM | POA: Diagnosis present

## 2023-08-30 MED ORDER — IRON SUCROSE 500 MG IVPB - SIMPLE MED
500.0000 mg | Freq: Once | INTRAVENOUS | Status: AC
  Administered 2023-08-30: 500 mg via INTRAVENOUS
  Filled 2023-08-30: qty 500

## 2023-08-31 LAB — STREP GP B NAA: Strep Gp B NAA: POSITIVE — AB

## 2023-08-31 LAB — CERVICOVAGINAL ANCILLARY ONLY
Chlamydia: NEGATIVE
Comment: NEGATIVE
Comment: NORMAL
Neisseria Gonorrhea: NEGATIVE

## 2023-09-06 ENCOUNTER — Encounter: Payer: Self-pay | Admitting: Obstetrics and Gynecology

## 2023-09-06 ENCOUNTER — Ambulatory Visit (INDEPENDENT_AMBULATORY_CARE_PROVIDER_SITE_OTHER): Payer: Medicaid Other | Admitting: Obstetrics and Gynecology

## 2023-09-06 ENCOUNTER — Ambulatory Visit: Payer: Medicaid Other

## 2023-09-06 VITALS — BP 92/66 | HR 94 | Ht 60.0 in | Wt 252.1 lb

## 2023-09-06 VITALS — BP 92/66 | HR 94 | Wt 252.1 lb

## 2023-09-06 DIAGNOSIS — O99213 Obesity complicating pregnancy, third trimester: Secondary | ICD-10-CM

## 2023-09-06 DIAGNOSIS — E669 Obesity, unspecified: Secondary | ICD-10-CM

## 2023-09-06 DIAGNOSIS — Z3A37 37 weeks gestation of pregnancy: Secondary | ICD-10-CM

## 2023-09-06 DIAGNOSIS — Z98891 History of uterine scar from previous surgery: Secondary | ICD-10-CM | POA: Diagnosis not present

## 2023-09-06 DIAGNOSIS — O0990 Supervision of high risk pregnancy, unspecified, unspecified trimester: Secondary | ICD-10-CM | POA: Diagnosis not present

## 2023-09-06 LAB — POCT URINALYSIS DIPSTICK OB
Appearance: NORMAL
Bilirubin, UA: NEGATIVE
Blood, UA: NEGATIVE
Glucose, UA: NEGATIVE
Ketones, UA: NEGATIVE
Leukocytes, UA: NEGATIVE
Nitrite, UA: NEGATIVE
Odor: NORMAL
Spec Grav, UA: 1.02 (ref 1.010–1.025)
Urobilinogen, UA: 0.2 U/dL
pH, UA: 6 (ref 5.0–8.0)

## 2023-09-06 NOTE — Patient Instructions (Signed)

## 2023-09-06 NOTE — Progress Notes (Signed)
    NURSE VISIT NOTE  Subjective:    Patient ID: Rebecca Walton, female    DOB: 1990-11-30, 33 y.o.   MRN: [id number]  HPI  Patient is a 33 y.o. G53P1011 female who presents for fetal monitoring per order from Julieanne Manson, MD.   Objective:    BP 92/66   Pulse 94   Wt 252 lb 1.6 oz (114.4 kg)   LMP 12/16/2022 (Exact Date)   BMI 49.23 kg/m  Estimated Date of Delivery: 09/22/23  [redacted]w[redacted]d  Fetus A Non-Stress Test Interpretation for 09/06/23  Indication: Obesity  Fetal Heart Rate A Mode: External Baseline Rate (A): 130 bpm Variability: Moderate Accelerations: 15 x 15 Decelerations: None Multiple birth?: No  Uterine Activity Mode: Toco Contraction Frequency (min): Rare Contraction Duration (sec): 80 Contraction Quality: Mild  Interpretation (Fetal Testing) Nonstress Test Interpretation: Reactive Overall Impression: Reassuring for gestational age   Assessment:   1. Obesity affecting pregnancy in third trimester, unspecified obesity type   2. [redacted] weeks gestation of pregnancy      Plan:   Results reviewed and discussed with patient by  Brennan Bailey, MD.     Rocco Serene, LPN

## 2023-09-06 NOTE — Progress Notes (Signed)
ROB 363w5d: NST today. Patient reports good fetal movement, denies ctx, vb, lof. No concerns at this time.

## 2023-09-06 NOTE — Progress Notes (Signed)
ROB: 37.5 EGA today.  She has no complaints.  Reports daily fetal movement.  She stopped taking her aspirin 1 week ago.  We have discussed this and she plans to restart at this time.  Repeat cesarean delivery scheduled for March 3.  Schedule for BPP and growth scan next Friday with MFM.  Anesthesia consult previously completed.

## 2023-09-11 ENCOUNTER — Ambulatory Visit: Payer: Medicaid Other

## 2023-09-13 ENCOUNTER — Other Ambulatory Visit: Payer: Self-pay

## 2023-09-13 ENCOUNTER — Ambulatory Visit (INDEPENDENT_AMBULATORY_CARE_PROVIDER_SITE_OTHER): Payer: Medicaid Other | Admitting: Licensed Practical Nurse

## 2023-09-13 ENCOUNTER — Encounter
Admission: RE | Admit: 2023-09-13 | Discharge: 2023-09-13 | Disposition: A | Discharge status: Home / Self Care | Payer: Medicaid Other | Source: Ambulatory Visit | Attending: Obstetrics and Gynecology | Admitting: Obstetrics and Gynecology

## 2023-09-13 VITALS — BP 133/77 | HR 94 | Wt 256.3 lb

## 2023-09-13 DIAGNOSIS — O0990 Supervision of high risk pregnancy, unspecified, unspecified trimester: Secondary | ICD-10-CM | POA: Diagnosis not present

## 2023-09-13 DIAGNOSIS — Z98891 History of uterine scar from previous surgery: Secondary | ICD-10-CM | POA: Diagnosis not present

## 2023-09-13 DIAGNOSIS — Z3A38 38 weeks gestation of pregnancy: Secondary | ICD-10-CM | POA: Diagnosis not present

## 2023-09-13 HISTORY — DX: Unspecified asthma, uncomplicated: J45.909

## 2023-09-13 NOTE — Assessment & Plan Note (Signed)
-   Repeat scheduled for 09/18/2023

## 2023-09-13 NOTE — Assessment & Plan Note (Addendum)
-  First BP today 143/86. Repeat was 133/77. Will get BP at MFM appointment this Friday with plan to obtain labs if mild range.  -Asymptomatic.  -Trace protein on urine dip.  -Third trimester precautions discussed. Pre-eclampsia precautions discussed.

## 2023-09-13 NOTE — Patient Instructions (Addendum)
 Your procedure is scheduled on: Monday 09/18/23   Arrival Time: Please call Labor and Delivery the day before your scheduled C-Section to find out your arrival time. [phone].  Arrival: If your arrival time is prior to 6:00 am, please enter through the Emergency Room Entrance and you will be directed to Labor and Delivery. If your arrival time is 6:00 am or later, please enter the Medical Mall and follow the greeter's instructions.  REMEMBER: Instructions that are not followed completely may result in serious medical risk, up to and including death; or upon the discretion of your surgeon and anesthesiologist your surgery may need to be rescheduled.  Do not eat food after midnight the night before surgery.  No gum chewing or hard candies.   One week prior to surgery: Stop Anti-inflammatories (NSAIDS) such as Advil, Aleve, Ibuprofen, Motrin, Naproxen, Naprosyn and Aspirin based products such as Excedrin, Goody's Powder, BC Powder. Stop ANY OVER THE COUNTER supplements until after surgery.  You may however, continue to take Tylenol if needed for pain up until the day of surgery.   Continue taking all of your other prescription medications up until the day of surgery.  ON THE DAY OF SURGERY ONLY TAKE THESE MEDICATIONS WITH SIPS OF WATER:  None    No Alcohol for 24 hours before or after surgery.  No Smoking including e-cigarettes for 24 hours prior to surgery.  No chewable tobacco products for at least 6 hours prior to surgery.  No nicotine patches on the day of surgery.  Do not use any "recreational" drugs for at least a week prior to your surgery.  Please be advised that the combination of cocaine and anesthesia may have negative outcomes, up to and including death. If you test positive for cocaine, your surgery will be cancelled.  On the morning of surgery brush your teeth with toothpaste and water, you may rinse your mouth with mouthwash if you wish. Do not swallow any  toothpaste or mouthwash.  Use CHG wipes as directed on instruction sheet.  Do not wear jewelry, make-up, hairpins, clips or nail polish.  For welded (permanent) jewelry: bracelets, anklets, waist bands, etc.  Please have this removed prior to surgery.  If it is not removed, there is a chance that hospital personnel will need to cut it off on the day of surgery.  Do not wear lotions, powders, or perfumes.   Do not shave body hair from the neck down 48 hours before surgery.  Contact lenses, hearing aids and dentures may not be worn into surgery.  Do not bring valuables to the hospital. [facility] is not responsible for any missing/lost belongings or valuables.   Bring your C-PAP to the hospital with you in case you may have to spend the night.   Notify your doctor if there is any change in your medical condition (cold, fever, infection).  Wear comfortable clothing (specific to your surgery type) to the hospital.  After surgery, you can help prevent lung complications by doing breathing exercises.  Take deep breaths and cough every 1-2 hours. Your doctor may order a device called an Incentive Spirometer to help you take deep breaths. When coughing or sneezing, hold a pillow firmly against your incision with both hands. This is called "splinting." Doing this helps protect your incision. It also decreases belly discomfort.  Please call the Pre-admissions Testing Dept. at [phone] if you have any questions about these instructions.  Surgery Visitation Policy:  Visitor Passes   All visitors,  including children, need an identification sticker when visiting. These stickers must be worn where they can be seen.   Labor & Delivery  Laboring women may have one designated support person and two other visitors of any age visit. The support person must remain the same. The visitors may switch with other visitors. Visitation is permitted 24 hours per day. The designated support person  or a visitor over the age of 16 may sleep overnight in the patient's room. A doula registered with [facility] for labor and delivery support is not considered a visitor. Doulas not registered with  are considered visitors.  Mother Baby Unit, OB Specialty and Gynecological Care  A designated support person and three visitors of any age may visit. The three visitors may switch out. The designated support person or a visitor age 92 or older may stay overnight in the room. During the postpartum period (up to 6 weeks), if the mother is the patient, she can have her newborn stay with her if there is another support person present who can be responsible for the baby.   Temporary Visitor Restrictions Due to increasing cases of flu, RSV and COVID-19: Children ages 71 and under will not be able to visit patients in [facility] hospitals under most circumstances.  Preparing the Skin Before Surgery     To help prevent the risk of infection at your surgical site, we are now providing you with rinse-free Sage 2% Chlorhexidine Gluconate (CHG) disposable wipes.  Chlorhexidine Gluconate (CHG) Soap  o An antiseptic cleaner that kills germs and bonds with the skin to continue killing germs even after washing  o Used for showering the night before surgery and morning of surgery  The night before surgery: Shower or bathe with warm water. Do not apply perfume, lotions, powders. Wait one hour after shower. Skin should be dry and cool. Open Sage wipe package - use 6 disposable cloths. Wipe body using one cloth for the right arm, one cloth for the left arm, one cloth for the right leg, one cloth for the left leg, one cloth for the chest/abdomen area, and one cloth for the back. Do not use on open wounds or sores. Do not use on face or genitals (private parts). If you are breast feeding, do not use on breasts. 5. Do not rinse, allow to dry. 6. Skin may feel "tacky" for several minutes. 7.  Dress in clean clothes. 8. Place clean sheets on your bed and do not sleep with pets.  REPEAT ABOVE ON THE MORNING OF SURGERY BEFORE ARRIVING TO THE HOSPITAL.

## 2023-09-13 NOTE — Progress Notes (Signed)
    Return Prenatal Note   Subjective   33 y.o. G3P1011 at [redacted]w[redacted]d presents for this follow-up prenatal visit.  Rebecca Walton is feeling well overall. No concerns this visit. Feeling ready for baby! Movement: Present Contractions: Irritability  Objective   Flow sheet Vitals: Pulse Rate: 94 BP: 133/77 Fundal Height: 39 cm Fetal Heart Rate (bpm): 140 Total weight gain: 11.9 kg  General Appearance  No acute distress, well appearing, and well nourished Pulmonary   Normal work of breathing Neurologic   Alert and oriented to person, place, and time Psychiatric   Mood and affect within normal limits  Assessment/Plan   Plan  33 y.o. G3P1011 at [redacted]w[redacted]d presents for follow-up OB visit. Reviewed prenatal record including previous visit note.  H/O cesarean section - Repeat scheduled for 09/18/2023  Supervision of high risk pregnancy, antepartum -First BP today 143/86. Repeat was 133/77. Will get BP at MFM appointment this Friday with plan to obtain labs if mild range.  -Asymptomatic.  -Trace protein on urine dip.  -Third trimester precautions discussed. Pre-eclampsia precautions discussed.      Return for MFM 2/28, RLTCS 3/3 .   Future Appointments  Date Time Provider Department Center  09/15/2023  9:30 AM [facility] PAT4 [facility] None  09/15/2023 10:30 AM [facility] US2 [facility]  09/26/2023  9:35 AM Logan Bores Ellsworth Lennox, MD [facility] None    For next visit:   MFM appointment on 09/15/2023.      Eloise Levels, Student-MidWife  02/26/251:46 PM  Carie Caddy, CNM present for all portions of care.

## 2023-09-15 ENCOUNTER — Ambulatory Visit: Payer: Medicaid Other | Attending: Maternal & Fetal Medicine

## 2023-09-15 ENCOUNTER — Encounter
Admission: RE | Admit: 2023-09-15 | Discharge: 2023-09-15 | Disposition: A | Discharge status: Home / Self Care | Payer: Medicaid Other | Source: Ambulatory Visit | Attending: Obstetrics and Gynecology | Admitting: Obstetrics and Gynecology

## 2023-09-15 VITALS — BP 131/78

## 2023-09-15 DIAGNOSIS — E669 Obesity, unspecified: Secondary | ICD-10-CM

## 2023-09-15 DIAGNOSIS — O99513 Diseases of the respiratory system complicating pregnancy, third trimester: Secondary | ICD-10-CM | POA: Diagnosis not present

## 2023-09-15 DIAGNOSIS — Z01812 Encounter for preprocedural laboratory examination: Secondary | ICD-10-CM | POA: Diagnosis not present

## 2023-09-15 DIAGNOSIS — Z3A39 39 weeks gestation of pregnancy: Secondary | ICD-10-CM

## 2023-09-15 DIAGNOSIS — Z362 Encounter for other antenatal screening follow-up: Secondary | ICD-10-CM | POA: Diagnosis not present

## 2023-09-15 DIAGNOSIS — Z98891 History of uterine scar from previous surgery: Secondary | ICD-10-CM | POA: Insufficient documentation

## 2023-09-15 DIAGNOSIS — O99213 Obesity complicating pregnancy, third trimester: Secondary | ICD-10-CM | POA: Diagnosis present

## 2023-09-15 DIAGNOSIS — J45909 Unspecified asthma, uncomplicated: Secondary | ICD-10-CM

## 2023-09-15 DIAGNOSIS — O0990 Supervision of high risk pregnancy, unspecified, unspecified trimester: Secondary | ICD-10-CM | POA: Diagnosis present

## 2023-09-15 DIAGNOSIS — Z01818 Encounter for other preprocedural examination: Secondary | ICD-10-CM | POA: Diagnosis present

## 2023-09-15 LAB — CBC
HCT: 34 % — ABNORMAL LOW (ref 36.0–46.0)
Hemoglobin: 10.3 g/dL — ABNORMAL LOW (ref 12.0–15.0)
MCH: 24 pg — ABNORMAL LOW (ref 26.0–34.0)
MCHC: 30.3 g/dL (ref 30.0–36.0)
MCV: 79.1 fL — ABNORMAL LOW (ref 80.0–100.0)
Platelets: 199 10*3/uL (ref 150–400)
RBC: 4.3 MIL/uL (ref 3.87–5.11)
RDW: 23.1 % — ABNORMAL HIGH (ref 11.5–15.5)
WBC: 8.8 10*3/uL (ref 4.0–10.5)
nRBC: 0.2 % (ref 0.0–0.2)

## 2023-09-15 LAB — TYPE AND SCREEN
ABO/RH(D): A POS
Antibody Screen: NEGATIVE
Extend sample reason: UNDETERMINED

## 2023-09-16 LAB — RPR: RPR Ser Ql: NONREACTIVE

## 2023-09-17 MED ORDER — LACTATED RINGERS IV SOLN: INTRAVENOUS | Status: DC

## 2023-09-17 MED ORDER — CHLORHEXIDINE GLUCONATE 0.12 % MT SOLN
15.0000 mL | Freq: Once | OROMUCOSAL | Status: AC
  Administered 2023-09-18: 15 mL via OROMUCOSAL
  Filled 2023-09-17: qty 15

## 2023-09-17 MED ORDER — CEFAZOLIN SODIUM-DEXTROSE 2-4 GM/100ML-% IV SOLN
2.0000 g | INTRAVENOUS | Status: AC
  Administered 2023-09-18: 2 g via INTRAVENOUS
  Filled 2023-09-17: qty 100

## 2023-09-17 MED ORDER — ORAL CARE MOUTH RINSE: 15.0000 mL | Freq: Once | OROMUCOSAL | Status: AC

## 2023-09-17 MED ORDER — POVIDONE-IODINE 10 % EX SWAB
2.0000 | Freq: Once | CUTANEOUS | Status: AC
  Administered 2023-09-18: 2 via TOPICAL

## 2023-09-18 ENCOUNTER — Encounter: Payer: Self-pay | Admitting: Obstetrics and Gynecology

## 2023-09-18 ENCOUNTER — Encounter
Admission: RE | Disposition: A | Discharge status: Home / Self Care | Payer: Self-pay | Source: Home / Self Care | Attending: Certified Nurse Midwife

## 2023-09-18 ENCOUNTER — Inpatient Hospital Stay: Payer: Self-pay | Admitting: Urgent Care

## 2023-09-18 ENCOUNTER — Other Ambulatory Visit: Payer: Self-pay

## 2023-09-18 ENCOUNTER — Inpatient Hospital Stay
Admission: RE | Admit: 2023-09-18 | Discharge: 2023-09-21 | DRG: 787 | Disposition: A | Discharge status: Home / Self Care | Payer: Medicaid Other | Attending: Certified Nurse Midwife | Admitting: Certified Nurse Midwife

## 2023-09-18 ENCOUNTER — Inpatient Hospital Stay: Payer: Self-pay

## 2023-09-18 DIAGNOSIS — Z3480 Encounter for supervision of other normal pregnancy, unspecified trimester: Secondary | ICD-10-CM

## 2023-09-18 DIAGNOSIS — O99824 Streptococcus B carrier state complicating childbirth: Secondary | ICD-10-CM | POA: Diagnosis present

## 2023-09-18 DIAGNOSIS — O34211 Maternal care for low transverse scar from previous cesarean delivery: Secondary | ICD-10-CM | POA: Diagnosis present

## 2023-09-18 DIAGNOSIS — O0990 Supervision of high risk pregnancy, unspecified, unspecified trimester: Principal | ICD-10-CM

## 2023-09-18 DIAGNOSIS — Z8614 Personal history of Methicillin resistant Staphylococcus aureus infection: Secondary | ICD-10-CM

## 2023-09-18 DIAGNOSIS — R109 Unspecified abdominal pain: Secondary | ICD-10-CM | POA: Diagnosis not present

## 2023-09-18 DIAGNOSIS — O909 Complication of the puerperium, unspecified: Secondary | ICD-10-CM | POA: Diagnosis not present

## 2023-09-18 DIAGNOSIS — R140 Abdominal distension (gaseous): Secondary | ICD-10-CM

## 2023-09-18 DIAGNOSIS — R110 Nausea: Secondary | ICD-10-CM | POA: Diagnosis not present

## 2023-09-18 DIAGNOSIS — K219 Gastro-esophageal reflux disease without esophagitis: Secondary | ICD-10-CM | POA: Diagnosis present

## 2023-09-18 DIAGNOSIS — Z98891 History of uterine scar from previous surgery: Principal | ICD-10-CM

## 2023-09-18 DIAGNOSIS — O9962 Diseases of the digestive system complicating childbirth: Secondary | ICD-10-CM | POA: Diagnosis present

## 2023-09-18 DIAGNOSIS — Z3A39 39 weeks gestation of pregnancy: Secondary | ICD-10-CM

## 2023-09-18 DIAGNOSIS — O99214 Obesity complicating childbirth: Secondary | ICD-10-CM | POA: Diagnosis present

## 2023-09-18 DIAGNOSIS — O9902 Anemia complicating childbirth: Secondary | ICD-10-CM | POA: Diagnosis present

## 2023-09-18 DIAGNOSIS — D62 Acute posthemorrhagic anemia: Secondary | ICD-10-CM | POA: Diagnosis not present

## 2023-09-18 DIAGNOSIS — Z3490 Encounter for supervision of normal pregnancy, unspecified, unspecified trimester: Secondary | ICD-10-CM

## 2023-09-18 SURGERY — Surgical Case
Anesthesia: Spinal

## 2023-09-18 MED ORDER — PHENYLEPHRINE 80 MCG/ML (10ML) SYRINGE FOR IV PUSH (FOR BLOOD PRESSURE SUPPORT)
PREFILLED_SYRINGE | INTRAVENOUS | Status: AC
  Filled 2023-09-18: qty 10

## 2023-09-18 MED ORDER — ONDANSETRON HCL 4 MG/2ML IJ SOLN
INTRAMUSCULAR | Status: AC
  Filled 2023-09-18: qty 2

## 2023-09-18 MED ORDER — PHENYLEPHRINE HCL-NACL 20-0.9 MG/250ML-% IV SOLN
INTRAVENOUS | Status: AC
  Filled 2023-09-18: qty 250

## 2023-09-18 MED ORDER — MENTHOL 3 MG MT LOZG: 1.0000 | LOZENGE | OROMUCOSAL | Status: DC | PRN

## 2023-09-18 MED ORDER — ZOLPIDEM TARTRATE 5 MG PO TABS: 5.0000 mg | ORAL_TABLET | Freq: Every evening | ORAL | Status: DC | PRN

## 2023-09-18 MED ORDER — NALOXONE HCL 0.4 MG/ML IJ SOLN: 0.4000 mg | INTRAMUSCULAR | Status: DC | PRN

## 2023-09-18 MED ORDER — SENNOSIDES-DOCUSATE SODIUM 8.6-50 MG PO TABS
2.0000 | ORAL_TABLET | ORAL | Status: DC
  Administered 2023-09-19 – 2023-09-21 (×3): 2 via ORAL
  Filled 2023-09-18 (×4): qty 2

## 2023-09-18 MED ORDER — BUPIVACAINE IN DEXTROSE 0.75-8.25 % IT SOLN
INTRATHECAL | Status: DC | PRN
  Administered 2023-09-18: 1.5 mL via INTRATHECAL

## 2023-09-18 MED ORDER — FENTANYL CITRATE (PF) 100 MCG/2ML IJ SOLN
INTRAMUSCULAR | Status: DC | PRN
  Administered 2023-09-18: 15 ug via INTRATHECAL

## 2023-09-18 MED ORDER — DEXAMETHASONE SODIUM PHOSPHATE 10 MG/ML IJ SOLN
INTRAMUSCULAR | Status: DC | PRN
  Administered 2023-09-18: 10 mg via INTRAVENOUS

## 2023-09-18 MED ORDER — SODIUM CHLORIDE 0.9% FLUSH: 3.0000 mL | INTRAVENOUS | Status: DC | PRN

## 2023-09-18 MED ORDER — SOD CITRATE-CITRIC ACID 500-334 MG/5ML PO SOLN
ORAL | Status: AC
  Administered 2023-09-18: 30 mL
  Filled 2023-09-18: qty 15

## 2023-09-18 MED ORDER — DIPHENHYDRAMINE HCL 25 MG PO CAPS: 25.0000 mg | ORAL_CAPSULE | Freq: Four times a day (QID) | ORAL | Status: DC | PRN

## 2023-09-18 MED ORDER — LACTATED RINGERS IV SOLN: INTRAVENOUS | Status: DC

## 2023-09-18 MED ORDER — FENTANYL CITRATE (PF) 100 MCG/2ML IJ SOLN
INTRAMUSCULAR | Status: AC
  Filled 2023-09-18: qty 2

## 2023-09-18 MED ORDER — ONDANSETRON HCL 4 MG/2ML IJ SOLN
INTRAMUSCULAR | Status: DC | PRN
Start: 2023-09-18 — End: 2023-09-18
  Administered 2023-09-18: 4 mg via INTRAVENOUS

## 2023-09-18 MED ORDER — OXYTOCIN-SODIUM CHLORIDE 30-0.9 UT/500ML-% IV SOLN
INTRAVENOUS | Status: AC
  Filled 2023-09-18: qty 500

## 2023-09-18 MED ORDER — KETOROLAC TROMETHAMINE 30 MG/ML IJ SOLN
INTRAMUSCULAR | Status: AC
  Filled 2023-09-18: qty 1

## 2023-09-18 MED ORDER — OXYTOCIN-SODIUM CHLORIDE 30-0.9 UT/500ML-% IV SOLN
2.5000 [IU]/h | INTRAVENOUS | Status: AC
  Administered 2023-09-18: 2.5 [IU]/h via INTRAVENOUS

## 2023-09-18 MED ORDER — KETOROLAC TROMETHAMINE 30 MG/ML IJ SOLN: 30.0000 mg | Freq: Four times a day (QID) | INTRAMUSCULAR | Status: AC | PRN

## 2023-09-18 MED ORDER — PRENATAL MULTIVITAMIN CH
1.0000 | ORAL_TABLET | Freq: Every day | ORAL | Status: DC
  Administered 2023-09-19 – 2023-09-21 (×3): 1 via ORAL
  Filled 2023-09-18 (×3): qty 1

## 2023-09-18 MED ORDER — OXYCODONE-ACETAMINOPHEN 5-325 MG PO TABS
1.0000 | ORAL_TABLET | ORAL | Status: DC | PRN
  Administered 2023-09-19: 1 via ORAL
  Filled 2023-09-18: qty 1

## 2023-09-18 MED ORDER — IBUPROFEN 600 MG PO TABS
600.0000 mg | ORAL_TABLET | Freq: Four times a day (QID) | ORAL | Status: AC
  Administered 2023-09-19 – 2023-09-20 (×2): 600 mg via ORAL
  Filled 2023-09-18 (×3): qty 1

## 2023-09-18 MED ORDER — DEXAMETHASONE SODIUM PHOSPHATE 10 MG/ML IJ SOLN
INTRAMUSCULAR | Status: AC
  Filled 2023-09-18: qty 1

## 2023-09-18 MED ORDER — MORPHINE SULFATE (PF) 0.5 MG/ML IJ SOLN
INTRAMUSCULAR | Status: DC | PRN
  Administered 2023-09-18: 100 ug via INTRATHECAL

## 2023-09-18 MED ORDER — GLYCOPYRROLATE 0.2 MG/ML IJ SOLN
INTRAMUSCULAR | Status: AC
  Filled 2023-09-18: qty 1

## 2023-09-18 MED ORDER — SCOPOLAMINE 1 MG/3DAYS TD PT72
1.0000 | MEDICATED_PATCH | Freq: Once | TRANSDERMAL | Status: DC
  Administered 2023-09-19: 1.5 mg via TRANSDERMAL
  Filled 2023-09-18: qty 1

## 2023-09-18 MED ORDER — DIPHENHYDRAMINE HCL 25 MG PO CAPS: 25.0000 mg | ORAL_CAPSULE | ORAL | Status: DC | PRN

## 2023-09-18 MED ORDER — ACETAMINOPHEN 500 MG PO TABS
1000.0000 mg | ORAL_TABLET | Freq: Four times a day (QID) | ORAL | Status: DC
  Administered 2023-09-18 – 2023-09-19 (×2): 1000 mg via ORAL
  Filled 2023-09-18 (×3): qty 2

## 2023-09-18 MED ORDER — ONDANSETRON HCL 4 MG/2ML IJ SOLN
4.0000 mg | Freq: Three times a day (TID) | INTRAMUSCULAR | Status: DC | PRN
  Administered 2023-09-19: 4 mg via INTRAVENOUS
  Filled 2023-09-18: qty 2

## 2023-09-18 MED ORDER — PHENYLEPHRINE HCL-NACL 20-0.9 MG/250ML-% IV SOLN
INTRAVENOUS | Status: DC | PRN
  Administered 2023-09-18: 25 ug/min via INTRAVENOUS

## 2023-09-18 MED ORDER — FENTANYL CITRATE (PF) 100 MCG/2ML IJ SOLN: INTRAMUSCULAR | Status: DC | PRN

## 2023-09-18 MED ORDER — OXYTOCIN-SODIUM CHLORIDE 30-0.9 UT/500ML-% IV SOLN
INTRAVENOUS | Status: DC | PRN
  Administered 2023-09-18: 250 mL/h via INTRAVENOUS

## 2023-09-18 MED ORDER — PROMETHAZINE HCL 25 MG/ML IJ SOLN
INTRAMUSCULAR | Status: AC
  Filled 2023-09-18: qty 1

## 2023-09-18 MED ORDER — NALOXONE HCL 4 MG/10ML IJ SOLN: 1.0000 ug/kg/h | INTRAVENOUS | Status: DC | PRN

## 2023-09-18 MED ORDER — DIPHENHYDRAMINE HCL 50 MG/ML IJ SOLN: 12.5000 mg | INTRAMUSCULAR | Status: DC | PRN

## 2023-09-18 MED ORDER — SIMETHICONE 80 MG PO CHEW
80.0000 mg | CHEWABLE_TABLET | Freq: Four times a day (QID) | ORAL | Status: DC
  Administered 2023-09-18 – 2023-09-21 (×11): 80 mg via ORAL
  Filled 2023-09-18 (×13): qty 1

## 2023-09-18 MED ORDER — MORPHINE SULFATE (PF) 0.5 MG/ML IJ SOLN
INTRAMUSCULAR | Status: AC
  Filled 2023-09-18: qty 10

## 2023-09-18 MED ORDER — PHENYLEPHRINE 80 MCG/ML (10ML) SYRINGE FOR IV PUSH (FOR BLOOD PRESSURE SUPPORT)
PREFILLED_SYRINGE | INTRAVENOUS | Status: DC | PRN
  Administered 2023-09-18: 80 ug via INTRAVENOUS
  Administered 2023-09-18: 160 ug via INTRAVENOUS
  Administered 2023-09-18 (×3): 80 ug via INTRAVENOUS

## 2023-09-18 MED ORDER — LIDOCAINE 5 % EX PTCH
MEDICATED_PATCH | CUTANEOUS | Status: AC
  Filled 2023-09-18: qty 1

## 2023-09-18 SURGICAL SUPPLY — 30 items
ADHESIVE MASTISOL STRL (MISCELLANEOUS) ×1 IMPLANT
BAG COUNTER SPONGE SURGICOUNT (BAG) ×1 IMPLANT
BENZOIN TINCTURE PRP APPL 2/3 (GAUZE/BANDAGES/DRESSINGS) IMPLANT
CHLORAPREP W/TINT 26 (MISCELLANEOUS) ×2 IMPLANT
CLOSURE STERI-STRIP 1/4X4 (GAUZE/BANDAGES/DRESSINGS) IMPLANT
DRESSING TELFA 8X10 (GAUZE/BANDAGES/DRESSINGS) IMPLANT
DRSG TELFA 3X8 NADH STRL (GAUZE/BANDAGES/DRESSINGS) ×1 IMPLANT
GAUZE SPONGE 4X4 12PLY STRL (GAUZE/BANDAGES/DRESSINGS) ×1 IMPLANT
GAUZE SPONGE 4X4 16PLY XRAY LF (GAUZE/BANDAGES/DRESSINGS) IMPLANT
GLOVE PI ORTHO PRO STRL 7.5 (GLOVE) ×1 IMPLANT
GOWN STRL REUS W/ TWL LRG LVL3 (GOWN DISPOSABLE) ×2 IMPLANT
KIT TURNOVER KIT A (KITS) ×1 IMPLANT
MANIFOLD NEPTUNE II (INSTRUMENTS) ×1 IMPLANT
MAT PREVALON FULL STRYKER (MISCELLANEOUS) ×1 IMPLANT
NS IRRIG 1000ML POUR BTL (IV SOLUTION) ×1 IMPLANT
PACK C SECTION AR (MISCELLANEOUS) ×1 IMPLANT
PAD OB MATERNITY 11 LF (PERSONAL CARE ITEMS) ×1 IMPLANT
PAD PREP OB/GYN DISP 24X41 (PERSONAL CARE ITEMS) ×1 IMPLANT
RETRACTOR TRAXI PANNICULUS (MISCELLANEOUS) IMPLANT
RETRACTOR WND ALEXIS-O 25 LRG (MISCELLANEOUS) ×1 IMPLANT
RTRCTR WOUND ALEXIS O 25CM LRG (MISCELLANEOUS) ×1 IMPLANT
SCRUB CHG 4% DYNA-HEX 4OZ (MISCELLANEOUS) ×1 IMPLANT
SPONGE T-LAP 18X18 ~~LOC~~+RFID (SPONGE) ×1 IMPLANT
SUT VIC AB 0 CTX36XBRD ANBCTRL (SUTURE) ×2 IMPLANT
SUT VIC AB 1 CT1 36 (SUTURE) ×2 IMPLANT
SUT VICRYL 1-0 27IN AB (SUTURE) ×1 IMPLANT
SUT VICRYL+ 3-0 36IN CT-1 (SUTURE) ×2 IMPLANT
SUTURE VICRYL 1-0 27IN AB (SUTURE) IMPLANT
TRAP FLUID SMOKE EVACUATOR (MISCELLANEOUS) ×1 IMPLANT
WATER STERILE IRR 500ML POUR (IV SOLUTION) ×1 IMPLANT

## 2023-09-18 NOTE — Anesthesia Procedure Notes (Signed)
 Spinal  Patient location during procedure: OR Start time: 09/18/2023 7:45 AM End time: 09/18/2023 7:55 AM Reason for block: surgical anesthesia Staffing Performed: anesthesiologist  Performed by: Reed Breech, MD Authorized by: Reed Breech, MD   Preanesthetic Checklist Completed: patient identified, IV checked, site marked, risks and benefits discussed, surgical consent, monitors and equipment checked, pre-op evaluation and timeout performed Spinal Block Patient position: sitting Prep: Betadine Patient monitoring: heart rate, cardiac monitor, continuous pulse ox and blood pressure Approach: midline Location: L3-4 Injection technique: single-shot Needle Needle type: Pencan  Needle gauge: 25 G Needle length: 10 cm Assessment Sensory level: T4

## 2023-09-18 NOTE — Anesthesia Procedure Notes (Signed)
 Date/Time: 09/18/2023 8:00 AM  Performed by: Ginger Carne, CRNAPre-anesthesia Checklist: Patient identified, Emergency Drugs available, Suction available, Patient being monitored and Timeout performed Patient Re-evaluated:Patient Re-evaluated prior to induction Oxygen Delivery Method: Nasal cannula Preoxygenation: Pre-oxygenation with 100% oxygen

## 2023-09-18 NOTE — Transfer of Care (Signed)
 Immediate Anesthesia Transfer of Care Note  Patient: Rebecca Walton  Procedure(s) Performed: REPEAT CESAREAN DELIVERY  Patient Location:  Labor and delivery postop unit  Anesthesia Type:Spinal  Level of Consciousness: awake  Airway & Oxygen Therapy: Patient Spontanous Breathing  Post-op Assessment: Report given to RN and Post -op Vital signs reviewed and stable  Post vital signs: Reviewed and stable  Last Vitals:  Vitals Value Taken Time  BP 113/45   Temp    Pulse 126   Resp    SpO2 100     Last Pain:  Vitals:   09/18/23 0540  TempSrc: Oral  PainSc: 0-No pain         Complications: No notable events documented.

## 2023-09-18 NOTE — Anesthesia Preprocedure Evaluation (Addendum)
 Anesthesia Evaluation  Patient identified by MRN, date of birth, ID band Patient awake    Reviewed: Allergy & Precautions, NPO status , Patient's Chart, lab work & pertinent test results  History of Anesthesia Complications (+) PONV and history of anesthetic complications  Airway Mallampati: IV   Neck ROM: Full    Dental  (+) Chipped   Pulmonary asthma , former smoker (quit age 33)   Pulmonary exam normal breath sounds clear to auscultation       Cardiovascular Normal cardiovascular exam Rhythm:Regular Rate:Normal     Neuro/Psych negative neurological ROS     GI/Hepatic ,GERD  ,,  Endo/Other    Class 4 obesity  Renal/GU negative Renal ROS     Musculoskeletal   Abdominal   Peds  Hematology  (+) Blood dyscrasia, anemia   Anesthesia Other Findings 33 yo G3P1011 at 26 3/7 presenting for elective repeat c-section.  Reproductive/Obstetrics                             Anesthesia Physical Anesthesia Plan  ASA: 3  Anesthesia Plan: Spinal   Post-op Pain Management:    Induction:   PONV Risk Score and Plan: 3 and Ondansetron and Treatment may vary due to age or medical condition  Airway Management Planned: Natural Airway and Nasal Cannula  Additional Equipment:   Intra-op Plan:   Post-operative Plan:   Informed Consent: I have reviewed the patients History and Physical, chart, labs and discussed the procedure including the risks, benefits and alternatives for the proposed anesthesia with the patient or authorized representative who has indicated his/her understanding and acceptance.     Dental Advisory Given  Plan Discussed with: Anesthesiologist, CRNA and Surgeon  Anesthesia Plan Comments: (Patient reports no bleeding problems and no anticoagulant use.  Plan for spinal with backup GA.  Patient consented for risks of anesthesia including but not limited to:  - adverse  reactions to medications - damage to eyes, teeth, lips or other oral mucosa - nerve damage due to positioning  - risk of bleeding, infection and or nerve damage from spinal that could lead to paralysis - risk of headache or failed spinal - damage to teeth, lips or other oral mucosa - sore throat or hoarseness - damage to heart, brain, nerves, lungs, other parts of body or loss of life  Patient voiced understanding and assent.)        Anesthesia Quick Evaluation

## 2023-09-18 NOTE — H&P (Signed)
 History and Physical   HPI  Rebecca Walton is a 33 y.o. G3P1011 at [redacted]w[redacted]d Estimated Date of Delivery: 09/22/23 who is being admitted for C-section repeat.   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   Anemia affecting pregnancy 08/08/2023   Subchorionic hemorrhage in first trimester 04/24/2023   Obesity affecting pregnancy 04/24/2023   H/O cesarean section 03/14/2023   H/O postpartum hemorrhage, currently pregnant 03/14/2023   BMI 45.0-49.9, adult (HCC) 03/14/2023   Supervision of high risk pregnancy, antepartum 02/08/2023   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   Asthma 03/27/2018    Prenatal labs and studies: ABO, Rh: --/--/A POS (02/28 0929) Antibody: NEG (02/28 0929) Rubella: <0.90 (08/27 1038) RPR: NON REACTIVE (02/28 0929)  HBsAg: Negative (08/27 1038)  HIV: Non Reactive (12/18 0942)  JYN:WGNFAOZH/-- (02/11 1625)   Past Medical History:  Diagnosis Date   Anemia    Asthma    Calculus of gallbladder without cholecystitis without obstruction 08/07/2017   CIN II (cervical intraepithelial neoplasia II) 10/16/2019   05/22/2019- LSIL  10/07/19 Colpo showed CIN2  10/2019= Cyro  11/2020- pap WNL     History of methicillin resistant staphylococcus aureus (MRSA)    as a child on elbow   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   PONV (postoperative nausea and vomiting)    Postpartum anemia 07/03/2017   Vaginal Pap smear, abnormal    cryo 2021     Past Surgical History:  Procedure Laterality Date   CESAREAN SECTION N/A 06/29/2017    Procedure: CESAREAN SECTION;  Surgeon: Conard Novak, MD;  Location: [facility] ORS;  Service: Obstetrics;  Laterality: N/A;   CHOLECYSTECTOMY N/A 08/22/2017   Procedure: LAPAROSCOPIC CHOLECYSTECTOMY;  Surgeon: Ancil Linsey, MD;  Location: [facility] ORS;  Service: General;  Laterality: N/A;     Medications    Current Discharge Medication List     CONTINUE these medications which have NOT CHANGED   Details  aspirin EC 81 MG tablet Take 2 tablets (162 mg total) by mouth daily. Swallow whole. Start at 13 weeks Qty: 30 tablet, Refills: 12    Ferrous Gluconate-C-Folic Acid (IRON-C PO) Take 2 tablets by mouth daily.    Prenatal MV & Min w/FA-DHA (PRENATAL GUMMIES PO) Take 2 capsules by mouth daily.         Allergies  Coconut flavoring agent (non-screening)  Review of Systems  Pertinent items noted in HPI and remainder of comprehensive ROS otherwise negative.  Physical Exam  BP 134/88   Pulse 94   Temp 98.3 F (36.8 C) (Oral)   Resp 16   Ht 5' (1.524 m)   Wt 119.7 kg   LMP 12/16/2022 (Exact Date)   BMI 51.56 kg/m   Lungs:  CTA B Cardio: RRR without M/R/G Abd: Soft, gravid, NT Presentation: cephalic EXT: No  C/C/ 1+ Edema DTRs: 2+ B CERVIX:    See Prenatal records for more detailed PE.     FHR:  Variability: Good {> 6 bpm)  Toco: Uterine Contractions: None  Assessment   G3P1011 at [redacted]w[redacted]d Estimated Date of Delivery: 09/22/23  The fetus is reassuring.   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 Admit to L&D :   2. EFM: -- Category 1 3. Repeat CD   Elonda Husky, M.D. 09/18/2023 7:41 AM

## 2023-09-18 NOTE — Lactation Note (Signed)
 This note was copied from a baby's chart. Lactation Consultation Note  Patient Name: Rebecca Walton [id number] Date: 09/18/2023 Age:31 hours Reason for consult: Initial assessment;Term   Maternal Data Has patient been taught Hand Expression?: No Does the patient have breastfeeding experience prior to this delivery?: No  Initial assessment w/a P2 patient and a 5hr old baby Rebecca.  This was a c-section delivery.  Patient w/ a hx of exposure to syphillis, anemia, milk intellectual disability, and obesity.  Patient stated that her feeding goal is breastfeeding.  Patient expressed that she didn't get the chance to nurse with her 1st child.    Patient does not have a breastpump at home.  She expressed that she wanted to see how breastfeeding went before purchasing a pump.  Feeding Mother's Current Feeding Choice: Breast Milk and Donor Milk Nipple Type: Slow - flow  No feeding observed during this visit.  Interventions Interventions: Breast feeding basics reviewed;Education  LC provided education on the following;  milk production expectations, hunger cues, day 1/2 wet/dirty diapers, benefits of STS and arousing infant for a feeding.  Lactation informed patient of feeding infant at least 8 or more times w/in a 24hr period but not exceeding 3hrs. Patient verbalized understanding.   Discharge WIC Program: Yes  Consult Status Consult Status: Follow-up Follow-up type: In-patient    Rebecca Walton 09/18/2023, 1:51 PM

## 2023-09-18 NOTE — Lactation Note (Signed)
 This note was copied from a baby's chart. Lactation Consultation Note  Patient Name: Girl Cimone Fahey [id number] Date: 09/18/2023 Age:32 hours Reason for consult: Follow-up assessment;Mother's request;Term   Maternal Data LC requested in room to assist w/ a feeding.   Feeding Mother's Current Feeding Choice: Breast Milk and Donor Milk  LC assisted patient w/ a latch.  Infant awake but not interested in a feeding. After several attempts LC stopped and placed infant STS.  LC assisted patient with hand expression on the left breast.   Lactation Tools Discussed/Used Tools: Pump Breast pump type: Double-Electric Breast Pump Pump Education: Setup, frequency, and cleaning Reason for Pumping: Patient requested the pump Pumping frequency: Anytime infant gets a bottle there needs to be stimulation of the breast.  Both parents requested to get set up with pumping.   LC provided education on possibly not seeing anything during the pump session, but that it would be more for just stimulation.  Both parents verbalized understanding. Patient pumped out a very small amt of colostrum, and LC finger fed this to infant.   Interventions Interventions: Breast feeding basics reviewed;Assisted with latch;Hand express;Breast massage;Breast compression;Support pillows;Position options;Expressed milk;DEBP;Education  LC reviewed stimulation of the breast and feeding cues with family.  Parents slightly anxious because infant has not eaten.  Blood sugars have been good on infant.  Infant displaying normal 1st -24hr behavior.  Encouraged, family to continue offering infant the breast, and to continue doing hand expression.    Consult Status Consult Status: Follow-up Follow-up type: In-patient    Yvette Rack Sedalia Greeson 09/18/2023, 5:36 PM

## 2023-09-18 NOTE — Op Note (Signed)
     OP NOTE  Date: 09/18/2023   9:19 AM Name Rebecca Walton MR# [id number]  Preoperative Diagnosis: 1. Intrauterine pregnancy at [redacted]w[redacted]d Principal Problem:   Pregnancy  2.  Repeat  Postoperative Diagnosis: 1. Intrauterine pregnancy at [redacted]w[redacted]d, delivered 2. Viable infant 3. Remainder same as pre-op   Procedure: 1. Repeat Low-Transverse Cesarean Section  Surgeon: Elonda Husky, MD  Assistant:  Slaughterbeck CNM  Anesthesia: Spinal   EBL: 0 ml     Findings: 1) female infant, Apgar scores of 8   at 1 minute and 9   at 5 minutes and a birthweight of 117.81 ounces.    2) Normal uterus, tubes and ovaries.    Procedure:  The patient was prepped and draped in the supine position and placed under spinal anesthesia.  A transverse incision was made across the abdomen in a Pfannenstiel manner. If indicated the old scar was systematically removed with sharp dissection.  We carried the dissection down to the level of the fascia.  The fascia was incised in a curvilinear manner.  The fascia was then elevated from the rectus muscles with blunt and sharp dissection.  The rectus muscles were separated laterally exposing the peritoneum.  The peritoneum was carefully entered with care being taken to avoid bowel and bladder.  A self-retaining retractor was placed.  The visceral peritoneum was incised in a curvilinear fashion across the lower uterine segment creating a bladder flap. A transverse incision was made across the lower uterine segment and extended laterally and superiorly with blunt dissection.  Artificial rupture membranes was performed and Clear fluid was noted.  The infant was delivered from the cephalic position.  A nuchal cord was not present. After an appropriate time interval, the cord was doubly clamped and cut. Cord blood was obtained if required.  The infant was handed to the pediatric personnel  who then placed the infant under heat lamps where it was cleaned dried and suctioned as  needed. The placenta was delivered. The hysterotomy incision was then identified on ring forceps.  The uterine cavity was cleaned with a moist lap sponge.  The hysterotomy incision was closed with a running interlocking suture of Vicryl.  Hemostasis was excellent.  Pitocin was run in the IV and the uterus was found to be firm. The posterior cul-de-sac and gutters were cleaned and inspected.  Hemostasis was noted.  The fascia was then closed with a running suture of #1 Vicryl.  Hemostasis of the subcutaneous tissues was obtained using the Bovie.  The subcutaneous tissues were closed with a running suture of 000 Vicryl.  A subcuticular suture was placed.  Steri-strips were applied in the usual manner.  A Lidoderm patch was applied.  A pressure dressing was placed.  The patient went to the recovery room in stable condition. Slaughterbeck CNM provided exposure, dissection, suctioning, retraction, and general support and assistance during the procedure.   Elonda Husky, M.D. 09/18/2023 9:19 AM

## 2023-09-19 ENCOUNTER — Inpatient Hospital Stay

## 2023-09-19 LAB — CBC
HCT: 27.1 % — ABNORMAL LOW (ref 36.0–46.0)
HCT: 29.7 % — ABNORMAL LOW (ref 36.0–46.0)
Hemoglobin: 8.6 g/dL — ABNORMAL LOW (ref 12.0–15.0)
Hemoglobin: 9.3 g/dL — ABNORMAL LOW (ref 12.0–15.0)
MCH: 25.4 pg — ABNORMAL LOW (ref 26.0–34.0)
MCH: 24.8 pg — ABNORMAL LOW (ref 26.0–34.0)
MCHC: 31.7 g/dL (ref 30.0–36.0)
MCHC: 31.3 g/dL (ref 30.0–36.0)
MCV: 80.2 fL (ref 80.0–100.0)
MCV: 79.2 fL — ABNORMAL LOW (ref 80.0–100.0)
Platelets: 181 10*3/uL (ref 150–400)
Platelets: 162 10*3/uL (ref 150–400)
RBC: 3.38 MIL/uL — ABNORMAL LOW (ref 3.87–5.11)
RBC: 3.75 MIL/uL — ABNORMAL LOW (ref 3.87–5.11)
RDW: 22.8 % — ABNORMAL HIGH (ref 11.5–15.5)
RDW: 23.5 % — ABNORMAL HIGH (ref 11.5–15.5)
WBC: 9.6 10*3/uL (ref 4.0–10.5)
WBC: 9.4 10*3/uL (ref 4.0–10.5)
nRBC: 0 % (ref 0.0–0.2)
nRBC: 0 % (ref 0.0–0.2)

## 2023-09-19 MED ORDER — LACTATED RINGERS IV BOLUS
500.0000 mL | Freq: Once | INTRAVENOUS | Status: AC
  Administered 2023-09-19: 500 mL via INTRAVENOUS

## 2023-09-19 MED ORDER — ACETAMINOPHEN 500 MG PO TABS
1000.0000 mg | ORAL_TABLET | Freq: Four times a day (QID) | ORAL | Status: AC
  Administered 2023-09-19: 1000 mg via ORAL
  Filled 2023-09-19: qty 2

## 2023-09-19 MED ORDER — ONDANSETRON HCL 4 MG/2ML IJ SOLN
4.0000 mg | INTRAMUSCULAR | Status: DC | PRN
  Administered 2023-09-19: 4 mg via INTRAVENOUS
  Filled 2023-09-19: qty 2

## 2023-09-19 MED ORDER — KETOROLAC TROMETHAMINE 30 MG/ML IJ SOLN
30.0000 mg | Freq: Once | INTRAMUSCULAR | Status: AC
  Administered 2023-09-19: 30 mg via INTRAVENOUS
  Filled 2023-09-19: qty 1

## 2023-09-19 MED ORDER — IRON SUCROSE 300 MG IVPB - SIMPLE MED
300.0000 mg | Freq: Once | Status: AC
  Administered 2023-09-19: 300 mg via INTRAVENOUS
  Filled 2023-09-19: qty 300

## 2023-09-19 NOTE — Progress Notes (Signed)
 Progress Note - Cesarean Delivery  Rebecca Walton is a 33 y.o. [id number] now PP day 1 s/p C-Section, Low Transverse. Nurse called with concern of episode of low blood pressure . She states patient having nausea and vomited x 1 . She is having significant gas pain. Blood pressure at that time 98/41, which improved once pt laying down.   Subjective:  Patient  abdominal pain , gas pain, nausea, and vomiting. She states that she is passing gas. She reports the pain 9/10 currently.     Objective:  Vital signs in last 24 hours: Temp:  [97.8 F (36.6 C)-98.5 F (36.9 C)] 98.5 F (36.9 C) (03/04 1815) Pulse Rate:  [95-117] 113 (03/04 1815) Resp:  [17-24] 20 (03/04 1815) BP: (98-134)/(41-77) 130/64 (03/04 1815) SpO2:  [94 %-100 %] 96 % (03/04 1815)  Physical Exam: Abdomen distended Bowel sounds absent General: alert, cooperative, mild distress, and morbidly obese Lochia: appropriate Uterine Fundus: firm Incision: no significant drainage      Data Review Recent Labs    09/19/23 0443 09/19/23 1720  HGB 8.6* 9.3*  HCT 27.1* 29.7*    Assessment:  Principal Problem:   Pregnancy Active Problems:   Term pregnancy, repeat   Status post Cesarean section. Postoperative course complicated by abdominal pain  , gas pain, abdominal distension.     Plan:       Dr. Feliberto Gottron notified discussed plan of care. PT to be placed on clear liquids . Abdominal x ray ordered stat. . Continue to monitor.     Doreene Burke, CNM  09/19/2023 6:36 PM

## 2023-09-19 NOTE — Anesthesia Post-op Follow-up Note (Signed)
  Anesthesia Pain Follow-up Note  Patient: Rebecca Walton  Day #: 1  Date of Follow-up: 09/19/2023 Time: 8:32 AM  Last Vitals:  Vitals:   09/19/23 0500 09/19/23 0730  BP:  116/73  Pulse: (!) 104 95  Resp:  17  Temp:  36.6 C  SpO2: 96% 97%    Level of Consciousness: alert  Pain: none   Side Effects:None  Catheter Site Exam:clean, dry, no drainage     Plan: D/C from anesthesia care at surgeon's request  Starling Manns

## 2023-09-19 NOTE — Anesthesia Postprocedure Evaluation (Signed)
 Anesthesia Post Note  Patient: Rebecca Walton  Procedure(s) Performed: REPEAT CESAREAN DELIVERY  Patient location during evaluation: Mother Baby Anesthesia Type: Spinal Level of consciousness: oriented and awake and alert Pain management: pain level controlled Vital Signs Assessment: post-procedure vital signs reviewed and stable Respiratory status: spontaneous breathing and respiratory function stable Cardiovascular status: blood pressure returned to baseline and stable Postop Assessment: no headache, no backache, no apparent nausea or vomiting and able to ambulate Anesthetic complications: no   No notable events documented.   Last Vitals:  Vitals:   09/19/23 0500 09/19/23 0730  BP:  116/73  Pulse: (!) 104 95  Resp:  17  Temp:  36.6 C  SpO2: 96% 97%    Last Pain:  Vitals:   09/19/23 0730  TempSrc: Oral  PainSc: 2                  Starling Manns

## 2023-09-19 NOTE — Lactation Note (Signed)
 This note was copied from a baby's chart. Lactation Consultation Note  Patient Name: Rebecca Walton [id number] Date: 09/19/2023 Age:32 hours  Reason for consult: follow-up   Lactation Note: Per care nurse mom is only formula feeding. This afternoon mom has been having episodes of nausea and vomiting. Care nurse requests LC follow-up with mom tomorrow.  Consult Status  Inpatient Follow-up 09/20/2023   Fuller Song 09/19/2023, 6:11 PM

## 2023-09-19 NOTE — Progress Notes (Signed)
 Progress Note - Cesarean Delivery  Rebecca Walton is a 33 y.o. [id number] now PP day 1 s/p C-Section, Low Transverse.   Subjective:  Patient reports no problems with eating, bowel movements, voiding, or their wound  She did have an episode of lightheadedness when she first tried to stand up at 4 AM today.  She feels well now  Her pain is controlled with Tylenol.  Objective:  Vital signs in last 24 hours: Temp:  [97.8 F (36.6 C)-98.6 F (37 C)] 97.8 F (36.6 C) (03/04 0730) Pulse Rate:  [95-160] 110 (03/04 0800) Resp:  [12-36] 17 (03/04 0730) BP: (90-134)/(34-77) 116/73 (03/04 0730) SpO2:  [90 %-100 %] 96 % (03/04 0800)  Physical Exam:  General: alert, cooperative, and no distress Lochia: appropriate Uterine Fundus: firm Incision: Dressing intact    Data Review Recent Labs    09/19/23 0443  HGB 8.6*  HCT 27.1*    Assessment:  Principal Problem:   Pregnancy Active Problems:   Term pregnancy, repeat   Status post Cesarean section. Doing well postoperatively.     Plan:       Slowly out of bed today.  Expect her to do well.  May shower. Plan dressing change after shower.    Elonda Husky, M.D. 09/19/2023 9:10 AM

## 2023-09-20 NOTE — Lactation Note (Addendum)
 This note was copied from a baby's chart. Lactation Consultation Note  Patient Name: Rebecca Walton Date: 09/20/2023 Age:32 hours Reason for consult: Follow-up assessment;Term   Maternal Data Mother verifies she desires to formula feed her 48 hour infant and accepted suppression education. Pt acknowledges she should not stimulate breast and should only hand express for relief while also incorporating 15-20 minute ice pack sessions around 4 times a day. Mother states that she has a snug pumping bra she will use to help with suppression, a snug sports bra was also encouraged.   Feeding Mother's Current Feeding Choice: Formula   Discharge Discharge Education: Engorgement and breast care;Outpatient recommendation Discussed engorgement treatment and signs of mastitis. Outpatient lactation number provided for future assistance needed.   Consult Status Consult Status: Follow-up Date: 09/20/23 Follow-up type: Call as needed    Chinita Greenland 09/20/2023, 9:27 AM

## 2023-09-20 NOTE — Progress Notes (Signed)
 Foley removed. Patient wanting to try and sit at edge of bed. Assisted patient to edge of bed. Patient started to experience some nausea that would come and go. Patient did not experience any vomiting with this episode. Patient wanted to remain sitting at edge of bed. Patient denied any dizziness but was experiencing what she explained has sharp gas pains no cramping. This RN asked if she thought she needed to have a bowel movement, patient stated possibly. RN offered bedside commode and patient agreed. Patient up to bedside commode. Patient passed a large amount of gas, voided and had a bowel movement. Patient asked to sit there for a little bit with some privacy to try and pass more gas. When returning back to room patient looked like she was more comfortable. She stated she felt better and nausea was gone. Patient then was up to take a lap around the nurses station. This RN assisted her around the nurses station without issues. Patient tolerated well and back to room. Will continue to monitor. Call bell in reach,

## 2023-09-20 NOTE — Progress Notes (Signed)
 Progress Note - Cesarean Delivery  Rebecca Walton is a 33 y.o. [id number] now PP day 2 s/p C-Section, Low Transverse.   Subjective:  She continued to experience occasional nausea with 1 episode of vomiting yesterday.  Her abdomen was found to be distended with minimal bowel sounds.  A flatplate abdomen was obtained.  Earlier this morning patient was able to have a bowel movement and reportedly passed a large amount of gas and immediately felt better.  Since that time she reports she had another bowel movement and is feeling back to herself.  She reports that she can get in and out of bed without lightheadedness and is using the bathroom without issue.  She reports her pain is much improved. Objective:  Vital signs in last 24 hours: Temp:  [97.8 F (36.6 C)-98.5 F (36.9 C)] 98.4 F (36.9 C) (03/05 0808) Pulse Rate:  [90-113] 90 (03/05 0808) Resp:  [18-24] 20 (03/05 0808) BP: (98-135)/(41-79) 118/72 (03/05 0808) SpO2:  [93 %-100 %] 96 % (03/05 8119)  Physical Exam: Her abdomen is soft and nontender.  She has active bowel sounds in all quadrants.  It is nondistended. No issues with her abdominal incision/wound.  Data Review Recent Labs    09/19/23 0443 09/19/23 1720  HGB 8.6* 9.3*  HCT 27.1* 29.7*    Assessment:  Principal Problem:   Pregnancy Active Problems:   Term pregnancy, repeat   Patient now passing gas and having bowel movements without issue.  Pain and lightheadedness have resolved.  Hemoglobin has improved after iron infusion. Plan:       Out of bed today.  Slowly increase diet.  Consider discharge tomorrow.  Rebecca Walton, M.D. 09/20/2023 8:29 AM

## 2023-09-21 ENCOUNTER — Encounter: Payer: Self-pay | Admitting: Obstetrics and Gynecology

## 2023-09-21 MED ORDER — SENNOSIDES-DOCUSATE SODIUM 8.6-50 MG PO TABS: 2.0000 | ORAL_TABLET | ORAL | Status: DC

## 2023-09-21 MED ORDER — SIMETHICONE 80 MG PO CHEW: 80.0000 mg | CHEWABLE_TABLET | Freq: Four times a day (QID) | ORAL | Status: DC

## 2023-09-21 MED ORDER — ACETAMINOPHEN 500 MG PO TABS
1000.0000 mg | ORAL_TABLET | Freq: Once | ORAL | Status: AC
  Administered 2023-09-21: 1000 mg via ORAL
  Filled 2023-09-21: qty 2

## 2023-09-21 MED ORDER — IBUPROFEN 600 MG PO TABS: 600.0000 mg | ORAL_TABLET | Freq: Four times a day (QID) | ORAL | Status: DC

## 2023-09-21 MED ORDER — OXYCODONE-ACETAMINOPHEN 5-325 MG PO TABS
1.0000 | ORAL_TABLET | ORAL | 0 refills | Status: DC | PRN
Start: 2023-09-21 — End: 2023-10-31

## 2023-09-21 NOTE — Discharge Summary (Signed)
 Postpartum Discharge Summary  Date of Service updated 09/21/2023   Patient Name: Rebecca Walton DOB: September 20, 1990 MRN: [id number]  Date of admission: 09/18/2023 Delivery date:09/18/2023 Delivering provider: Linzie Collin Date of discharge: 09/21/2023  Admitting diagnosis: Pregnancy [Z34.90] Term pregnancy, repeat [Z34.90] Intrauterine pregnancy: [redacted]w[redacted]d     Secondary diagnosis:  Principal Problem:   Pregnancy Active Problems:   Term pregnancy, repeat    Discharge diagnosis: Term Pregnancy Delivered via [facility]                                             Post partum procedures: n;a Augmentation: N/A Complications: None  Hospital course: Sceduled C/S   33 y.o. yo [name] at [redacted]w[redacted]d was admitted to the hospital 09/18/2023 for scheduled cesarean section with the following indication:Elective Repeat.  Delivery details are as follows:  Membrane Rupture Time/Date: 8:35 AM,09/18/2023  Delivery Time: 8:36 09/18/2023 Delivery Method:C-Section, Low Transverse Operative Delivery:N/A Details of operation can be found in separate operative note.  Rebecca Walton has had an uncomplicated postpartum course.  She is ambulating, tolerating a regular diet, passing flatus, and urinating well. Her pain is well controlled with ibuprofen and tylenol. Rebecca Walton is formula feeding. She is eager to get home today. Patient is discharged home in stable condition on  09/21/23.         Newborn Data: Birth date:09/18/2023 Birth time:8:36 AM Gender:Female Living status:Living Apgars:8 ,9  Weight:3340 g    Magnesium Sulfate received: No BMZ received: No Rhophylac:No MMR:N/A T-DaP: unknown Flu: No RSV Vaccine received: No Transfusion:No Immunizations administered: Immunization History  Administered Date(s) Administered   Moderna Sars-Covid-2 Vaccination 04/07/2020    Physical exam  Vitals:   09/20/23 0808 09/20/23 1522 09/21/23 0002 09/21/23 0824  BP: 118/72 112/64 (!) 107/55 (!) 129/92  Pulse: 90 98 84 98  Resp: 20 20  18 19   Temp: 98.4 F (36.9 C) 99 F (37.2 C) (!) 97.5 F (36.4 C) 98.3 F (36.8 C)  TempSrc: Oral Oral  Oral  SpO2: 96%  100% 99%  Weight:      Height:       General: alert, cooperative, and no distress Cardiac: RRR, no murmurs, rubs, gallops Resp: CTAB Breast: deferred per pt.  GI/GU: bowel sounds active in all four quadrants, passing flatus, urinating without difficulty Lochia: appropriate Uterine Fundus: firm Incision: Healing well with no significant drainage DVT Evaluation: No evidence of DVT seen on physical exam. Labs: Lab Results  Component Value Date   WBC 9.4 09/19/2023   HGB 9.3 (L) 09/19/2023   HCT 29.7 (L) 09/19/2023   MCV 79.2 (L) 09/19/2023   PLT 162 09/19/2023      Latest Ref Rng & Units 02/21/2023    6:29 PM  CMP  Glucose 70 - 99 mg/dL 94   BUN 6 - 20 mg/dL 8   Creatinine 2.95 - 6.21 mg/dL 3.08   Sodium 657 - 846 mmol/L 137   Potassium 3.5 - 5.1 mmol/L 4.0   Chloride 98 - 111 mmol/L 104   CO2 22 - 32 mmol/L 23   Calcium 8.9 - 10.3 mg/dL 8.9   Total Protein 6.5 - 8.1 g/dL 7.3   Total Bilirubin 0.3 - 1.2 mg/dL 0.4   Alkaline Phos 38 - 126 U/L 77   AST 15 - 41 U/L 20   ALT 0 - 44 U/L 20  Edinburgh Score:    09/19/2023   11:49 AM  Edinburgh Postnatal Depression Scale Screening Tool  I have been able to laugh and see the funny side of things. 0  I have looked forward with enjoyment to things. 0  I have blamed myself unnecessarily when things went wrong. 0  I have been anxious or worried for no good reason. 0  I have felt scared or panicky for no good reason. 0  Things have been getting on top of me. 0  I have been so unhappy that I have had difficulty sleeping. 0  I have felt sad or miserable. 0  I have been so unhappy that I have been crying. 0  The thought of harming myself has occurred to me. 0  Edinburgh Postnatal Depression Scale Total 0      After visit meds:  Allergies as of 09/21/2023       Reactions   Coconut Flavoring Agent  (non-screening) Swelling   Throat swells and closes.        Medication List     TAKE these medications    aspirin EC 81 MG tablet Take 2 tablets (162 mg total) by mouth daily. Swallow whole. Start at 13 weeks What changed:  how much to take when to take this additional instructions   ibuprofen 600 MG tablet Commonly known as: ADVIL Take 1 tablet (600 mg total) by mouth every 6 (six) hours.   IRON-C PO Take 2 tablets by mouth daily.   oxyCODONE-acetaminophen 5-325 MG tablet Commonly known as: PERCOCET/ROXICET Take 1-2 tablets by mouth every 4 (four) hours as needed for moderate pain (pain score 4-6).   PRENATAL GUMMIES PO Take 2 capsules by mouth daily.   senna-docusate 8.6-50 MG tablet Commonly known as: Senokot-S Take 2 tablets by mouth daily.   simethicone 80 MG chewable tablet Commonly known as: MYLICON Chew 1 tablet (80 mg total) by mouth 4 (four) times daily.               Discharge Care Instructions  (From admission, onward)           Start     Ordered   09/21/23 0000  Discharge wound care:       Comments: SHOWER DAILY Wash incision gently with soap and water.  Call office with any drainage, redness, or firmness of the incision.   09/21/23 1554             Discharge home in stable condition Infant Feeding: Bottle, formula Infant Disposition:home with mother Discharge instruction: per After Visit Summary and Postpartum booklet. Activity: Advance as tolerated. Pelvic rest for 6 weeks.  Diet: routine diet Anticipated Birth Control: POPs Postpartum Appointment:1 week incision check, 6 week in person follow up Additional Postpartum F/U:  n/a Future Appointments: Future Appointments  Date Time Provider Department Center  09/26/2023  9:35 AM Linzie Collin, MD [facility] None       Cindra Eves, SNM 09/21/2023 Ellouise Newer Ayaansh Smail, CNM present for all portions of care.

## 2023-09-21 NOTE — Discharge Instructions (Signed)

## 2023-09-21 NOTE — Significant Event (Signed)
 At discharge pt reported to RN having a headache. BP 133/79 with heart rate 110's.  RN concerned for tachycardia.  Pt sitting at the side of the bed, reports she had a headache a few hours ago, which went away on its own. It now back, it is over the front of her face and dull she rates it about "2/10".  Denies SOB or visual disturbances. Pt reports she has not slept much in the last 24 hours. Of note, pt's heart rate has been in the 90's to 110's since admission.   GEN: Tired appearing.  Lungs CTAB Heart: irregular fast heart rate, no skips, gallops or murmurs  EKG: sinus tachycardia.   Reviewed assessment with Dr Logan Bores. Ok to discharge home.  Encouraged pt to go home, rest, hydrate, continue Iron supplements, use Sudafed or Benadryl as needed for sinus complaints.  Warning sings reviewed.   Carie Caddy, CNM   [facility]  09/21/2023  5:27 PM

## 2023-09-26 ENCOUNTER — Encounter: Payer: Self-pay | Admitting: Obstetrics and Gynecology

## 2023-09-26 ENCOUNTER — Ambulatory Visit (INDEPENDENT_AMBULATORY_CARE_PROVIDER_SITE_OTHER): Payer: Medicaid Other | Admitting: Obstetrics and Gynecology

## 2023-09-26 VITALS — BP 124/84 | HR 101 | Ht 60.0 in | Wt 243.1 lb

## 2023-09-26 DIAGNOSIS — Z9889 Other specified postprocedural states: Secondary | ICD-10-CM

## 2023-09-26 NOTE — Progress Notes (Signed)
 HPI:      Ms. Lovinia Walton is a 33 y.o. [id number] who LMP was No LMP recorded.  Subjective:   She presents today 1 week from cesarean delivery.  She reports she is doing well.  She has not had any further episodes of lightheadedness.  She is eating voiding having bowel movements without difficulty.  She reports there is some redness around her incision.  She does have the honeycomb dressing in place.  She is bottlefeeding.    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 She does not have any pertinent problems on file. She  has a past surgical history that includes Cesarean section (N/A, 06/29/2017); Cholecystectomy (N/A, 08/22/2017); and Cesarean section (N/A, 09/18/2023). Her family history includes Anemia in her mother and sister; Asthma in her sister; Cerebral palsy in her maternal aunt; Diabetes in her maternal grandfather and paternal grandmother; Epilepsy in her maternal aunt; Healthy in her brother, father, maternal grandmother, paternal grandfather, and sister. She  reports that she has never smoked. She has never used smokeless tobacco. She reports that she does not currently use alcohol. She reports that she does not use drugs. She has a current medication list which includes the following prescription(s): ferrous gluconate-c-folic acid, ibuprofen, and oxycodone-acetaminophen. She is allergic to coconut flavoring agent (non-screening).       Review of Systems:  Review of Systems  Constitutional: Denied constitutional symptoms, night  sweats, recent illness, fatigue, fever, insomnia and weight loss.  Eyes: Denied eye symptoms, eye pain, photophobia, vision change and visual disturbance.  Ears/Nose/Throat/Neck: Denied ear, nose, throat or neck symptoms, hearing loss, nasal discharge, sinus congestion and sore throat.  Cardiovascular: Denied cardiovascular symptoms, arrhythmia, chest pain/pressure, edema, exercise intolerance, orthopnea and palpitations.  Respiratory: Denied pulmonary symptoms, asthma, pleuritic pain, productive sputum, cough, dyspnea and wheezing.  Gastrointestinal: Denied, gastro-esophageal reflux, melena, nausea and vomiting.  Genitourinary: Denied genitourinary symptoms including symptomatic vaginal discharge, pelvic relaxation issues, and urinary complaints.  Musculoskeletal: Denied musculoskeletal symptoms, stiffness, swelling, muscle weakness and myalgia.  Dermatologic: Denied dermatology symptoms, rash and scar.  Neurologic: Denied neurology symptoms, dizziness, headache, neck pain and syncope.  Psychiatric: Denied psychiatric symptoms, anxiety and depression.  Endocrine: Denied endocrine symptoms including hot flashes and night sweats.   Meds:   Current Outpatient Medications on File Prior to Visit  Medication Sig Dispense Refill   Ferrous Gluconate-C-Folic Acid (IRON-C PO) Take 2 tablets by mouth daily.     ibuprofen (ADVIL) 600 MG tablet Take 1 tablet (600 mg total) by mouth every 6 (six) hours.     oxyCODONE-acetaminophen (PERCOCET/ROXICET) 5-325 MG tablet Take 1-2 tablets by mouth every 4 (four) hours as needed for moderate pain (pain score 4-6). 10 tablet 0   No current facility-administered medications on file prior to visit.      Objective:     Vitals:   09/26/23 0939  BP: 124/84  Pulse: (!) 101   Filed Weights   09/26/23 0939  Weight: 243 lb 1.6 oz (110.3 kg)               Abdomen: Soft.  Non-tender.  No masses.  No HSM.  Incision/s: Intact.  Healing well.  There is erythema  of the honeycomb dressing has pulled her skin.  This is not the appearance of infection.  The incision itself is perfect without erythema.  No drainage.             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 Postoperative state   2. Postpartum care following cesarean delivery     Excellent recovery.   Plan:            1.  Wound care discussed. -Keep incision dry with a clean washcloth or sanitary pad as instructed. Orders No orders of the defined types were placed in this encounter.   No orders of the defined types were placed in this encounter.     F/U  Return in about 5 weeks (around 10/31/2023).  Elonda Husky, M.D. 09/26/2023 10:31 AM

## 2023-09-26 NOTE — Progress Notes (Signed)
 Patient presents today for 1 week postpartum follow-up. Patient had a cesarean delivery on 09/18/23. Patient states baby is bottle feeding. She states she would like Micronor for birth control. EPDS score of 0. She states no other questions or concerns at this time.

## 2023-09-28 DIAGNOSIS — Z0289 Encounter for other administrative examinations: Secondary | ICD-10-CM

## 2023-10-31 ENCOUNTER — Encounter: Payer: Self-pay | Admitting: Obstetrics and Gynecology

## 2023-10-31 ENCOUNTER — Ambulatory Visit: Admitting: Obstetrics and Gynecology

## 2023-10-31 VITALS — BP 120/80 | HR 71 | Ht 60.0 in | Wt 228.2 lb

## 2023-10-31 DIAGNOSIS — Z3041 Encounter for surveillance of contraceptive pills: Secondary | ICD-10-CM | POA: Diagnosis not present

## 2023-10-31 DIAGNOSIS — Z9889 Other specified postprocedural states: Secondary | ICD-10-CM

## 2023-10-31 MED ORDER — DESOGESTREL-ETHINYL ESTRADIOL 0.15-0.02/0.01 MG (21/5) PO TABS: 1.0000 | ORAL_TABLET | Freq: Every day | ORAL | 2 refills | Status: DC

## 2023-10-31 NOTE — Progress Notes (Signed)
 HPI:      Rebecca Walton is a 33 y.o. [id number] who LMP was No LMP recorded.  Subjective:   She presents today 6 weeks for repeat cesarean delivery.  She reports she is doing well.  She is bottlefeeding full-time.  She says she is getting good rest.  Eating normally having bowel movements and urinating without problem.  Reports no abdominal pain. She is currently on her menstrual period.  She desires birth control pills.    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 She does not have any pertinent problems on file. She  has a past surgical history that includes Cesarean section (N/A, 06/29/2017); Cholecystectomy (N/A, 08/22/2017); and Cesarean section (N/A, 09/18/2023). Her family history includes Anemia in her mother and sister; Asthma in her sister; Cerebral palsy in her maternal aunt; Diabetes in her maternal grandfather and paternal grandmother; Epilepsy in her maternal aunt; Healthy in her brother, father, maternal grandmother, paternal grandfather, and sister. She  reports that she has never smoked. She has never used smokeless tobacco. She reports that she does not currently use alcohol. She reports that she does not use drugs. She has a current medication list which includes the following prescription(s): desogestrel-ethinyl estradiol, ferrous gluconate-c-folic acid, and ibuprofen. She is allergic to coconut flavoring agent (non-screening).       Review of Systems:  Review of Systems  Constitutional: Denied constitutional symptoms,  night sweats, recent illness, fatigue, fever, insomnia and weight loss.  Eyes: Denied eye symptoms, eye pain, photophobia, vision change and visual disturbance.  Ears/Nose/Throat/Neck: Denied ear, nose, throat or neck symptoms, hearing loss, nasal discharge, sinus congestion and sore throat.  Cardiovascular: Denied cardiovascular symptoms, arrhythmia, chest pain/pressure, edema, exercise intolerance, orthopnea and palpitations.  Respiratory: Denied pulmonary symptoms, asthma, pleuritic pain, productive sputum, cough, dyspnea and wheezing.  Gastrointestinal: Denied, gastro-esophageal reflux, melena, nausea and vomiting.  Genitourinary: Denied genitourinary symptoms including symptomatic vaginal discharge, pelvic relaxation issues, and urinary complaints.  Musculoskeletal: Denied musculoskeletal symptoms, stiffness, swelling, muscle weakness and myalgia.  Dermatologic: Denied dermatology symptoms, rash and scar.  Neurologic: Denied neurology symptoms, dizziness, headache, neck pain and syncope.  Psychiatric: Denied psychiatric symptoms, anxiety and depression.  Endocrine: Denied endocrine symptoms including hot flashes and night sweats.   Meds:   Current Outpatient Medications on File Prior to Visit  Medication Sig Dispense Refill   Ferrous Gluconate-C-Folic Acid (IRON-C PO) Take 2 tablets by mouth daily.     ibuprofen (ADVIL) 600 MG tablet Take 1 tablet (600 mg total) by mouth every 6 (six) hours. (Patient not taking: Reported on 10/31/2023)     No current facility-administered medications on file prior to visit.      Objective:     Vitals:   10/31/23 0940  BP: 120/80  Pulse: 71   Filed Weights   10/31/23 0940  Weight: 228 lb 3.2 oz (103.5 kg)               Abdomen: Soft.  Non-tender.  No masses.  No HSM.  Incision/s: Intact.  Healing well.  No erythema.  No drainage.  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 Postoperative state   2. Postpartum care following cesarean delivery   3. Surveillance for birth control, oral contraceptives     Excellent recovery postop and postpartum   Plan:            1.  May resume normal activities with exception of heavy lifting.  2. OCPs The risks /benefits of OCPs have been explained to the patient in detail.  Product literature has been given to her where appropriate.  I have instructed her in the use of OCPs.  I have explained to the patient that OCPs are not as effective for birth control during the first month of use, and that another form of contraception should be used during this time.  Both first-day start and Sunday start have been explained.  The risks and benefits of each was discussed.  She has been made aware of  the fact that in rare circumstances, other medications may affect the efficacy of OCPs.  I have answered all of her questions, and I believe that she has an understanding of the effectiveness and use of OCPs. Will start Mircette Orders No orders of the defined types were placed in this encounter.    Meds ordered this encounter  Medications   desogestrel-ethinyl estradiol (MIRCETTE) 0.15-0.02/0.01 MG (21/5) tablet    Sig: Take 1 tablet by mouth at bedtime.    Dispense:  84 tablet    Refill:  2      F/U  Return in about 3 months (around 01/30/2024) for Annual Physical.  Delice Felt, M.D. 10/31/2023 9:56  AM

## 2023-10-31 NOTE — Progress Notes (Signed)
 Patient presents today for 6 week postpartum follow-up. Patient had a caesarean delivery on 09/18/23.  Baby is bottle feeding. She states she would like Micronor for birth control. EPDS score of  0.  She states no other questions or concerns at this time.

## 2024-01-25 ENCOUNTER — Ambulatory Visit (LOCAL_COMMUNITY_HEALTH_CENTER)

## 2024-01-25 VITALS — BP 118/65 | Ht 60.0 in | Wt 234.0 lb

## 2024-01-25 DIAGNOSIS — Z3201 Encounter for pregnancy test, result positive: Secondary | ICD-10-CM | POA: Diagnosis not present

## 2024-01-25 DIAGNOSIS — Z308 Encounter for other contraceptive management: Secondary | ICD-10-CM | POA: Diagnosis not present

## 2024-01-25 LAB — PREGNANCY, URINE: Preg Test, Ur: POSITIVE — AB

## 2024-01-25 MED ORDER — PRENATAL 27-0.8 MG PO TABS: 1.0000 | ORAL_TABLET | Freq: Every day | ORAL | Status: AC

## 2024-01-25 NOTE — Progress Notes (Signed)
 UPT positive. Plans prenatal care at [facility]. Advised to establish care soon. Positive preg packet given and reviewed.   Hx Blood transfusion after delivery 2018. Hx Anemia. Currently takes iron  supplement. Consult A Streilein, PA-C and informed of patient status, hx, meds. Per provider, ok to continue iron  supplement along with prenatal vitamin. Advises to establish prenatal care.   RN discussed provider recommendation with patient and she is in agreement.   The patient was dispensed prenatal vitamins #100 today per [facility] Dr JAYSON Helling. I provided counseling today regarding the medication. We discussed the medication, the side effects and when to call clinic. Patient given the opportunity to ask questions. Questions answered.    Patient unable to stay for presumptive elig/preg women medicaid. States plans to call [facility] at later time to discuss. Reniya Mcclees, RN

## 2024-01-29 ENCOUNTER — Telehealth

## 2024-01-29 VITALS — Wt 235.0 lb

## 2024-01-29 DIAGNOSIS — Z3687 Encounter for antenatal screening for uncertain dates: Secondary | ICD-10-CM

## 2024-01-29 DIAGNOSIS — Z3480 Encounter for supervision of other normal pregnancy, unspecified trimester: Secondary | ICD-10-CM

## 2024-01-29 DIAGNOSIS — Z3689 Encounter for other specified antenatal screening: Secondary | ICD-10-CM

## 2024-01-29 NOTE — Progress Notes (Addendum)
 New OB Intake  I connected with  Olam Shave on 01/29/24 at  8:15 AM EDT by MyChart Video Visit and verified that I am speaking with the correct person using two identifiers. Nurse is located at [facility] and pt is located at work.  I discussed the limitations, risks, security and privacy concerns of performing an evaluation and management service by telephone and the availability of in person appointments. I also discussed with the patient that there may be a patient responsible charge related to this service. The patient expressed understanding and agreed to proceed.  I explained I am completing New OB Intake today. We discussed her EDD of 09/10/2024 that is based on LMP of 12/05/2023. Pt is G7/P6. I reviewed her allergies, medications, Medical/Surgical/OB history, and appropriate screenings. There are cats in the home: no. Based on history, this is a/an pregnancy uncomplicated . Her obstetrical history is significant for obesity.  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  Anatomy US  Explained first scheduled US  will be 02/12/2024. Anatomy US  will be scheduled around [redacted] weeks gestational age.  Labs Discussed genetic screening with patient. Patient will plan on getting genetic testing to be drawn at new OB visit. Discussed  possible labs to be drawn at new OB appointment.  COVID Vaccine Patient has had COVID vaccine.   Social Determinants of Health Food Insecurity: denies food insecurity WIC Referral: Patient is not interested in referral to [facility].  Transportation: Patient denies transportation needs. Childcare: Discussed no children allowed at ultrasound appointments.   First visit review I reviewed new OB appt with pt. I explained she will have blood work and pap smear/pelvic exam if indicated. Explained pt will be seen by Eleanor Canny 02/12/2024 at first visit; encounter routed to appropriate provider.   Rollo JINNY Maxin, CMA 01/29/2024  8:39 AM

## 2024-01-29 NOTE — Patient Instructions (Signed)
 First Trimester of Pregnancy  The first trimester of pregnancy starts on the first day of your last monthly period until the end of week 13. This is months 1 through 3 of pregnancy. A week after a sperm fertilizes an egg, the egg will implant into the wall of the uterus and begin to develop into a baby. Body changes during your first trimester Your body goes through many changes during pregnancy. The changes usually return to normal after your baby is born. Physical changes Your breasts may grow larger and may hurt. The area around your nipples may get darker. Your periods will stop. Your hair and nails may grow faster. You may pee more often. Health changes You may tire easily. Your gums may bleed and may be sensitive when you brush and floss. You may not feel hungry. You may have heartburn. You may throw up or feel like you may throw up. You may want to eat some foods, but not others. You may have headaches. You may have trouble pooping (constipation). Other changes Your emotions may change from day to day. You may have more dreams. Follow these instructions at home: Medicines Talk to your health care provider if you're taking medicines. Ask if the medicines are safe to take during pregnancy. Your provider may change the medicines that you take. Do not take any medicines unless told to by your provider. Take a prenatal vitamin that has at least 600 micrograms (mcg) of folic acid. Do not use herbal medicines, illegal substances, or medicines that are not approved by your provider. Eating and drinking While you're pregnant your body needs extra food for your growing baby. Talk with your provider about what to eat while pregnant. Activity Most women are able to exercise during pregnancy. Exercises may need to change as your pregnancy goes on. Talk to your provider about your activities and exercise routines. Relieving pain and discomfort Wear a good, supportive bra if your breasts  hurt. Rest with your legs raised if you have leg cramps or low back pain. Safety Wear your seatbelt at all times when you're in a car. Talk to your provider if someone hits you, hurts you, or yells at you. Talk with your provider if you're feeling sad or have thoughts of hurting yourself. Lifestyle Certain things can be harmful while you're pregnant. Follow these rules: Do not use hot tubs, steam rooms, or saunas. Do not douche. Do not use tampons or scented pads. Do not drink alcohol,smoke, vape, or use products with nicotine or tobacco in them. If you need help quitting, talk with your provider. Avoid cat litter boxes and soil used by cats. These things carry germs that can cause harm to your pregnancy and your baby. General instructions Keep all follow-up visits. It helps you and your unborn baby stay as healthy as possible. Write down your questions. Take them to your visits. Your provider will: Talk with you about your overall health. Give you advice or refer you to specialists who can help with different needs, including: Prenatal education classes. Mental health and counseling. Foods and healthy eating. Ask for help if you need help with food. Call your dentist and ask to be seen. Brush your teeth with a soft toothbrush. Floss gently. Where to find more information American Pregnancy Association: americanpregnancy.org [employer]: acog.org Office on [employer] Health: [employer] Contact a health care provider if: You feel dizzy, faint, or have a fever. You vomit or have watery poop (diarrhea) for 2  days or more. You have abnormal discharge or bleeding from your vagina. You have pain when you pee or your pee smells bad. You have cramps, pain, or pressure in your belly area. Get help right away if: You have trouble breathing or chest pain. You have any kind of injury, such as from a fall or a car crash. These symptoms may be an  emergency. Get help right away. Call 911. Do not wait to see if the symptoms will go away. Do not drive yourself to the hospital. This information is not intended to replace advice given to you by your health care provider. Make sure you discuss any questions you have with your health care provider. Document Revised: 04/06/2023 Document Reviewed: 11/04/2022 Elsevier Patient Education  2024 [employer]   Common Medications Safe in Pregnancy  Acne:      Constipation:  Benzoyl Peroxide     Colace  Clindamycin      Dulcolax Suppository  Topica Erythromycin     Fibercon  Salicylic Acid      Metamucil         Miralax AVOID:        Senakot   Accutane    Cough:  Retin-A       Cough Drops  Tetracycline      Phenergan w/ Codeine if Rx  Minocycline      Robitussin (Plain & DM)  Antibiotics:     Crabs/Lice:  Ceclor       RID  Cephalosporins    AVOID:  E-Mycins      Kwell  Keflex  Macrobid/Macrodantin   Diarrhea:  Penicillin      Kao-Pectate  Zithromax      Imodium AD         PUSH FLUIDS AVOID:       Cipro     Fever:  Tetracycline      Tylenol (Regular or Extra  Minocycline       Strength)  Levaquin      Extra Strength-Do not          Exceed 8 tabs/24 hrs Caffeine:        200mg /day (equiv. To 1 cup of coffee or  approx. 3 12 oz sodas)         Gas: Cold/Hayfever:       Gas-X  Benadryl      Mylicon  Claritin       Phazyme  **Claritin-D        Chlor-Trimeton    Headaches:  Dimetapp      ASA-Free Excedrin  Drixoral-Non-Drowsy     Cold Compress  Mucinex (Guaifenasin)     Tylenol (Regular or Extra  Sudafed/Sudafed-12 Hour     Strength)  **Sudafed PE Pseudoephedrine   Tylenol Cold & Sinus     Vicks Vapor Rub  Zyrtec  **AVOID if Problems With Blood Pressure         Heartburn: Avoid lying down for at least 1 hour after meals  Aciphex      Maalox     Rash:  Milk of Magnesia     Benadryl    Mylanta       1% Hydrocortisone Cream  Pepcid  Pepcid Complete   Sleep  Aids:  Prevacid      Ambien   Prilosec       Benadryl  Rolaids       Chamomile Tea  Tums (Limit 4/day)     Unisom  Tylenol PM         Warm milk-add vanilla or  Hemorrhoids:       Sugar for taste  Anusol/Anusol H.C.  (RX: Analapram 2.5%)  Sugar Substitutes:  Hydrocortisone OTC     Ok in moderation  Preparation H      Tucks        Vaseline lotion applied to tissue with wiping    Herpes:     Throat:  Acyclovir      Oragel  Famvir  Valtrex     Vaccines:         Flu Shot Leg Cramps:       *Gardasil  Benadryl      Hepatitis A         Hepatitis B Nasal Spray:       Pneumovax  Saline Nasal Spray     Polio Booster         Tetanus Nausea:       Tuberculosis test or PPD  Vitamin B6 25 mg TID   AVOID:    Dramamine      *Gardasil  Emetrol       Live Poliovirus  Ginger Root 250 mg QID    MMR (measles, mumps &  High Complex Carbs @ Bedtime    rebella)  Sea Bands-Accupressure    Varicella (Chickenpox)  Unisom 1/2 tab TID     *No known complications           If received before Pain:         Known pregnancy;   Darvocet       Resume series after  Lortab        Delivery  Percocet    Yeast:   Tramadol      Femstat  Tylenol 3      Gyne-lotrimin  Ultram       Monistat  Vicodin           MISC:         All Sunscreens           Hair Coloring/highlights          Insect Repellant's          (Including DEET)         Mystic Tans   Commonly Asked Questions During Pregnancy   Cats: A parasite can be excreted in cat feces.  To avoid exposure you need to have another person empty the little box.  If you must empty the litter box you will need to wear gloves.  Wash your hands after handling your cat.  This parasite can also be found in raw or undercooked meat so this should also be avoided.  Colds, Sore Throats, Flu: Please check your medication sheet to see what you can take for symptoms.  If your symptoms are unrelieved by these medications please call the office.  Dental Work: Most  any dental work Agricultural consultant recommends is permitted.  X-rays should only be taken during the first trimester if absolutely necessary.  Your abdomen should be shielded with a lead apron during all x-rays.  Please notify your provider prior to receiving any x-rays.  Novocaine is fine; gas is not recommended.  If your dentist requires a note from [place] prior to dental work please call the office and we will provide one for you.  Exercise: Exercise is an important part of staying healthy during your pregnancy.  You may continue most exercises you were accustomed to prior to pregnancy.  Later in your pregnancy you will most likely notice you have difficulty with activities requiring balance like riding a bicycle.  It is important that you listen to your body and avoid activities that put you at a higher risk of falling.  Adequate rest and staying well hydrated are a must!  If you have questions about the safety of specific activities ask your provider.    Exposure to Children with illness: Try to avoid obvious exposure; report any symptoms to [place] when noted,  If you have chicken pos, red measles or mumps, you should be immune to these diseases.   Please do not take any vaccines while pregnant unless you have checked with your OB provider.  Fetal Movement: After 28 weeks we recommend you do "kick counts" twice daily.  Lie or sit down in a calm quiet environment and count your baby movements "kicks".  You should feel your baby at least 10 times per hour.  If you have not felt 10 kicks within the first hour get up, walk around and have something sweet to eat or drink then repeat for an additional hour.  If count remains less than 10 per hour notify your provider.  Fumigating: Follow your pest control agent's advice as to how long to stay out of your home.  Ventilate the area well before re-entering.  Hemorrhoids:   Most over-the-counter preparations can be used during pregnancy.  Check your medication to see what is  safe to use.  It is important to use a stool softener or fiber in your diet and to drink lots of liquids.  If hemorrhoids seem to be getting worse please call the office.   Hot Tubs:  Hot tubs Jacuzzis and saunas are not recommended while pregnant.  These increase your internal body temperature and should be avoided.  Intercourse:  Sexual intercourse is safe during pregnancy as long as you are comfortable, unless otherwise advised by your provider.  Spotting may occur after intercourse; report any bright red bleeding that is heavier than spotting.  Labor:  If you know that you are in labor, please go to the hospital.  If you are unsure, please call the office and let us help you decide what to do.  Lifting, straining, etc:  If your job requires heavy lifting or straining please check with your provider for any limitations.  Generally, you should not lift items heavier than that you can lift simply with your hands and arms (no back muscles)  Painting:  Paint fumes do not harm your pregnancy, but may make you ill and should be avoided if possible.  Latex or water based paints have less odor than oils.  Use adequate ventilation while painting.  Permanents & Hair Color:  Chemicals in hair dyes are not recommended as they cause increase hair dryness which can increase hair loss during pregnancy.  " Highlighting" and permanents are allowed.  Dye may be absorbed differently and permanents may not hold as well during pregnancy.  Sunbathing:  Use a sunscreen, as skin burns easily during pregnancy.  Drink plenty of fluids; avoid over heating.  Tanning Beds:  Because their possible side effects are still unknown, tanning beds are not recommended.  Ultrasound Scans:  Routine ultrasounds are performed at approximately 20 weeks.  You will be able to see your baby's general anatomy an if you would like to know the gender this can usually be determined as well.  If it is questionable when you conceived you may  also  receive an ultrasound early in your pregnancy for dating purposes.  Otherwise ultrasound exams are not routinely performed unless there is a medical necessity.  Although you can request a scan we ask that you pay for it when conducted because insurance does not cover " patient request" scans.  Work: If your pregnancy proceeds without complications you may work until your due date, unless your physician or employer advises otherwise.  Round Ligament Pain/Pelvic Discomfort:  Sharp, shooting pains not associated with bleeding are fairly common, usually occurring in the second trimester of pregnancy.  They tend to be worse when standing up or when you remain standing for long periods of time.  These are the result of pressure of certain pelvic ligaments called "round ligaments".  Rest, Tylenol and heat seem to be the most effective relief.  As the womb and fetus grow, they rise out of the pelvis and the discomfort improves.  Please notify the office if your pain seems different than that described.  It may represent a more serious condition.

## 2024-02-12 ENCOUNTER — Ambulatory Visit

## 2024-02-12 DIAGNOSIS — Z3687 Encounter for antenatal screening for uncertain dates: Secondary | ICD-10-CM | POA: Diagnosis not present

## 2024-02-12 DIAGNOSIS — Z3A01 Less than 8 weeks gestation of pregnancy: Secondary | ICD-10-CM | POA: Diagnosis not present

## 2024-02-12 DIAGNOSIS — Z3480 Encounter for supervision of other normal pregnancy, unspecified trimester: Secondary | ICD-10-CM

## 2024-02-12 NOTE — Addendum Note (Signed)
 Addended by: DELANA SAUER on: 02/12/2024 12:00 PM   Modules accepted: Orders

## 2024-02-19 ENCOUNTER — Encounter: Payer: Self-pay | Admitting: Obstetrics

## 2024-02-19 ENCOUNTER — Other Ambulatory Visit: Payer: Self-pay | Admitting: Obstetrics

## 2024-02-19 DIAGNOSIS — O3680X0 Pregnancy with inconclusive fetal viability, not applicable or unspecified: Secondary | ICD-10-CM

## 2024-02-19 NOTE — Progress Notes (Signed)
 EDC changed to 10/03/24 per US . Viability scan with MFM ordered.  Rebecca Walton, CNM

## 2024-02-21 ENCOUNTER — Telehealth: Payer: Self-pay | Admitting: Obstetrics

## 2024-02-21 NOTE — Telephone Encounter (Signed)
 The patient is estimating 7 weeks and 4 days. We are needing to reschedule for 12 weeks for her NOB phy. I contacted the patient via phone x2. No answer, call couldn't not be completed at this dialed

## 2024-02-22 ENCOUNTER — Other Ambulatory Visit: Payer: Self-pay

## 2024-02-22 DIAGNOSIS — O3680X0 Pregnancy with inconclusive fetal viability, not applicable or unspecified: Secondary | ICD-10-CM

## 2024-02-22 NOTE — Progress Notes (Signed)
 Patient referred to MFM for dating ultrasound due to body habitus.  MFM replied they do not perform dating ultrasounds under 12 weeks and it will need to be done through centralized scheduling.  Order placed and patient given phone number to schedule.

## 2024-02-26 ENCOUNTER — Encounter: Admitting: Obstetrics

## 2024-03-05 ENCOUNTER — Ambulatory Visit
Admission: RE | Admit: 2024-03-05 | Discharge: 2024-03-05 | Disposition: A | Discharge status: Home / Self Care | Source: Ambulatory Visit | Attending: Obstetrics | Admitting: Obstetrics

## 2024-03-05 DIAGNOSIS — O3680X0 Pregnancy with inconclusive fetal viability, not applicable or unspecified: Secondary | ICD-10-CM | POA: Diagnosis present

## 2024-03-19 ENCOUNTER — Ambulatory Visit: Payer: Self-pay | Admitting: Obstetrics

## 2024-03-25 ENCOUNTER — Ambulatory Visit (INDEPENDENT_AMBULATORY_CARE_PROVIDER_SITE_OTHER): Admitting: Advanced Practice Midwife

## 2024-03-25 ENCOUNTER — Other Ambulatory Visit (HOSPITAL_COMMUNITY)
Admission: RE | Admit: 2024-03-25 | Discharge: 2024-03-25 | Disposition: A | Discharge status: Home / Self Care | Source: Ambulatory Visit | Attending: Advanced Practice Midwife | Admitting: Advanced Practice Midwife

## 2024-03-25 ENCOUNTER — Encounter: Payer: Self-pay | Admitting: Advanced Practice Midwife

## 2024-03-25 VITALS — BP 119/80 | HR 93 | Wt 233.5 lb

## 2024-03-25 DIAGNOSIS — Z1379 Encounter for other screening for genetic and chromosomal anomalies: Secondary | ICD-10-CM

## 2024-03-25 DIAGNOSIS — Z3A12 12 weeks gestation of pregnancy: Secondary | ICD-10-CM | POA: Insufficient documentation

## 2024-03-25 DIAGNOSIS — Z131 Encounter for screening for diabetes mellitus: Secondary | ICD-10-CM

## 2024-03-25 DIAGNOSIS — Z0184 Encounter for antibody response examination: Secondary | ICD-10-CM

## 2024-03-25 DIAGNOSIS — Z3689 Encounter for other specified antenatal screening: Secondary | ICD-10-CM

## 2024-03-25 DIAGNOSIS — O0990 Supervision of high risk pregnancy, unspecified, unspecified trimester: Secondary | ICD-10-CM | POA: Diagnosis present

## 2024-03-25 DIAGNOSIS — Z113 Encounter for screening for infections with a predominantly sexual mode of transmission: Secondary | ICD-10-CM | POA: Diagnosis present

## 2024-03-25 DIAGNOSIS — Z130 Encounter for screening for diseases of the blood and blood-forming organs and certain disorders involving the immune mechanism: Secondary | ICD-10-CM

## 2024-03-25 DIAGNOSIS — Z0283 Encounter for blood-alcohol and blood-drug test: Secondary | ICD-10-CM

## 2024-03-25 DIAGNOSIS — O0991 Supervision of high risk pregnancy, unspecified, first trimester: Secondary | ICD-10-CM

## 2024-03-25 DIAGNOSIS — O99211 Obesity complicating pregnancy, first trimester: Secondary | ICD-10-CM

## 2024-03-25 NOTE — Patient Instructions (Signed)
 Pregnancy: Healthy Eating While you're pregnant, your body needs extra nutrition for your growing baby. You also need more vitamins and minerals, such as folic acid, calcium, iron, and vitamin D. Eating a balanced diet is important for both you and your baby. Your need for extra calories will change during pregnancy. During the first 3 months of pregnancy, called the first trimester, you don't need more calories. During the second trimester, you'll need about 340 extra calories a day. During the third trimester, you'll need about 450 extra calories a day. If you're carrying more than one baby, talk with your health care provider or a dietitian to learn more about your specific eating needs. What are tips for eating healthy during pregnancy? Meal planning  Eating smaller meals throughout the day may help manage some side effects common in pregnancy, like heartburn and reflux. Eat a variety of foods. Be sure to include many types of fruits and vegetables. Two or more servings of fish are recommended each week. Choose fish that are lower in mercury, such as salmon and pollock. Limit foods that have "empty calories." These are foods that have little nutritional value, such as sweets, desserts, candies, and drinks with sugar in them. Drinks that have caffeine are OK to drink, but it's better to avoid caffeine. Limit your total caffeine intake to less than 200 mg each day, or the limit you're told by your provider. Be aware that 200 mg of caffeine is 12 oz or 355 mL of coffee, tea, or soda. General information Take a prenatal vitamin to help meet your vitamin and mineral needs during pregnancy. This includes your need for folic acid, iron, calcium, and vitamin D. Do not try to lose weight or go on a diet during pregnancy. Food safety  Wash your hands before you eat and after you prepare raw meat. Wash all fruits and vegetables well before peeling or eating. Make sure that all meats, poultry, and  eggs are cooked to food-safe temperatures or "well-done." Taking these actions can help keep your food safe and protect you and your baby from dangerous food illnesses. Ask your provider for more information. What foods should I eat? Fruits All fruits. Eat a variety of colors and types of fruit. Remember to wash your fruits well before peeling or eating. Vegetables All vegetables. Eat a variety of colors and types of vegetables. Remember to wash your vegetables well before peeling or eating. Grains All grains. Choose whole grains, such as whole-wheat bread, oatmeal, or brown rice. Meats and other protein foods Lean meats, including chicken, [place], and lean cuts of beef, veal, or pork. Fish that is higher in omega-3 fatty acids and lower in mercury, such as salmon, herring, mussels, trout, sardines, pollock, shrimp, crab, and lobster. Tofu. Tempeh. Beans. Eggs. Peanut butter and other nut butters. Dairy Pasteurized milk and milk alternatives, such as soy milk. Pasteurized yogurt and pasteurized cheese. Cottage cheese. Sour cream. Beverages Water. Juices that contain 100% fruit juice or vegetable juice. Caffeine-free teas and decaffeinated coffee. Fats and oils Fats and oils are OK to include in moderation. Sweets and desserts Sweets and desserts are OK to include in moderation. Seasoning and other foods All pasteurized condiments. The items listed above may not be all the foods and drinks you can have. Talk with a dietitian to learn more. What does 340 extra calories look like? Healthy snacks that give you 340 more calories a day could be: Peanut butter and jelly with milk: 8 oz (237 mL) of  low-fat milk. Peanut butter and jelly sandwich made with: 1 slice of whole-wheat bread. 2 teaspoons (10 g) of peanut butter. Yogurt and berries: 1 cup (245 g) of [place] yogurt. 1 cup (150 g) of berries. 2 tablespoons (30 g) of chopped nuts, such as almonds or walnuts. Avocado toast: 1 slice of  whole-wheat bread. 1/2 medium avocado (70 g). 1 large egg (50 g). What foods should I avoid? Fruits Raw (unpasteurized) fruit juices. Vegetables Unpasteurized vegetable juices. Meats and other protein foods Precooked or cured meat, such as bologna, hot dogs, sausages, or meat loaves. (If you must eat those meats, reheat them until they are steaming hot.) Refrigerated pate, meat spreads from a meat counter, or smoked seafood that's found in the refrigerated section of a store. Raw or undercooked meats, poultry, and eggs. Raw fish, such as sushi or sashimi. Fish that have high mercury content, such as tilefish, shark, swordfish, and king mackerel. Dairy Unpasteurized or raw milk and any foods that are made from them. Some of these may be: Homemade yogurts or puddings. Soft cheeses such as: Feta. Queso blanco or fresco. Pharmacist, hospital or [place]. Blue-veined cheeses. Some of these types of cheeses may be made with pasteurized milk. Check the label. If pasteurized milk is used, they are OK to eat during pregnancy. Deli foods Premade foods from a store or deli, like chicken salad, coleslaw, or egg salad. These are riskier for food illness than fresh or homemade salads. Beverages Alcohol. Sugar-sweetened drinks, such as sodas or teas. Energy drinks. Seasoning and other foods Homemade fermented foods and drinks, such as: Pickles. Sauerkraut. Kombucha. Store-bought pasteurized versions of these are OK. The items listed above may not be all the foods and drinks you should avoid. Talk with a dietitian to learn more. Where to find more information To learn more, go to: Centers for Disease Control and Prevention at TonerPromos.no. Click "Search" and type "food choices for pregnancy." Find the link you need. MyPlate at http://pittman-dennis.biz/. This information is not intended to replace advice given to you by your health care provider. Make sure you discuss any questions you have with your health care  provider. Document Revised: 06/21/2023 Document Reviewed: 06/21/2023 Elsevier Patient Education  2025 Elsevier Inc.Exercise During Pregnancy Exercise is an important part of being healthy for people of all ages. Exercise helps your heart and lungs work well. Exercise also: Helps you stay strong and flexible. Helps you keep a healthy body weight. Boosts your energy levels and improves your mood. You should try to exercise regularly during pregnancy. Exercise routines may need to change later in your pregnancy. In rare cases, certain medical problems in your pregnancy may limit the exercise you can do during pregnancy. Your health care provider will give you information on what exercises will work for you. How does exercise help during pregnancy? Along with staying strong and flexible, exercising during pregnancy can help: Keep strength in muscles that are used during labor and birth. Control weight gain. Speed up your recovery after giving birth. Reduce the need for insulin if you get diabetes during pregnancy. Decrease low back pain. Lower the risk for depression. Lower the risk of cesarean delivery. Treat trouble pooping (constipation). How does exercise affect my baby? Exercise can help you have a healthy pregnancy. Exercise does not cause your baby to be born early. It will not cause your baby to weigh less at birth. What exercises can I do? Many exercises are safe for you to do during pregnancy. Do a variety of  exercises that safely increase your heart and breathing rates and help you build and maintain muscle strength. Do exercises as told by your provider. Your provider may recommend: Walking. Swimming. Water aerobics. Riding a stationary bike. Modified yoga or Pilates. Tell your instructor that you're pregnant. Avoid overstretching. Avoid lying on your back for long periods of time. Resistance exercises with weights or elastic bands. Running or jogging. Choose this type of  exercise only if: You ran or jogged regularly before your pregnancy. You can run or jog and still talk in full sentences. What exercises should I avoid? You may be told to limit high-intensity exercise depending on your level of fitness and if you exercised regularly before you became pregnant. You can tell that you're exercising at a high intensity if you're breathing much harder and faster and can't hold a conversation while exercising. You may be told to: Avoid jogging or running, unless you jogged or ran regularly before you became pregnant. Do not run or jog so fast that you're unable to have a conversation. Avoid activities that put you at risk for falling on your belly or getting hit in the belly. Some of these are: Downhill skiing. Rock climbing. Cycling and gymnastics. Horseback riding. Surfing and waterskiing. Contact sports. Avoid scuba diving. Avoid skydiving. Avoid activities that take place in a room that's heated to high temperatures, such as hot yoga or hot Pilates. How do I exercise in a safe way?  Start slowly. Ask your provider to recommend the types of exercise that are safe for you. Avoid overheating. Do not exercise in very high temperatures or hot rooms. Avoid hot yoga or hot Pilates. Avoid standing still or lying flat on your back as much as you can. Avoid losing too much fluid (dehydration). Drink more fluids as told. Drink before, during, and after you exercise. Avoid overstretching. Because of hormone changes during pregnancy, it's easy to overstretch muscles, tendons, and ligaments. Ligaments are the tissues that connect bones to each other. Do not exercise to lose weight. Do not exercise at more than 6,000 feet above sea level (high elevation) if you don't live at that elevation. Tips and recommendations Wear loose-fitting, breathable clothes. Wear a sports bra to support your breasts. Exercise on most days or all days of the week. Try to exercise for 30  minutes a day, 5 days a week. If problems come up during your pregnancy, you provider may tell you to limit some exercises or to exercise less. If you have concerns, ask your provider. If you actively exercised before your pregnancy, your provider may tell you to continue to do moderate-intensity to high-intensity exercise. If you're just starting to exercise or didn't exercise much before your pregnancy, your provider may tell you to do low-intensity to moderate-intensity exercise. Questions to ask your health care provider Is exercise safe for me? What are signs that I should stop exercising? Does my health condition mean that I should not exercise during pregnancy? When should I avoid exercising during pregnancy? Stop exercising and contact a health care provider if: You have any unusual symptoms, such as: Mild contractions or cramps in the belly. Dizziness that does not go away when you rest. Headache. Pain and swelling of your calves. Bleeding or fluid leaking from your vagina. Stop exercising and get help right away if: You have: Chest pain. Shortness of breath. Sudden, severe pain in your low back or your belly. Regular, painful contractions before 37 weeks of pregnancy. These symptoms may be an  emergency. Call 911 right away. Do not wait to see if the symptoms will go away. Do not drive yourself to the hospital. This information is not intended to replace advice given to you by your health care provider. Make sure you discuss any questions you have with your health care provider. Document Revised: 02/27/2023 Document Reviewed: 02/27/2023 Elsevier Patient Education  2024 [employer].

## 2024-03-25 NOTE — Progress Notes (Signed)
 OBSTETRIC INITIAL PRENATAL VISIT   Chief Complaint: Desires prenatal care   History of Present Illness: Patient is a 33 y.o. [id number] Not Hispanic or Latino female, presents with amenorrhea and positive home pregnancy test. Patient's last menstrual period was 12/05/2023. and based on 6 week u/s, her EDD is Estimated Date of Delivery: 10/03/24 and her EGA is [redacted]w[redacted]d. Cycles are 5-7 days, regular, and occur approximately every : 28 days. She had her last delivery 6 months ago. Reports missing some of her birth control pills. Her last pap smear was 3 years ago and was no abnormalities.    She had a urine pregnancy test which was positive 2 month(s)  ago. Her last menstrual period was normal and lasted for  5 or 6 day(s). Since her LMP she claims she has experienced fatigue, nausea. She had light pink vaginal bleeding last week. Her past medical history is noncontributory. Her prior pregnancies are notable for c/section x 2. Had a hemorrhage with transfusion after first delivery which affected her ability to breastfeed. She prefers repeat c/section with BTL.  Since her LMP, she admits to the use of tobacco products  no She claims she has gained 3 pounds since the start of her pregnancy.  There are cats in the home in the home  no She admits close contact with children on a regular basis  yes  She has had chicken pox in the past no She has had Tuberculosis exposures, symptoms, or previously tested positive for TB   no Current or past history of domestic violence. no  Genetic Screening/Teratology Counseling: (Includes patient, baby's father, or anyone in either family with:)   1. Patient's age >/= 34 at [facility]  no 2. Thalassemia ([place], [place], Mediterranean, or Asian background): MCV<80  no 3. Neural tube defect (meningomyelocele, spina bifida, anencephaly)  no 4. Congenital heart defect  no  5. Down syndrome  no 6. Tay-Sachs (Jewish, [place]  no 7. Canavan's Disease  no 8. Sickle cell  disease or trait (African)  no  9. Hemophilia or other blood disorders  no  10. Muscular dystrophy  no  11. Cystic fibrosis  no  12. Huntington's Chorea  no  13. Mental retardation/autism  no 14. Other inherited genetic or chromosomal disorder  no 15. Maternal metabolic disorder (DM, PKU, etc)  no 16. Patient or FOB with a child with a birth defect not listed above no  16a. Patient or FOB with a birth defect themselves no 17. Recurrent pregnancy loss, or stillbirth  no  18. Any medications since LMP other than prenatal vitamins (include vitamins, supplements, OTC meds, drugs, alcohol)  no 19. Any other genetic/environmental exposure to discuss  no  Infection History:   1. Lives with someone with TB or TB exposed  no  2. Patient or partner has history of genital herpes  no 3. Rash or viral illness since LMP  no 4. History of STI (GC, CT, HPV, syphilis, HIV)  no 5. 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    Syphilis testing done on 08/28/23- [facility] state lab ---> may take 2-3 weeks for return -treated with 1 dose of bicillin  2.4 million Units on 08/28/23    Obesity affecting pregnancy  04/24/2023   H/O cesarean section 03/14/2023   H/O postpartum hemorrhage, currently pregnant 03/14/2023   BMI 45.0-49.9, adult (HCC) 03/14/2023    -Growth scan at 28-32 weeks -Weekly NSTs at 34w [facility] with NOB labs    Supervision of high risk pregnancy, antepartum 02/08/2023     Clinical Staff Provider  Office Location  [facility] Dating  10/03/2024, by Ultrasound  Language  English Anatomy US     Flu Vaccine   Genetic Screen  NIPS:   TDaP vaccine    Hgb A1C or  GTT Early : Third trimester :   Covid    LAB RESULTS   Rhogam  --/--/A POS  (02/28 9070)  Blood Type --/--/A POS (02/28 0929)   RSV  Antibody NEG (02/28 0929)  Feeding Plan  Rubella    Contraception  RPR NON REACTIVE (02/28 0929)   Circumcision  HBsAg     Pediatrician   HIV Non Reactive (12/18 0942)  Support Person  Varicella    Prenatal Classes  GBS Positive/-- (02/11 1625)(For PCN allergy, check sensitivities)     Hep C     BTL Consent Consent [ ]  Pap Diagnosis  Date Value Ref Range Status  11/30/2020   Final   - Negative for intraepithelial lesion or malignancy (NILM)    VBAC Consent Plans repeat Hgb Electro      CF      SMA             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   Asthma 03/27/2018    Past Surgical History:  Past Surgical History:  Procedure Laterality Date   CESAREAN SECTION N/A 06/29/2017   Procedure: CESAREAN SECTION;  Surgeon: Leonce Garnette BIRCH, MD;  Location: [facility] ORS;  Service: Obstetrics;  Laterality: N/A;   CESAREAN SECTION N/A 09/18/2023   Procedure: REPEAT CESAREAN DELIVERY;  Surgeon: Janit Alm Agent, MD;  Location: [facility] ORS;  Service: Obstetrics;  Laterality: N/A;   CHOLECYSTECTOMY N/A 08/22/2017   Procedure: LAPAROSCOPIC CHOLECYSTECTOMY;  Surgeon: Nicholaus Selinda Birmingham, MD;  Location: [facility] ORS;  Service: General;  Laterality: N/A;    Gynecologic History: Patient's last menstrual period was 12/05/2023.  Obstetric History: [id number]  Family History:  Family History  Problem Relation Age of Onset   Anemia Mother    Healthy Father    Asthma Sister    Anemia Sister    Healthy Sister    Healthy Brother    Healthy Maternal Grandmother    Diabetes Maternal Grandfather    Diabetes Paternal Grandmother    Healthy Paternal Grandfather    Epilepsy Maternal Aunt    Cerebral palsy Maternal Aunt     Social History:  Social History   Socioeconomic History   Marital status: Married    Spouse name: Beverley   Number of children: 2   Years of education: 11   Highest education level: 10th grade  Occupational History    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  Tobacco Use   Smoking status: Never   Smokeless tobacco: Never  Vaping Use   Vaping status: Never Used  Substance and Sexual Activity   Alcohol use: Not Currently    Comment: last use 01/12/2024 beer   Drug use: Never   Sexual activity: Yes    Partners: Male    Birth control/protection: None    Comment: last condom use - 01/18/2024 hx ocp 5 yrs ago  Other Topics Concern   Not on file  Social History Narrative   ** Merged History  Encounter **       Social Drivers of Health   Financial Resource Strain: Low Risk  (01/26/2024)   Overall Financial Resource Strain (CARDIA)    Difficulty of Paying Living Expenses: Not hard at all  Food Insecurity: No Food Insecurity (01/26/2024)   Hunger Vital Sign    Worried About Running Out of Food in the Last Year: Never true    Ran Out of Food in the Last Year: Never true  Transportation Needs: No Transportation Needs (01/26/2024)   PRAPARE - Administrator, Civil Service (Medical): No    Lack of Transportation (Non-Medical): No  Physical Activity: Insufficiently Active (01/26/2024)   Exercise Vital Sign    Days of Exercise per Week: 1 day    Minutes of Exercise per Session: 20 min  Stress: No Stress Concern Present (01/26/2024)   [employer] of Occupational Health - Occupational Stress Questionnaire    Feeling of Stress: Not at all  Social Connections: Moderately Integrated (01/26/2024)   Social Connection and Isolation Panel    Frequency of Communication with Friends and Family: Three times a week    Frequency of Social Gatherings with Friends and Family: Three times a week    Attends Religious Services: More than 4 times per year    Active Member of Clubs or Organizations: No    Attends Banker Meetings: Not on file    Marital Status: Married  Intimate Partner Violence: Not At Risk (01/25/2024)   Humiliation, Afraid, Rape, and Kick questionnaire    Fear of Current or  Ex-Partner: No    Emotionally Abused: No    Physically Abused: No    Sexually Abused: No    Allergies:  Allergies  Allergen Reactions   Coconut Flavoring Agent (Non-Screening) Swelling    Throat swells and closes.    Medications: Prior to Admission medications   Medication Sig Start Date End Date Taking? Authorizing Provider  Ferrous Sulfate  (IRON ) 325 (65 Fe) MG TABS Take by mouth daily.   Yes [provider]  Prenatal Vit-Fe Fumarate-FA (MULTIVITAMIN-PRENATAL) 27-0.8 MG TABS tablet Take 1 tablet by mouth daily at 12 noon. 01/25/24 05/04/24 Yes Macario Dorothyann HERO, MD    Physical Exam Vitals: Blood pressure 119/80, pulse 93, weight 233 lb 8 oz (105.9 kg), last menstrual period 12/05/2023  General: NAD HEENT: normocephalic, anicteric Thyroid: no enlargement, no palpable nodules Pulmonary: No increased work of breathing, CTAB Cardiovascular: RRR, distal pulses 2+ Abdomen: NABS, soft, non-tender, non-distended.  Umbilicus without lesions.  No evidence of hernia. FHTs by doppler 150s Genitourinary: deferred for PAP interval- 5 year/self swab aptima Extremities: no edema, erythema, or tenderness Neurologic: Grossly intact Psychiatric: mood appropriate, affect full   The following were addressed during this visit:  Breastfeeding Education - Early initiation of breastfeeding    Comments: Keeps milk supply adequate, helps contract uterus and slow bleeding, and early milk is the perfect first food and is easy to digest.   - The importance of exclusive breastfeeding    Comments: Provides antibodies, Lower risk of breast and ovarian cancers, and type-2 diabetes,Helps your body recover, Reduced chance of SIDS.   - Risks of giving your baby anything other than breast milk if you are breastfeeding    Comments: Make the baby less content with breastfeeds, may make my baby more susceptible to illness, and may reduce my milk supply.   - The importance of early skin-to-skin  contact    Comments:  Keeps baby warm and  secure, helps keep baby's blood sugar up and breathing steady, easier to bond and breastfeed, and helps calm baby.  - Rooming-in on a 24-hour basis    Comments: Easier to learn baby's feeding cues, easier to bond and get to know each other, and encourages milk production.   - Feeding on demand or baby-led feeding    Comments: Helps prevent breastfeeding complications, helps bring in good milk supply, prevents under or overfeeding, and helps baby feel content and satisfied   - Frequent feeding to help assure optimal milk production    Comments: Making a full supply of milk requires frequent removal of milk from breasts, infant will eat 8-12 times in 24 hours, if separated from infant use breast massage, hand expression and/ or pumping to remove milk from breasts.   - Effective positioning and attachment    Comments: Helps my baby to get enough breast milk, helps to produce an adequate milk supply, and helps prevent nipple pain and damage   - Exclusive breastfeeding for the first 6 months    Comments: Builds a healthy milk supply and keeps it up, protects baby from sickness and disease, and breastmilk has everything your baby needs for the first 6 months.    Assessment: 33 y.o. [id number] at [redacted]w[redacted]d presenting to initiate prenatal care  Plan: 1) Avoid alcoholic beverages. 2) Patient encouraged not to smoke.  3) Discontinue the use of all non-medicinal drugs and chemicals.  4) Take prenatal vitamins daily.  5) Nutrition, food safety (fish, cheese advisories, and high nitrite foods) and exercise discussed. 6) Hospital and practice style discussed with cross coverage system.  7) Genetic Screening, such as with 1st Trimester Screening, cell free fetal DNA, AFP testing, and Ultrasound, as well as with amniocentesis and CVS as appropriate, is discussed with patient. At the conclusion of today's visit patient requested genetic testing 8) Patient is asked  about travel to areas at risk for the Zika virus, and counseled to avoid travel and exposure to mosquitoes or sexual partners who may have themselves been exposed to the virus. Testing is discussed, and will be ordered as appropriate.  9) NOB labs today 10) Return to clinic in 4 weeks for ROB and in 8 weeks for anatomy us /ROB   Slater Rains, CNM [facility] 03/25/2024 5:37 PM

## 2024-03-26 LAB — CBC/D/PLT+RPR+RH+ABO+RUBIGG...
Antibody Screen: NEGATIVE
Basophils Absolute: 0 x10E3/uL (ref 0.0–0.2)
Basos: 0 %
EOS (ABSOLUTE): 0.2 x10E3/uL (ref 0.0–0.4)
Eos: 2 %
HCV Ab: NONREACTIVE
HIV Screen 4th Generation wRfx: NONREACTIVE
Hematocrit: 38 % (ref 34.0–46.6)
Hemoglobin: 12 g/dL (ref 11.1–15.9)
Hepatitis B Surface Ag: NEGATIVE
Immature Grans (Abs): 0 x10E3/uL (ref 0.0–0.1)
Immature Granulocytes: 0 %
Lymphocytes Absolute: 2.5 x10E3/uL (ref 0.7–3.1)
Lymphs: 29 %
MCH: 25.6 pg — ABNORMAL LOW (ref 26.6–33.0)
MCHC: 31.6 g/dL (ref 31.5–35.7)
MCV: 81 fL (ref 79–97)
Monocytes Absolute: 0.5 x10E3/uL (ref 0.1–0.9)
Monocytes: 6 %
Neutrophils Absolute: 5.5 x10E3/uL (ref 1.4–7.0)
Neutrophils: 63 %
Platelets: 248 x10E3/uL (ref 150–450)
RBC: 4.68 x10E6/uL (ref 3.77–5.28)
RDW: 14.6 % (ref 11.7–15.4)
RPR Ser Ql: NONREACTIVE
Rh Factor: POSITIVE
Rubella Antibodies, IGG: <0.9 {index} — ABNORMAL LOW (ref >0.99)
Varicella zoster IgG: REACTIVE
WBC: 8.8 x10E3/uL (ref 3.4–10.8)

## 2024-03-26 LAB — MICROSCOPIC EXAMINATION
Bacteria, UA: NONE SEEN
Casts: NONE SEEN /LPF

## 2024-03-26 LAB — URINALYSIS, ROUTINE W REFLEX MICROSCOPIC
Bilirubin, UA: NEGATIVE
Glucose, UA: NEGATIVE
Ketones, UA: NEGATIVE
Nitrite, UA: NEGATIVE
RBC, UA: NEGATIVE
Specific Gravity, UA: 1.023 (ref 1.005–1.030)
Urobilinogen, Ur: 1 mg/dL (ref 0.2–1.0)
pH, UA: 7 (ref 5.0–7.5)

## 2024-03-26 LAB — HCV INTERPRETATION

## 2024-03-26 LAB — HGB A1C W/O EAG: Hgb A1c MFr Bld: 5.5 % (ref 4.8–5.6)

## 2024-03-27 LAB — MONITOR DRUG PROFILE 14(MW)
Amphetamine Scrn, Ur: NEGATIVE ng/mL
BARBITURATE SCREEN URINE: NEGATIVE ng/mL
BENZODIAZEPINE SCREEN, URINE: NEGATIVE ng/mL
Buprenorphine, Urine: NEGATIVE ng/mL
CANNABINOIDS UR QL SCN: NEGATIVE ng/mL
Cocaine (Metab) Scrn, Ur: NEGATIVE ng/mL
Creatinine(Crt), U: 141 mg/dL (ref 20.0–300.0)
Fentanyl, Urine: NEGATIVE pg/mL
Meperidine Screen, Urine: NEGATIVE ng/mL
Methadone Screen, Urine: NEGATIVE ng/mL
OXYCODONE+OXYMORPHONE UR QL SCN: NEGATIVE ng/mL
Opiate Scrn, Ur: NEGATIVE ng/mL
Ph of Urine: 6.9 (ref 4.5–8.9)
Phencyclidine Qn, Ur: NEGATIVE ng/mL
Propoxyphene Scrn, Ur: NEGATIVE ng/mL
SPECIFIC GRAVITY: 1.019
Tramadol Screen, Urine: NEGATIVE ng/mL

## 2024-03-27 LAB — CERVICOVAGINAL ANCILLARY ONLY
Chlamydia: NEGATIVE
Comment: NEGATIVE
Comment: NEGATIVE
Comment: NORMAL
Neisseria Gonorrhea: NEGATIVE
Trichomonas: NEGATIVE

## 2024-03-27 LAB — NICOTINE SCREEN, URINE: Cotinine Ql Scrn, Ur: NEGATIVE ng/mL

## 2024-03-27 LAB — CULTURE, OB URINE

## 2024-03-27 LAB — URINE CULTURE, OB REFLEX

## 2024-03-30 LAB — MATERNIT 21 PLUS CORE, BLOOD
Fetal Fraction: 9
Result (T21): NEGATIVE
Trisomy 13 (Patau syndrome): NEGATIVE
Trisomy 18 (Edwards syndrome): NEGATIVE
Trisomy 21 (Down syndrome): NEGATIVE

## 2024-03-31 ENCOUNTER — Ambulatory Visit: Payer: Self-pay | Admitting: Advanced Practice Midwife

## 2024-04-23 ENCOUNTER — Encounter: Admitting: Certified Nurse Midwife

## 2024-04-23 ENCOUNTER — Other Ambulatory Visit

## 2024-04-23 NOTE — Progress Notes (Unsigned)
    Return Prenatal Note   Assessment/Plan   Plan  33 y.o. H5E7987 at [redacted]w[redacted]d presents for follow-up OB visit. Reviewed prenatal record including previous visit note.  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 Will schedule visit with MD in 3rd trimester    Orders Placed This Encounter  Procedures   AFP, Serum, Open Spina Bifida    Is patient insulin dependent?:   No    Gestational Age (GA), weeks:   18    Date on which patient was at this GA:   04/18/2024    GA Calculation Method:   Ultrasound    Number of fetuses:   1    Donor egg?:   N   No follow-ups on file.   Future Appointments  Date Time Provider Department Center  05/21/2024  8:15 AM [facility] US  1 [facility]G None    For next visit:  Routine prenatal care    Subjective  Rebecca Walton is feeling well today. She has no concerns.  Movement: Present  Objective   Flow sheet Vitals: Pulse Rate: 92 BP: 122/82 Fetal Heart Rate (bpm): 147 Total weight gain: 5 lb (2.268 kg)  General Appearance  No acute distress, well appearing, and well nourished Pulmonary   Normal work of breathing Neurologic   Alert and oriented to person, place, and time Psychiatric   Mood and affect within normal limits  Eleanor Canny, CNM 04/24/24 1:46 PM

## 2024-04-24 ENCOUNTER — Ambulatory Visit (INDEPENDENT_AMBULATORY_CARE_PROVIDER_SITE_OTHER): Admitting: Obstetrics

## 2024-04-24 VITALS — BP 122/82 | HR 92 | Wt 235.0 lb

## 2024-04-24 DIAGNOSIS — Z1379 Encounter for other screening for genetic and chromosomal anomalies: Secondary | ICD-10-CM

## 2024-04-24 DIAGNOSIS — O0992 Supervision of high risk pregnancy, unspecified, second trimester: Secondary | ICD-10-CM

## 2024-04-24 DIAGNOSIS — O34219 Maternal care for unspecified type scar from previous cesarean delivery: Secondary | ICD-10-CM | POA: Diagnosis not present

## 2024-04-24 DIAGNOSIS — Z3A16 16 weeks gestation of pregnancy: Secondary | ICD-10-CM | POA: Diagnosis not present

## 2024-04-24 DIAGNOSIS — O0990 Supervision of high risk pregnancy, unspecified, unspecified trimester: Secondary | ICD-10-CM

## 2024-04-24 DIAGNOSIS — Z98891 History of uterine scar from previous surgery: Secondary | ICD-10-CM

## 2024-04-24 NOTE — Assessment & Plan Note (Addendum)
-  Anatomy scan 05/21/24 -AFP today -Reviewed danger signs and when to the hospital -Anticipatory about 2nd trimester -Work note given to limit working in the [employer]

## 2024-04-24 NOTE — Assessment & Plan Note (Signed)
-  Desires RCS with BTL. Will schedule visit with MD in 3rd trimester

## 2024-04-26 LAB — AFP, SERUM, OPEN SPINA BIFIDA
AFP MoM: 0.58
AFP Value: 16.3 ng/mL
Gest. Age on Collection Date: 16.9 wk
Maternal Age At EDD: 32.7 a
OSBR Risk 1 IN: 10000
Test Results:: NEGATIVE
Weight: 235 [lb_av]

## 2024-04-28 ENCOUNTER — Ambulatory Visit: Payer: Self-pay | Admitting: Obstetrics

## 2024-05-20 NOTE — Progress Notes (Signed)
    Return Prenatal Note   Subjective   33 y.o. H5E7987 at [redacted]w[redacted]d presents for this follow-up prenatal visit.  Patient  Patient reports:Doing well. No concerns today,  -Traveling to [place] to see the [place] with her friend  Movement: Present Contractions: Not present  Objective   Flow sheet Vitals: Pulse Rate: 80 BP: 132/84 Fetal Heart Rate (bpm): 140 Total weight gain: 9 lb 12.8 oz (4.445 kg)  General Appearance  No acute distress, well appearing, and well nourished Pulmonary   Normal work of breathing Neurologic   Alert and oriented to person, place, and time Psychiatric   Mood and affect within normal limits   Assessment/Plan   Plan  33 y.o. H5E7987 at [redacted]w[redacted]d presents for follow-up OB visit. Reviewed prenatal record including previous visit note.  Supervision of high risk pregnancy, antepartum -TWG 9lbs, WNL, pt has been exercising regularly and watching what she eats -US  today, preliminary report shows normal anatomy, female  -warning signs reviewed       No orders of the defined types were placed in this encounter.  No follow-ups on file.   Future Appointments  Date Time Provider Department Center  06/18/2024 10:55 AM Sebastian Sham, CNM [facility] None    For next visit:  continue with routine prenatal care     Mattisyn Cardona [facility], CNM  05/21/2509:40 AM

## 2024-05-21 ENCOUNTER — Ambulatory Visit (INDEPENDENT_AMBULATORY_CARE_PROVIDER_SITE_OTHER): Admitting: Licensed Practical Nurse

## 2024-05-21 ENCOUNTER — Ambulatory Visit

## 2024-05-21 ENCOUNTER — Other Ambulatory Visit

## 2024-05-21 VITALS — BP 132/84 | HR 80 | Wt 239.8 lb

## 2024-05-21 DIAGNOSIS — Z3689 Encounter for other specified antenatal screening: Secondary | ICD-10-CM | POA: Diagnosis not present

## 2024-05-21 DIAGNOSIS — O0990 Supervision of high risk pregnancy, unspecified, unspecified trimester: Secondary | ICD-10-CM

## 2024-05-21 DIAGNOSIS — Z3A21 21 weeks gestation of pregnancy: Secondary | ICD-10-CM

## 2024-05-21 DIAGNOSIS — Z3A20 20 weeks gestation of pregnancy: Secondary | ICD-10-CM | POA: Diagnosis not present

## 2024-05-21 NOTE — Assessment & Plan Note (Signed)
-  TWG 9lbs, WNL, pt has been exercising regularly and watching what she eats -US  today, preliminary report shows normal anatomy, female  -warning signs reviewed

## 2024-06-18 ENCOUNTER — Encounter: Admitting: Certified Nurse Midwife

## 2024-06-18 NOTE — Patient Instructions (Incomplete)
 Third Trimester of Pregnancy  The third trimester of pregnancy is from week 28 through week 40. This is months 7 through 9. The third trimester is a time when your baby is growing fast. Body changes during your third trimester Your body continues to change during this time. The changes usually go away after your baby is born. Physical changes You will continue to gain weight. You may get stretch marks on your hips, belly, and breasts. Your breasts will keep growing and may hurt. A yellow fluid (colostrum) may leak from your breasts. This is the first milk you're making for your baby. Your hair may grow faster and get thicker. In some cases, you may get hair loss. Your belly button may stick out. You may have more swelling in your hands, face, or ankles. Health changes You may have heartburn. You may feel short of breath. This is caused by the uterus that is now bigger. You may have more aches in the pelvis, back, or thighs. You may have more tingling or numbness in your hands, arms, and legs. You may pee more often. You may have trouble pooping (constipation) or swollen veins in the butt that can itch or get painful (hemorrhoids). Other changes You may have more problems sleeping. You may notice the baby moving lower in your belly (dropping). You may have more fluid coming from your vagina. Your joints may feel loose, and you may have pain around your pelvic bone. Follow these instructions at home: Medicines Take medicines only as told by your health care provider. Some medicines are not safe during pregnancy. Your provider may change the medicines that you take. Do not take any medicines unless told to by your provider. Take a prenatal vitamin that has at least 600 micrograms (mcg) of folic acid. Do not use herbal medicines, illegal drugs, or medicines that are not approved by your provider. Eating and drinking While you're pregnant your body needs additional nutrition to help  support your growing baby. Talk with your provider about your nutritional needs. Activity Most women are able to exercise regularly during pregnancy. Exercise routines may need to change at the end of your pregnancy. Talk to your provider about your activities and exercise routine. Relieving pain and discomfort Rest often with your legs raised if you have leg cramps or low back pain. Take warm sitz baths to soothe pain from hemorrhoids. Use hemorrhoid cream if your provider says it's okay. Wear a good, supportive bra if your breasts hurt. Do not use hot tubs, steam rooms, or saunas. Do not douche. Do not use tampons or scented pads. Safety Talk to your provider before traveling far distances. Wear your seatbelt at all times when you're in a car. Talk to your provider if someone hits you, hurts you, or yells at you. Preparing for birth To prepare for your baby: Take childbirth and breastfeeding classes. Visit the hospital and tour the maternity area. Buy a rear-facing car seat. Learn how to install it in your car. General instructions Avoid cat litter boxes and soil used by cats. These things carry germs that can cause harm to your pregnancy and your baby. Do not drink alcohol, smoke, vape, or use products with nicotine or tobacco in them. If you need help quitting, talk with your provider. Keep all follow-up visits for your third trimester. Your provider will do more exams and tests during this trimester. Write down your questions. Take them to your prenatal visits. Your provider also will: Talk with you about  your overall health. Give you advice or refer you to specialists who can help with different needs, including: Mental health and counseling. Foods and healthy eating. Ask for help if you need help with food. Where to find more information American Pregnancy Association: americanpregnancy.org [employer]ians [employer] Gynecologists: acog.org Office on [employer] Health:  [employer] Contact a health care provider if: You have a headache that does not go away when you take medicine. You have any of these problems: You can't eat or drink. You have nausea and vomiting. You have watery poop (diarrhea) for 2 days or more. You have pain when you pee, or your pee smells bad. You have been sick for 2 days or more and aren't getting better. Contact your provider right away if: You have any of these coming from your vagina: Abnormal discharge. Bad-smelling fluid. Bleeding. Your baby is moving less than usual. You have signs of labor: You have any contractions, belly cramping, or have pain in your pelvis or lower back before 37 weeks of pregnancy (preterm labor). You have regular contractions that are less than 5 minutes apart. Your water breaks. You have symptoms of high blood pressure or preeclampsia. These include: A severe, throbbing headache that does not go away. Sudden or extreme swelling of your face, hands, legs, or feet. Vision problems: You see spots. You have blurry vision. Your eyes are sensitive to light. If you can't reach your provider, go to an urgent care or emergency room. Get help right away if: You faint, become confused, or can't think clearly. You have chest pain or trouble breathing. You have any kind of injury, such as from a fall or a car crash. These symptoms may be an emergency. Call 911 right away. Do not wait to see if the symptoms will go away. Do not drive yourself to the hospital. This information is not intended to replace advice given to you by your health care provider. Make sure you discuss any questions you have with your health care provider. Document Revised: 04/06/2023 Document Reviewed: 11/04/2022 Elsevier Patient Education  2024 [employer].

## 2024-06-18 NOTE — Progress Notes (Deleted)
    Return Prenatal Note   Subjective   33 y.o. H5E7987 at [redacted]w[redacted]d presents for this follow-up prenatal visit.  Patient *** Patient reports:    Objective   Flow sheet Vitals:   Total weight gain: 9 lb 12.8 oz (4.445 kg)  General Appearance  No acute distress, well appearing, and well nourished Pulmonary   Normal work of breathing Neurologic   Alert and oriented to person, place, and time Psychiatric   Mood and affect within normal limits   Assessment/Plan   Plan  33 y.o. H5E7987 at [redacted]w[redacted]d presents for follow-up OB visit. Reviewed prenatal record including previous visit note.  No problem-specific Assessment & Plan notes found for this encounter.      No orders of the defined types were placed in this encounter.  No follow-ups on file.   Future Appointments  Date Time Provider Department Center  06/18/2024 10:55 AM Sebastian Sham, CNM [facility] None    For next visit:  {jlaprenatalcare:31363}     Rollo JINNY Maxin, CMA  06/18/2509:52 AM

## 2024-06-27 NOTE — Progress Notes (Unsigned)
° ° °  Return Prenatal Note   Subjective   33 y.o. H5E7987 at [redacted]w[redacted]d presents for this follow-up prenatal visit.  Patient has no c/o Patient reports: Movement: Present Contractions: Not present  Objective   Flow sheet Vitals: Pulse Rate: (!) 105 BP: 104/70 Fundal Height: 40 cm Fetal Heart Rate (bpm): 145 Total weight gain: 8.482 kg  General Appearance  No acute distress, well appearing, and well nourished Pulmonary   Normal work of breathing Neurologic   Alert and oriented to person, place, and time Psychiatric   Mood and affect within normal limits   Assessment/Plan   Plan  33 y.o. H5E7987 at [redacted]w[redacted]d presents for follow-up OB visit. Reviewed prenatal record including previous visit note.  Obesity affecting pregnancy BMI 40-59.99  Second Trimester  [x] Targeted US   [x] Begin ASA after 12 weeks   [ ] Anesthesia consult at 45 at NOB or if reaches 50 at any point in pregnancy-> currently 48.5   Third Trimester  [ ] Glucose screening-> next visit [ ] Monthly growth US  starting at 24 weeks then q 4w-> no imaging since anatomy scan. Ordered growth for next visit in 2 weeks. [ ] Weekly antenatal testing 34+0    Delivery timing  [ ] Delivery 39-40.6 weeks  [ ] SCDs with cesarean  [ ] 3-gram Cefazolin  with cesarean delivery    Postpartum  [ ] Consider lovenox x 6 weeks for BMI greater than 50       Orders Placed This Encounter  Procedures   US  OB Comp + 14 Wk    Standing Status:   Future    Expected Date:   07/11/2024    Expiration Date:   06/27/2025    Reason for Exam (SYMPTOM  OR DIAGNOSIS REQUIRED):   Fetal Anatomic Survey    Preferred Imaging Location?:   Internal   28 Week RH+Panel    Standing Status:   Future    Expiration Date:   06/27/2025   No follow-ups on file.   No future appointments.   For next visit:  ROB with 1 hour glucola, third trimester labs, and Tdap, growth scan.     Lauraine Lakes, CNM  06/28/2024 10:34 AM

## 2024-06-27 NOTE — Assessment & Plan Note (Deleted)
 BMI 40-59.99  Second Trimester  [x] Targeted US   [x] Begin ASA after 12 weeks   [ ] Anesthesia consult at 45 at NOB or if reaches 50 at any point in pregnancy-> currently 48.5   Third Trimester  [ ] Glucose screening-> next visit [ ] Monthly growth US  starting at 24 weeks then q 4w-> no imaging since anatomy scan. Ordered growth for next visit in 2 weeks. [ ] Weekly antenatal testing 34+0    Delivery timing  [ ] Delivery 39-40.6 weeks  [ ] SCDs with cesarean  [ ] 3-gram Cefazolin  with cesarean delivery    Postpartum  [ ] Consider lovenox x 6 weeks for BMI greater than 50

## 2024-06-28 ENCOUNTER — Ambulatory Visit: Admitting: Registered Nurse

## 2024-06-28 VITALS — BP 104/70 | HR 105 | Wt 248.7 lb

## 2024-06-28 DIAGNOSIS — O0990 Supervision of high risk pregnancy, unspecified, unspecified trimester: Secondary | ICD-10-CM

## 2024-06-28 DIAGNOSIS — Z113 Encounter for screening for infections with a predominantly sexual mode of transmission: Secondary | ICD-10-CM

## 2024-06-28 DIAGNOSIS — Z3A26 26 weeks gestation of pregnancy: Secondary | ICD-10-CM

## 2024-06-28 DIAGNOSIS — O99212 Obesity complicating pregnancy, second trimester: Secondary | ICD-10-CM

## 2024-06-28 DIAGNOSIS — Z131 Encounter for screening for diabetes mellitus: Secondary | ICD-10-CM

## 2024-06-28 DIAGNOSIS — E6609 Other obesity due to excess calories: Secondary | ICD-10-CM

## 2024-06-28 DIAGNOSIS — E669 Obesity, unspecified: Secondary | ICD-10-CM

## 2024-06-28 DIAGNOSIS — D649 Anemia, unspecified: Secondary | ICD-10-CM

## 2024-06-28 DIAGNOSIS — Z6841 Body Mass Index (BMI) 40.0 and over, adult: Secondary | ICD-10-CM

## 2024-06-28 NOTE — Assessment & Plan Note (Signed)
 BMI 40-59.99  Second Trimester  [x] Targeted US   [x] Begin ASA after 12 weeks   [ ] Anesthesia consult at 45 at NOB or if reaches 50 at any point in pregnancy-> currently 48.5   Third Trimester  [ ] Glucose screening-> next visit [ ] Monthly growth US  starting at 24 weeks then q 4w-> no imaging since anatomy scan. Ordered growth for next visit in 2 weeks. [ ] Weekly antenatal testing 34+0    Delivery timing  [ ] Delivery 39-40.6 weeks  [ ] SCDs with cesarean  [ ] 3-gram Cefazolin  with cesarean delivery    Postpartum  [ ] Consider lovenox x 6 weeks for BMI greater than 50

## 2024-07-09 ENCOUNTER — Other Ambulatory Visit

## 2024-07-09 DIAGNOSIS — E669 Obesity, unspecified: Secondary | ICD-10-CM | POA: Diagnosis not present

## 2024-07-09 DIAGNOSIS — E6609 Other obesity due to excess calories: Secondary | ICD-10-CM

## 2024-07-09 DIAGNOSIS — Z3A28 28 weeks gestation of pregnancy: Secondary | ICD-10-CM

## 2024-07-09 DIAGNOSIS — O99213 Obesity complicating pregnancy, third trimester: Secondary | ICD-10-CM | POA: Diagnosis not present

## 2024-07-12 ENCOUNTER — Ambulatory Visit: Admitting: Certified Nurse Midwife

## 2024-07-12 ENCOUNTER — Other Ambulatory Visit

## 2024-07-12 VITALS — BP 114/70 | HR 107 | Wt 251.6 lb

## 2024-07-12 DIAGNOSIS — E669 Obesity, unspecified: Secondary | ICD-10-CM

## 2024-07-12 DIAGNOSIS — O99213 Obesity complicating pregnancy, third trimester: Secondary | ICD-10-CM | POA: Diagnosis not present

## 2024-07-12 DIAGNOSIS — Z3483 Encounter for supervision of other normal pregnancy, third trimester: Secondary | ICD-10-CM

## 2024-07-12 DIAGNOSIS — Z131 Encounter for screening for diabetes mellitus: Secondary | ICD-10-CM

## 2024-07-12 DIAGNOSIS — O0990 Supervision of high risk pregnancy, unspecified, unspecified trimester: Secondary | ICD-10-CM

## 2024-07-12 DIAGNOSIS — E6609 Other obesity due to excess calories: Secondary | ICD-10-CM

## 2024-07-12 DIAGNOSIS — Z3A28 28 weeks gestation of pregnancy: Secondary | ICD-10-CM

## 2024-07-12 DIAGNOSIS — D649 Anemia, unspecified: Secondary | ICD-10-CM

## 2024-07-12 DIAGNOSIS — Z113 Encounter for screening for infections with a predominantly sexual mode of transmission: Secondary | ICD-10-CM

## 2024-07-12 NOTE — Progress Notes (Signed)
 ROB doing well, feeling good movement. U/s completed for growth . Preliminary results show growth 65.9%. glucose screen today. She declines Tdap. BTC and bilateral tubal consent completed today. Discussed MD visit 30-32 weeks to discuss repeat cesarean sections. She is in agreement. She is planning to try to breast feed with this pregnancy but notes she was not successful in the past. Reassurance given . Lactation services discussed.  Follow up 2 weeks for ROB.   Rebecca Walton, CNM  Ordering Provider: Lauraine Lakes, CNM  Indications:Growth/Obesity in Pregnancy  Findings:  Rebecca Walton intrauterine pregnancy is visualized with FHR at 139 bpm.   Rebecca Walton gives an (U/S) Gestational age of [redacted]w[redacted]d and an (U/S) EDD of 09/25/24; this does not correlate with the clinically established Estimated Date of Delivery: 10/03/24.   Fetal presentation is Cephalic.  Placenta: posterior.  MVP: 5.55 cm  Growth is in the  65.9th percentile   AC is in the 73.2th percentile. EFW: 1229g, 2lb 11oz  Impression: 1. [redacted]w[redacted]d Viable Single Intrauterine pregnancy dated by previously established criteria. 2. Growth is 65.9th percentile. MVP is 5.55 cm.  (Note: Exam was difficult to perform due to Maternal Body Habitus).  Recommendations: 1.Clinical correlation with the patient's History and Physical Exam. 2. Patient to be scanned at MFM due to body habitus.  Rebecca Walton, RDMS (AB,OB,BR),RVT

## 2024-07-13 LAB — 28 WEEK RH+PANEL
Basophils Absolute: 0 x10E3/uL (ref 0.0–0.2)
Basos: 0 %
EOS (ABSOLUTE): 0.1 x10E3/uL (ref 0.0–0.4)
Eos: 1 %
Gestational Diabetes Screen: 156 mg/dL — ABNORMAL HIGH (ref 70–139)
HIV Screen 4th Generation wRfx: NONREACTIVE
Hematocrit: 32.9 % — ABNORMAL LOW (ref 34.0–46.6)
Hemoglobin: 10.2 g/dL — ABNORMAL LOW (ref 11.1–15.9)
Immature Grans (Abs): 0.1 x10E3/uL (ref 0.0–0.1)
Immature Granulocytes: 1 %
Lymphocytes Absolute: 1.9 x10E3/uL (ref 0.7–3.1)
Lymphs: 19 %
MCH: 25 pg — ABNORMAL LOW (ref 26.6–33.0)
MCHC: 31 g/dL — ABNORMAL LOW (ref 31.5–35.7)
MCV: 81 fL (ref 79–97)
Monocytes Absolute: 0.6 x10E3/uL (ref 0.1–0.9)
Monocytes: 6 %
Neutrophils Absolute: 7.1 x10E3/uL — ABNORMAL HIGH (ref 1.4–7.0)
Neutrophils: 73 %
Platelets: 235 x10E3/uL (ref 150–450)
RBC: 4.08 x10E6/uL (ref 3.77–5.28)
RDW: 14.6 % (ref 11.7–15.4)
RPR Ser Ql: NONREACTIVE
WBC: 9.9 x10E3/uL (ref 3.4–10.8)

## 2024-07-14 ENCOUNTER — Ambulatory Visit: Payer: Self-pay | Admitting: Registered Nurse

## 2024-07-14 DIAGNOSIS — R7302 Impaired glucose tolerance (oral): Secondary | ICD-10-CM

## 2024-07-19 ENCOUNTER — Encounter: Admitting: Advanced Practice Midwife

## 2024-07-22 NOTE — Progress Notes (Signed)
" ° ° °  Return Prenatal Note   Subjective  34 y.o. H5E7987 at [redacted]w[redacted]d presents for this follow-up prenatal visit. Pregnancy notable for elevated BMI, prior CD x 2, short interval pregnancy, and rubella non-immune.   Patient missed her 3 hour glucose test but has rescheduled for 1/14.  1hGTT = 153. She has an anesthesia consults 1/13.  Follow up growth scan for BMI needed in two weeks, last done 12/23, 28wks, EFW 66%ile.   Patient reports: Movement: Present Contractions: Not present Denies vaginal bleeding or leaking fluid. Objective  Flow sheet Vitals: Pulse Rate: 97 BP: 103/77 Fundal Height: 32 cm Fetal Heart Rate (bpm): 141 Total weight gain: 23 lb 12.8 oz (10.8 kg)  General Appearance  No acute distress, well appearing, and well nourished Pulmonary   Normal work of breathing Neurologic   Alert and oriented to person, place, and time Psychiatric   Mood and affect within normal limits   Assessment/Plan   Plan  33 y.o. H5E7987 at [redacted]w[redacted]d by 6wk CRL presents for follow-up OB visit. Reviewed prenatal record including previous visit note.  1. Supervision of high risk pregnancy, antepartum (Primary) -1hr GTT 153 and has not completed 3hr -- scheduled for 1/14, emphasized importance -Tdap offered again today, declines  2. Other obesity due to excess calories affecting pregnancy in third trimester 3. BMI 45.0-49.9, adult (HCC) -Growth US  Q4wk -- needs to be with MFM, Val unable to get images needed; next due in 2wks, ordered -Anesthesia consult scheduled for 1/13 -Start weekly NSTs at 34wks -Slow weight gain, now 23#  4. H/O cesarean section w/short interval (prior 09/18/23) -Desires repeat w/BS, scheduled for 09/26/24; BTL consent completed 07/12/24 -Plan for Ligasure, clear drape, no long cord or ON-Q   Orders Placed This Encounter  Procedures   US  MFM OB FOLLOW UP    Standing Status:   Future    Expected Date:   08/05/2024    Expiration Date:   07/22/2025    Reason for Exam  (SYMPTOM  OR DIAGNOSIS REQUIRED):   growth/obesity in pregnancy    Preferred Location:   [facility]    Release to patient:   Immediate   Return for ROB 2 weeks; MFM growth ultrasound 2 weeks.   Future Appointments  Date Time Provider Department Center  07/30/2024 10:00 AM [facility] None  07/31/2024  8:00 AM [facility]GYN LAB [facility] None  08/08/2024  1:15 PM Charma Domino, CNM [facility] None   For next visit:  continue with routine prenatal care    Estil Mangle, DO [facility] "

## 2024-07-23 ENCOUNTER — Ambulatory Visit

## 2024-07-23 ENCOUNTER — Other Ambulatory Visit

## 2024-07-23 ENCOUNTER — Telehealth: Payer: Self-pay | Admitting: Registered Nurse

## 2024-07-23 NOTE — Telephone Encounter (Signed)
 Reached out to pt to reschedule 3 hr GTT that was scheduled on 07/24/2023 at 8:00.  Could not leave a message bc mailbox was full.

## 2024-07-23 NOTE — Patient Instructions (Incomplete)
  Vaginitis You will learn about the causes symptoms, and treatment for the 3 main types of vaginitis; vaginosis, yeast infection and trichomoniasis. To view the content, go to this web address: [email or web address]  This video will expire on: 06/28/2025. If you need access to this video following this date, please reach out to the healthcare provider who assigned it to you. This information is not intended to replace advice given to you by your health care provider. Make sure you discuss any questions you have with your health care provider. Elsevier Patient Education  2024 [employer].

## 2024-07-23 NOTE — Progress Notes (Deleted)
" ° ° °  NURSE VISIT NOTE  Subjective:    Patient ID: Rebecca Walton, female    DOB: [birth year], 34 y.o.   MRN: [id number]  HPI  Patient is a 34 y.o. [phone] female who presents for {pe vag discharge desc:315065} vaginal discharge for *** {gen duration:315003}. Denies abnormal vaginal bleeding or significant pelvic pain or fever. {Actions; denies/reports/admits to:19208} {UTI Symptoms:210800002}. Patient {has/denies:33800} history of known exposure to STD.   Objective:    LMP 12/05/2023    No results found for any visits on 07/23/24.  Assessment:   No diagnosis found.  {vaginitis type:315262}  Plan:   GC and chlamydia DNA  probe sent to lab. Treatment: {vaginitis tx:315263} ROV prn if symptoms persist or worsen.   Rollo JINNY Maxin, CMA  "

## 2024-07-24 ENCOUNTER — Encounter: Payer: Self-pay | Admitting: Registered Nurse

## 2024-07-24 NOTE — Telephone Encounter (Signed)
 Reached out to pt (2x) to reschedule 3 hr GTT test that was scheduled on 07/24/2023 at 8:00.  Could not leave a message bc mailbox was full.  Will send a MyChart letter to pt.

## 2024-07-25 ENCOUNTER — Ambulatory Visit: Admitting: Obstetrics

## 2024-07-25 VITALS — BP 103/77 | HR 97 | Wt 253.8 lb

## 2024-07-25 DIAGNOSIS — O99213 Obesity complicating pregnancy, third trimester: Secondary | ICD-10-CM | POA: Diagnosis not present

## 2024-07-25 DIAGNOSIS — Z98891 History of uterine scar from previous surgery: Secondary | ICD-10-CM

## 2024-07-25 DIAGNOSIS — Z3A29 29 weeks gestation of pregnancy: Secondary | ICD-10-CM | POA: Diagnosis not present

## 2024-07-25 DIAGNOSIS — Z6841 Body Mass Index (BMI) 40.0 and over, adult: Secondary | ICD-10-CM

## 2024-07-25 DIAGNOSIS — O34219 Maternal care for unspecified type scar from previous cesarean delivery: Secondary | ICD-10-CM

## 2024-07-25 DIAGNOSIS — O0990 Supervision of high risk pregnancy, unspecified, unspecified trimester: Secondary | ICD-10-CM

## 2024-07-25 DIAGNOSIS — E6609 Other obesity due to excess calories: Secondary | ICD-10-CM | POA: Diagnosis not present

## 2024-07-30 ENCOUNTER — Inpatient Hospital Stay: Admission: RE | Admit: 2024-07-30

## 2024-07-31 ENCOUNTER — Other Ambulatory Visit

## 2024-07-31 DIAGNOSIS — R7302 Impaired glucose tolerance (oral): Secondary | ICD-10-CM

## 2024-08-01 ENCOUNTER — Encounter
Admission: RE | Admit: 2024-08-01 | Discharge: 2024-08-01 | Disposition: A | Discharge status: Home / Self Care | Source: Ambulatory Visit | Attending: Anesthesiology | Admitting: Anesthesiology

## 2024-08-01 ENCOUNTER — Ambulatory Visit: Payer: Self-pay | Admitting: Registered Nurse

## 2024-08-01 ENCOUNTER — Other Ambulatory Visit: Payer: Self-pay | Admitting: Registered Nurse

## 2024-08-01 DIAGNOSIS — O24419 Gestational diabetes mellitus in pregnancy, unspecified control: Secondary | ICD-10-CM

## 2024-08-01 DIAGNOSIS — O24410 Gestational diabetes mellitus in pregnancy, diet controlled: Secondary | ICD-10-CM

## 2024-08-01 LAB — GESTATIONAL GLUCOSE TOLERANCE
Glucose, Fasting: 97 mg/dL — ABNORMAL HIGH (ref 70–94)
Glucose, GTT - 1 Hour: 188 mg/dL — ABNORMAL HIGH (ref 70–179)
Glucose, GTT - 2 Hour: 173 mg/dL — ABNORMAL HIGH (ref 70–154)
Glucose, GTT - 3 Hour: 177 mg/dL — ABNORMAL HIGH (ref 70–139)

## 2024-08-01 MED ORDER — BLOOD GLUCOSE TEST VI STRP: 1.0000 | ORAL_STRIP | Freq: Four times a day (QID) | 5 refills | Status: AC

## 2024-08-01 MED ORDER — LANCETS MISC. MISC: 1.0000 | Freq: Four times a day (QID) | 5 refills | Status: AC

## 2024-08-01 MED ORDER — LANCET DEVICE MISC: 1.0000 | Freq: Four times a day (QID) | 5 refills | Status: AC

## 2024-08-01 MED ORDER — BLOOD GLUCOSE MONITORING SUPPL DEVI: 1.0000 | Freq: Four times a day (QID) | 0 refills | Status: AC

## 2024-08-01 NOTE — Consult Note (Signed)
 "                  [facility] Anesthesia Consultation  Denette Hass FMW:[id number] DOB: 07-20-1990 DOA: 08/01/2024 PCP: Freddrick, No   Requesting physician: Estil Mangle, MD Date of consultation: 08/01/2024 Reason for consultation: Obesity during pregnancy  CHIEF COMPLAINT:  Obesity during pregnancy  HISTORY OF PRESENT ILLNESS: Kyren Vaux  is a 34 y.o. 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   PONV (postoperative nausea and vomiting)    Postpartum anemia 07/03/2017   Vaginal Pap smear, abnormal    cryo 2021    PAST SURGICAL HISTORY:  Past Surgical History:  Procedure Laterality Date   CESAREAN SECTION N/A 06/29/2017   Procedure: CESAREAN SECTION;  Surgeon: Leonce Garnette BIRCH, MD;  Location: [facility] ORS;  Service: Obstetrics;  Laterality: N/A;   CESAREAN SECTION N/A 09/18/2023   Procedure: REPEAT CESAREAN DELIVERY;  Surgeon: Janit Alm Agent, MD;  Location: [facility] ORS;  Service: Obstetrics;  Laterality: N/A;   CHOLECYSTECTOMY N/A 08/22/2017   Procedure: LAPAROSCOPIC CHOLECYSTECTOMY;  Surgeon: Nicholaus Selinda Birmingham, MD;  Location: [facility] ORS;  Service: General;  Laterality: N/A;    SOCIAL HISTORY:  Social History   Tobacco Use   Smoking status: Never   Smokeless tobacco: Never  Substance Use Topics   Alcohol use: Not Currently    Comment: last use 01/12/2024 beer    FAMILY HISTORY:  Family History  Problem Relation Age of Onset   Anemia Mother    Healthy Father    Asthma Sister    Anemia Sister    Healthy Sister    Healthy Brother     Healthy Maternal Grandmother    Diabetes Maternal Grandfather    Diabetes Paternal Grandmother    Healthy Paternal Grandfather    Epilepsy Maternal Aunt    Cerebral palsy Maternal Aunt     DRUG ALLERGIES: Allergies[1]  REVIEW OF SYSTEMS:   RESPIRATORY: No cough, shortness of breath, wheezing.  CARDIOVASCULAR: No chest pain, orthopnea, edema.  HEMATOLOGY: No anemia, easy bruising or bleeding SKIN: No rash or lesion. NEUROLOGIC: No tingling, numbness, weakness.  PSYCHIATRY: No anxiety or depression.   MEDICATIONS AT HOME:  Prior to Admission medications  Medication Sig Start Date End Date Taking? Authorizing Provider  aspirin  81 MG chewable tablet Chew 2 tablets by mouth daily.    [provider]  Ferrous Sulfate  (IRON ) 325 (65 Fe) MG TABS Take by mouth daily.    [provider]  Prenatal Vit-Fe Fumarate-FA (MULTIVITAMIN-PRENATAL) 27-0.8 MG TABS tablet Take 1 tablet by mouth daily at 12 noon.    [provider]      PHYSICAL EXAMINATION:   VITAL SIGNS: Last menstrual period 12/05/2023, not currently breastfeeding.  GENERAL:  34 y.o.-year-old patient no acute distress.  HEENT: Head atraumatic, normocephalic. Oropharynx and nasopharynx clear. MP 1, TM distance >3 cm, normal mouth opening. LUNGS: No use of accessory muscles of respiration.   EXTREMITIES: No pedal edema, cyanosis, or clubbing.  NEUROLOGIC: normal gait  PSYCHIATRIC: The patient is alert and oriented x 3.  SKIN: No obvious rash, lesion, or ulcer.    IMPRESSION AND PLAN:   Brynlie Daza  is a 34 y.o. female presenting with obesity during pregnancy. BMI is currently 50 at [redacted] weeks gestation.   We discussed analgesic options during labor including epidural analgesia. Discussed that in obesity there can be increased difficulty with epidural placement or even failure of successful epidural. We also discussed that even after successful epidural placement there is increased risk of catheter  migration out of the epidural space that would require catheter replacement. Discussed use of epidural vs spinal vs GA if cesarean delivery is required. Discussed increased risk of difficult intubation during pregnancy should an emergency cesarean delivery be required.   This patient is appropriate for anesthetic care at [facility] in [place].  We discussed the limitations of a community hospital including but not limited to staffing, imaging, medications, and blood products.  The patient is aware of these limitations.  All questions answered and concerns addressed.    Lendia Mae, MD     [1]  Allergies Allergen Reactions   Coconut Flavoring Agent (Non-Screening) Swelling    Throat swells and closes.   "

## 2024-08-07 DIAGNOSIS — O24419 Gestational diabetes mellitus in pregnancy, unspecified control: Secondary | ICD-10-CM | POA: Insufficient documentation

## 2024-08-07 NOTE — Progress Notes (Unsigned)
" ° ° °  Return Prenatal Note   Subjective   34 y.o. [id number] at [redacted]w[redacted]d presents for this follow-up prenatal visit.  Patient *** Patient reports:    Objective   Flow sheet Vitals:   Total weight gain: 23 lb 12.8 oz (10.8 kg)  General Appearance  No acute distress, well appearing, and well nourished Pulmonary   Normal work of breathing Neurologic   Alert and oriented to person, place, and time Psychiatric   Mood and affect within normal limits   Assessment/Plan   Plan  34 y.o. [id number] at [redacted]w[redacted]d presents for follow-up OB visit. Reviewed prenatal record including previous visit note.  No problem-specific Assessment & Plan notes found for this encounter.      No orders of the defined types were placed in this encounter.  No follow-ups on file.   Future Appointments  Date Time Provider Department Center  08/08/2024  1:15 PM Charma Domino, CNM [facility] None  09/05/2024  7:00 AM [facility] PROVIDER 1 [facility]  09/05/2024  7:30 AM [facility] US4 [facility]    For next visit:  {[id number]}     Domino Charma, CNM  08/07/24 3:34 PM   "

## 2024-08-08 ENCOUNTER — Encounter: Admitting: Registered Nurse

## 2024-08-08 DIAGNOSIS — Z3A32 32 weeks gestation of pregnancy: Secondary | ICD-10-CM

## 2024-08-08 DIAGNOSIS — O24419 Gestational diabetes mellitus in pregnancy, unspecified control: Secondary | ICD-10-CM

## 2024-08-08 NOTE — Assessment & Plan Note (Signed)
 BMI 40-59.99  Second Trimester  [x] Targeted US   [x] Begin ASA after 12 weeks   [x] Anesthesia consult at 45 at NOB or if reaches 50 at any point in pregnancy: scheduled for 07/31/23    Third Trimester  [X] Glucose screening: Dx GDM 1/15 [ ] Monthly growth US : Needs to be with MFM, Val unable due to habitus. Due now. Was ordered 1/8, but has not yet been scheduled. [ ] Weekly antenatal testing 34+0    Delivery timing  [ ] Delivery 39-40.6 weeks- Sched for 3/12 w/ Roby [ ] SCDs with cesarean  [ ] 3-gram Cefazolin  with cesarean delivery    Postpartum  [ ] Consider lovenox x 6 weeks for BMI greater than 50

## 2024-08-15 NOTE — Progress Notes (Signed)
" ° ° °  Return Prenatal Note   Subjective   34 y.o. H5E7987 at [redacted]w[redacted]d presents for this follow-up prenatal visit.   Patient did not bring her glucose log, was not aware she needed to write these readings down, did not bring her machine with her. Reports Fasting 80-90 2 hours after meals are  around 111, has been  over 120 twice    Patient reports:doing well, having some heartburn but manageable     Friend  Dorothy to watch her children  Movement: Present Contractions: Irritability  Objective   Flow sheet Vitals: Pulse Rate: (!) 103 BP: 102/62 Fundal Height: 36 cm Fetal Heart Rate (bpm): 155 Total weight gain: 23 lb 14.4 oz (10.8 kg)  General Appearance  No acute distress, well appearing, and well nourished Pulmonary   Normal work of breathing Neurologic   Alert and oriented to person, place, and time Psychiatric   Mood and affect within normal limits   Assessment/Plan   Plan  34 y.o. [id number] at [redacted]w[redacted]d presents for follow-up OB visit. Reviewed prenatal record including previous visit note.  Obesity affecting pregnancy -TWG23lbs, WNL  -takes walk around work   Gestational diabetes mellitus (GDM) affecting fourth pregnancy -Glucose log given, instructed to write every reading down and to bring log in to next visit -NST next visit  -Next US  2/19   Supervision of high risk pregnancy, antepartum -Her friend Naomie will watch her children while she is in the hospital -Has RLTCS with BTL scheduled  -Reviewed labor warning signs and expectations for birth. Instructed to call office or come to hospital with persistent headache, vision changes, regular contractions, leaking of fluid, decreased fetal movement or vaginal bleeding.       No orders of the defined types were placed in this encounter.  Return in about 1 week (around 08/23/2024) for ROB, NST.   Future Appointments  Date Time Provider Department Center  08/23/2024  2:15 PM [facility] ROOM [facility] None  08/23/2024  2:55  PM Jayne Harlene CROME, CNM [facility] None  09/05/2024  7:00 AM [facility] PROVIDER 1 [facility]  09/05/2024  7:30 AM [facility] US4 [facility]    For next visit:  ROB with NST     Janzen Sacks M Cherisa Brucker, CNM  01/30/265:14 PM  "

## 2024-08-16 ENCOUNTER — Ambulatory Visit (INDEPENDENT_AMBULATORY_CARE_PROVIDER_SITE_OTHER): Admitting: Licensed Practical Nurse

## 2024-08-16 ENCOUNTER — Encounter: Payer: Self-pay | Admitting: Licensed Practical Nurse

## 2024-08-16 VITALS — BP 102/62 | HR 103 | Wt 253.9 lb

## 2024-08-16 DIAGNOSIS — Z3A33 33 weeks gestation of pregnancy: Secondary | ICD-10-CM | POA: Diagnosis not present

## 2024-08-16 DIAGNOSIS — O0990 Supervision of high risk pregnancy, unspecified, unspecified trimester: Secondary | ICD-10-CM | POA: Diagnosis not present

## 2024-08-16 DIAGNOSIS — O24419 Gestational diabetes mellitus in pregnancy, unspecified control: Secondary | ICD-10-CM

## 2024-08-16 NOTE — Assessment & Plan Note (Signed)
-  TWG23lbs, WNL  -takes walk around work

## 2024-08-16 NOTE — Assessment & Plan Note (Signed)
-  Her friend Naomie will watch her children while she is in the hospital -Has RLTCS with BTL scheduled  -Reviewed labor warning signs and expectations for birth. Instructed to call office or come to hospital with persistent headache, vision changes, regular contractions, leaking of fluid, decreased fetal movement or vaginal bleeding.

## 2024-08-20 ENCOUNTER — Other Ambulatory Visit: Payer: Self-pay

## 2024-08-20 ENCOUNTER — Other Ambulatory Visit: Payer: Self-pay | Admitting: Licensed Practical Nurse

## 2024-08-20 ENCOUNTER — Encounter: Payer: Self-pay | Admitting: *Deleted

## 2024-08-20 ENCOUNTER — Observation Stay
Admission: EM | Admit: 2024-08-20 | Discharge: 2024-08-20 | Disposition: A | Discharge status: Home / Self Care | Source: Home / Self Care | Admitting: Obstetrics and Gynecology

## 2024-08-20 DIAGNOSIS — R002 Palpitations: Secondary | ICD-10-CM

## 2024-08-20 DIAGNOSIS — O09893 Supervision of other high risk pregnancies, third trimester: Secondary | ICD-10-CM

## 2024-08-20 LAB — URINALYSIS, ROUTINE W REFLEX MICROSCOPIC
Bilirubin Urine: NEGATIVE
Glucose, UA: NEGATIVE mg/dL
Ketones, ur: 20 mg/dL — AB
Nitrite: NEGATIVE
Protein, ur: NEGATIVE mg/dL
Specific Gravity, Urine: 1.015 (ref 1.005–1.030)
pH: 6 (ref 5.0–8.0)

## 2024-08-20 LAB — CBC
HCT: 32.2 % — ABNORMAL LOW (ref 36.0–46.0)
Hemoglobin: 9.8 g/dL — ABNORMAL LOW (ref 12.0–15.0)
MCH: 23.1 pg — ABNORMAL LOW (ref 26.0–34.0)
MCHC: 30.4 g/dL (ref 30.0–36.0)
MCV: 75.9 fL — ABNORMAL LOW (ref 80.0–100.0)
Platelets: 189 10*3/uL (ref 150–400)
RBC: 4.24 MIL/uL (ref 3.87–5.11)
RDW: 15.4 % (ref 11.5–15.5)
WBC: 10.5 10*3/uL (ref 4.0–10.5)
nRBC: 0 % (ref 0.0–0.2)

## 2024-08-20 LAB — BASIC METABOLIC PANEL WITH GFR
Anion gap: 13 (ref 5–15)
BUN: 7 mg/dL (ref 6–20)
CO2: 22 mmol/L (ref 22–32)
Calcium: 8.7 mg/dL — ABNORMAL LOW (ref 8.9–10.3)
Chloride: 102 mmol/L (ref 98–111)
Creatinine, Ser: 0.44 mg/dL (ref 0.44–1.00)
GFR, Estimated: >60 mL/min
Glucose, Bld: 130 mg/dL — ABNORMAL HIGH (ref 70–99)
Potassium: 4.2 mmol/L (ref 3.5–5.1)
Sodium: 137 mmol/L (ref 135–145)

## 2024-08-20 LAB — TROPONIN T, HIGH SENSITIVITY: Troponin T High Sensitivity: <6 ng/L (ref 0–19)

## 2024-08-20 MED ORDER — MAGNESIUM OXIDE -MG SUPPLEMENT 400 (240 MG) MG PO TABS
400.0000 mg | ORAL_TABLET | Freq: Once | ORAL | Status: DC
  Filled 2024-08-20: qty 1

## 2024-08-20 MED ORDER — ACETAMINOPHEN 500 MG PO TABS
1000.0000 mg | ORAL_TABLET | Freq: Once | ORAL | Status: AC
  Administered 2024-08-20: 1000 mg via ORAL
  Filled 2024-08-20: qty 2

## 2024-08-20 NOTE — ED Notes (Signed)
 Spoke to L&D nurse, confirmed they will see pt at this time.

## 2024-08-20 NOTE — OB Triage Note (Signed)
 Patient is a G4P2 at [redacted]w[redacted]d presenting to [facility] triage for headache, blurry vision, and dizziness. Patient reports positive fetal movement; denies vaginal bleeding/discharge and leaking of fluid. Patient stated she had been dizzy on and off all day. Reported at work, she developed blurry vision and had to call for help. Patient reported normotensive vital signs at work. Patient stated she developed a headache on the drive to the hospital; rates the pain a 6/10. Blood work and UA sent by ER. FREDRIK Cookey, CNM called and SBAR given.

## 2024-08-20 NOTE — ED Triage Notes (Signed)
 Pt ambulatory to triage.  Pt was walking and had episode of blurred vision and dizziness.  Pt is [redacted] weeks Pregnant.  No abd pain  no vag bleeding.  Pt reports h/a earlier tonight.  Pt has palpitations.  Pt alert  speech clear.

## 2024-08-20 NOTE — Progress Notes (Signed)
 Pt seen in L and D triage for dizziness, reported she tends to get chest tightness and palpitations, ECG NSR< outpatient  to cardiology made Jinnie Cookey, CNM  [facility] 08/20/24  5:50 AM

## 2024-08-20 NOTE — ED Provider Notes (Signed)
 EKG interpretation note  EKG ED ECG REPORT I, Artist MARLA Kerns, the attending physician, personally viewed and interpreted this ECG. Date: 01/09/2024 EKG Time: 0246 Rate: 120 Rhythm: Tachycardic sinus rhythm QRS Axis: normal Intervals: normal ST/T Wave abnormalities: normal Narrative Interpretation: Tachycardic sinus rhythm.  No evidence of acute ischemia   Kerns Artist MARLA, MD 08/20/24 [phone]

## 2024-08-20 NOTE — Discharge Summary (Signed)
 Physician Final Progress Note  Patient ID: Rebecca Walton MRN: [id number] DOB/AGE: Aug 18, 1990 34 y.o.  Admit date: 08/20/2024 Admitting provider: Heather Penton, MD Discharge date: 08/20/2024   Admission Diagnoses:  1) intrauterine pregnancy at [redacted]w[redacted]d  2) dizziness 3) Headache  Discharge Diagnoses:  Principal Problem:   Indication for care in labor and delivery, antepartum    History of Present Illness: The patient is a 34 y.o. female [phone] at [redacted]w[redacted]d who presents for dizziness and headache. While at work [place] had an episode of dizziness, she was walking down the hall towards a resident's room when her vision went blurry, she felt her hear rate increase at that time. She checked her blood sugar and it was 88. She periodically gets episodes of dizziness, but tonight it lasted longer which is why she came in.  Parthenia has also noticed a tight feeling in her chest from time to time. She did have this tightness this evening. She does have a hx of asthma, states it is well controlled.  On her way to the hospital she developed a headache, it is in the front mainly over her left eye, it is dull and rates it a 6/10. Denies hx of preeclampsia.  Denies any nasal congestion, fevers, cough. She did have a runny nose this evening.   Ludene has A1GDM, she has been monitoring her blood sugars, they have been normal.   Prior to coming to L and D triage Vernis was evaluated in the ED, she had a normal ECG and labs were collected.   Past Medical History:  Diagnosis Date   Anemia    Asthma    Calculus of gallbladder without cholecystitis without obstruction 08/07/2017   CIN II (cervical intraepithelial neoplasia II) 10/16/2019   05/22/2019- LSIL  10/07/19 Colpo showed CIN2  10/2019= Cyro  11/2020- pap WNL     History of methicillin resistant staphylococcus aureus (MRSA)    as a child on elbow   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   PONV (postoperative nausea and vomiting)    Postpartum anemia 07/03/2017    Vaginal Pap smear, abnormal    cryo 2021    Past Surgical History:  Procedure Laterality Date   CESAREAN SECTION N/A 06/29/2017   Procedure: CESAREAN SECTION;  Surgeon: Leonce Garnette BIRCH, MD;  Location: [facility] ORS;  Service: Obstetrics;  Laterality: N/A;   CESAREAN SECTION N/A 09/18/2023   Procedure: REPEAT CESAREAN DELIVERY;  Surgeon: Janit Alm Agent, MD;  Location: [facility] ORS;  Service: Obstetrics;  Laterality: N/A;   CHOLECYSTECTOMY N/A 08/22/2017   Procedure: LAPAROSCOPIC CHOLECYSTECTOMY;  Surgeon: Nicholaus Selinda Birmingham, MD;  Location: [facility] ORS;  Service: General;  Laterality: N/A;    Medications Ordered Prior to Encounter[1]  Allergies[2]  Social History   Socioeconomic History   Marital status: Married    Spouse name: Beverley   Number of children: 2   Years of education: 11   Highest education level: 10th grade  Occupational History   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  Tobacco Use   Smoking status: Never   Smokeless tobacco: Never  Vaping Use   Vaping status: Never Used  Substance and Sexual Activity   Alcohol use: Not Currently    Comment: last use 01/12/2024 beer   Drug use: Never   Sexual activity: Yes    Partners: Male    Birth control/protection: None    Comment: last condom use - 01/18/2024 hx ocp 5 yrs ago  Other Topics Concern   Not  on file  Social History Narrative   ** Merged History Encounter **       Social Drivers of Health   Tobacco Use: Low Risk (08/20/2024)   Patient History    Smoking Tobacco Use: Never    Smokeless Tobacco Use: Never    Passive Exposure: Not on file  Financial Resource Strain: Low Risk (01/26/2024)   Overall Financial Resource Strain (CARDIA)    Difficulty of Paying Living Expenses: Not hard at all  Food Insecurity: No Food Insecurity (01/26/2024)   Epic    Worried About Programme Researcher, Broadcasting/film/video in the Last Year: Never true    Ran Out of Food in the Last Year: Never true  Transportation Needs: No Transportation Needs  (01/26/2024)   Epic    Lack of Transportation (Medical): No    Lack of Transportation (Non-Medical): No  Physical Activity: Insufficiently Active (01/26/2024)   Exercise Vital Sign    Days of Exercise per Week: 1 day    Minutes of Exercise per Session: 20 min  Stress: No Stress Concern Present (01/26/2024)   [employer] of Occupational Health - Occupational Stress Questionnaire    Feeling of Stress: Not at all  Social Connections: Moderately Integrated (01/26/2024)   Social Connection and Isolation Panel    Frequency of Communication with Friends and Family: Three times a week    Frequency of Social Gatherings with Friends and Family: Three times a week    Attends Religious Services: More than 4 times per year    Active Member of Clubs or Organizations: No    Attends Banker Meetings: Not on file    Marital Status: Married  Intimate Partner Violence: Not At Risk (01/25/2024)   Epic    Fear of Current or Ex-Partner: No    Emotionally Abused: No    Physically Abused: No    Sexually Abused: No  Depression (PHQ2-9): Low Risk (01/25/2024)   Depression (PHQ2-9)    PHQ-2 Score: 0  Alcohol Screen: Low Risk (01/26/2024)   Alcohol Screen    Last Alcohol Screening Score (AUDIT): 1  Housing: Low Risk (01/26/2024)   Epic    Unable to Pay for Housing in the Last Year: No    Number of Times Moved in the Last Year: 1    Homeless in the Last Year: No  Utilities: Not At Risk (01/26/2024)   Epic    Threatened with loss of utilities: No  Health Literacy: Adequate Health Literacy (01/29/2024)   B1300 Health Literacy    Frequency of need for help with medical instructions: Rarely    Family History  Problem Relation Age of Onset   Anemia Mother    Healthy Father    Asthma Sister    Anemia Sister    Healthy Sister    Healthy Brother    Healthy Maternal Grandmother    Diabetes Maternal Grandfather    Diabetes Paternal Grandmother    Healthy Paternal Grandfather    Epilepsy  Maternal Aunt    Cerebral palsy Maternal Aunt      Review of Systems  Constitutional: Negative.   HENT:  Negative for congestion, sinus pain, sore throat and tinnitus.   Respiratory:  Negative for cough, shortness of breath and wheezing.   Cardiovascular:  Positive for chest pain and palpitations.  Gastrointestinal:  Negative for abdominal pain, nausea and vomiting.  Musculoskeletal: Negative.   Neurological:  Positive for dizziness and headaches.  Psychiatric/Behavioral: Negative.       Physical Exam:  BP 130/85 (BP Location: Left Arm)   Pulse (!) 109   Temp 97.9 F (36.6 C) (Oral)   Resp 20   Ht 5' (1.524 m)   Wt 113.4 kg   LMP 12/05/2023   SpO2 100%   BMI 48.82 kg/m   Physical Exam Constitutional:      Appearance: Normal appearance.  HENT:     Head: Normocephalic.     Mouth/Throat:     Mouth: Mucous membranes are moist.  Cardiovascular:     Rate and Rhythm: Normal rate and regular rhythm.     Heart sounds: Normal heart sounds.  Pulmonary:     Effort: Pulmonary effort is normal.     Breath sounds: Normal breath sounds.  Abdominal:     Comments: gravid  Musculoskeletal:     Cervical back: Normal range of motion.     Right lower leg: No edema.     Left lower leg: No edema.  Neurological:     General: No focal deficit present.     Mental Status: She is alert.  Skin:    General: Skin is warm and dry.  Psychiatric:        Mood and Affect: Mood normal.        Thought Content: Thought content normal.        Judgment: Judgment normal.   EFM: baseline 140, moderate variability, pos accel, neg decel  TOCO: occasional contraction   Consults: None  Significant Findings/ Diagnostic Studies: labs: UA positive ketones,  CBC    Component Value Date/Time   WBC 10.5 08/20/2024 0246   RBC 4.24 08/20/2024 0246   HGB 9.8 (L) 08/20/2024 0246   HGB 10.2 (L) 07/12/2024 1523   HCT 32.2 (L) 08/20/2024 0246   HCT 32.9 (L) 07/12/2024 1523   PLT 189 08/20/2024 0246   PLT  235 07/12/2024 1523   MCV 75.9 (L) 08/20/2024 0246   MCV 81 07/12/2024 1523   MCV 80 04/28/2012 1221   MCH 23.1 (L) 08/20/2024 0246   MCHC 30.4 08/20/2024 0246   RDW 15.4 08/20/2024 0246   RDW 14.6 07/12/2024 1523   RDW 14.0 04/28/2012 1221   LYMPHSABS 1.9 07/12/2024 1523   MONOABS 0.6 11/20/2020 2303   EOSABS 0.1 07/12/2024 1523   BASOSABS 0.0 07/12/2024 1523   CMP     Component Value Date/Time   NA 137 08/20/2024 0246   NA 142 04/28/2012 1221   K 4.2 08/20/2024 0246   K 4.1 04/28/2012 1221   CL 102 08/20/2024 0246   CL 106 04/28/2012 1221   CO2 22 08/20/2024 0246   CO2 29 04/28/2012 1221   GLUCOSE 130 (H) 08/20/2024 0246   GLUCOSE 82 04/28/2012 1221   BUN 7 08/20/2024 0246   BUN 8 04/28/2012 1221   CREATININE 0.44 08/20/2024 0246   CREATININE 0.67 04/28/2012 1221   CALCIUM 8.7 (L) 08/20/2024 0246   CALCIUM 9.6 04/28/2012 1221   PROT 7.3 02/21/2023 1829   ALBUMIN 3.6 02/21/2023 1829   AST 20 02/21/2023 1829   ALT 20 02/21/2023 1829   ALKPHOS 77 02/21/2023 1829   BILITOT 0.4 02/21/2023 1829   GFRNONAA >60 08/20/2024 0246   GFRNONAA >60 04/28/2012 1221     Procedures: RNST  Hospital Course: The patient was admitted to Labor and Delivery Triage for observation. She was given 1,000mg  Tylenol . She declined IV fluids so was given a pitcher of fluid. Her ECG showed NSR. Over time Quaniyah reported her headache came down to a 3/10,  she was offered Magnesium  Oxide to help bring the headache down more, she initially  accepted then declined stating the headache was continued to improve. Priscella reported overall she felt better and thinks she just needed to rest which she has been doing in triage. Her hgb was noted to be 9.8, Cissy reports she takes 1 Iron  tablet a day, recommended increasing her Iron  to twice a day. Given her normal ECG and benign exam, will refer to outpatient cardiology for chest tightness. Discussed there is no clear explanation for the dizziness, her UA suggests  she is dehydrate therefore increase fluid intake and be mindful of how she is using her body at work. Instructed to keep her Scheduled ROB on Friday and to bring her glucose log.   Discharge Condition: stable  Disposition: Discharge disposition: 01-Home or Self Care       Diet: Diabetic diet  Discharge Activity: Activity as tolerated   Allergies as of 08/20/2024       Reactions   Coconut Flavoring Agent (non-screening) Swelling   Throat swells and closes.        Medication List     TAKE these medications    aspirin  81 MG chewable tablet Chew 2 tablets by mouth daily.   Blood Glucose Monitoring Suppl Devi 1 each by Does not apply route in the morning, at noon, in the evening, and at bedtime. May substitute to any manufacturer covered by patient's insurance.   BLOOD GLUCOSE TEST STRIPS Strp 1 each by Does not apply route in the morning, at noon, in the evening, and at bedtime. May substitute to any manufacturer covered by patient's insurance.   Iron  325 (65 Fe) MG Tabs Take by mouth daily.   Lancet Device Misc 1 each by Does not apply route in the morning, at noon, in the evening, and at bedtime. May substitute to any manufacturer covered by patient's insurance.   Lancets Misc. Misc 1 each by Does not apply route in the morning, at noon, in the evening, and at bedtime. May substitute to any manufacturer covered by patient's insurance.   multivitamin-prenatal 27-0.8 MG Tabs tablet Take 1 tablet by mouth daily at 12 noon.         Signed: Danyla Wattley M Guillermina Shaft, CNM  08/20/2024, 5:24 AM    [1]  No current facility-administered medications on file prior to encounter.   Current Outpatient Medications on File Prior to Encounter  Medication Sig Dispense Refill   aspirin  81 MG chewable tablet Chew 2 tablets by mouth daily.     Blood Glucose Monitoring Suppl DEVI 1 each by Does not apply route in the morning, at noon, in the evening, and at bedtime. May substitute to any  manufacturer covered by patient's insurance. 1 each 0   Ferrous Sulfate  (IRON ) 325 (65 Fe) MG TABS Take by mouth daily.     Glucose Blood (BLOOD GLUCOSE TEST STRIPS) STRP 1 each by Does not apply route in the morning, at noon, in the evening, and at bedtime. May substitute to any manufacturer covered by patient's insurance. 120 each 5   Lancet Device MISC 1 each by Does not apply route in the morning, at noon, in the evening, and at bedtime. May substitute to any manufacturer covered by patient's insurance. 1 each 5   Lancets Misc. MISC 1 each by Does not apply route in the morning, at noon, in the evening, and at bedtime. May substitute to any manufacturer covered by patient's insurance. 100 each 5  Prenatal Vit-Fe Fumarate-FA (MULTIVITAMIN-PRENATAL) 27-0.8 MG TABS tablet Take 1 tablet by mouth daily at 12 noon.    [2]  Allergies Allergen Reactions   Coconut Flavoring Agent (Non-Screening) Swelling    Throat swells and closes.

## 2024-08-20 NOTE — Discharge Instructions (Signed)
 To prevent headache: Magnesium  500 mg daily B2 400 mg daily Coenzyme q10 150 mg daily

## 2024-08-23 ENCOUNTER — Other Ambulatory Visit

## 2024-08-23 ENCOUNTER — Encounter: Admitting: Certified Nurse Midwife

## 2024-08-23 DIAGNOSIS — O0990 Supervision of high risk pregnancy, unspecified, unspecified trimester: Secondary | ICD-10-CM

## 2024-08-23 DIAGNOSIS — O24419 Gestational diabetes mellitus in pregnancy, unspecified control: Secondary | ICD-10-CM

## 2024-08-23 DIAGNOSIS — Z3A34 34 weeks gestation of pregnancy: Secondary | ICD-10-CM

## 2024-09-05 ENCOUNTER — Ambulatory Visit

## 2024-09-05 ENCOUNTER — Other Ambulatory Visit

## 2024-09-05 DIAGNOSIS — O0990 Supervision of high risk pregnancy, unspecified, unspecified trimester: Secondary | ICD-10-CM

## 2024-09-26 ENCOUNTER — Inpatient Hospital Stay: Admit: 2024-09-26 | Admitting: Obstetrics

## 2024-09-26 SURGERY — Surgical Case
Anesthesia: Choice
